# Patient Record
Sex: Male | Born: 1985 | Race: Black or African American | Hispanic: No | Marital: Single | Smoking: Current every day smoker
Health system: Southern US, Community
[De-identification: ages and names within clinical notes are randomized; demographics above are authoritative.]

## PROBLEM LIST (undated history)

## (undated) ENCOUNTER — Emergency Department (HOSPITAL_COMMUNITY): Payer: Self-pay | Source: Home / Self Care

## (undated) DIAGNOSIS — W3400XA Accidental discharge from unspecified firearms or gun, initial encounter: Secondary | ICD-10-CM

---
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
## 2011-02-07 ENCOUNTER — Emergency Department (HOSPITAL_COMMUNITY)
Admission: EM | Admit: 2011-02-07 | Discharge: 2011-02-07 | Disposition: A | Discharge status: Home / Self Care | Payer: Self-pay | Attending: Emergency Medicine | Admitting: Emergency Medicine

## 2011-02-07 ENCOUNTER — Encounter (HOSPITAL_COMMUNITY): Payer: Self-pay | Admitting: Radiology

## 2011-02-07 ENCOUNTER — Emergency Department (HOSPITAL_COMMUNITY): Payer: Self-pay

## 2011-02-07 DIAGNOSIS — M79609 Pain in unspecified limb: Secondary | ICD-10-CM | POA: Insufficient documentation

## 2011-02-07 DIAGNOSIS — M7989 Other specified soft tissue disorders: Secondary | ICD-10-CM | POA: Insufficient documentation

## 2011-02-07 DIAGNOSIS — S0990XA Unspecified injury of head, initial encounter: Secondary | ICD-10-CM | POA: Insufficient documentation

## 2011-02-07 DIAGNOSIS — M542 Cervicalgia: Secondary | ICD-10-CM | POA: Insufficient documentation

## 2011-02-07 DIAGNOSIS — J45909 Unspecified asthma, uncomplicated: Secondary | ICD-10-CM | POA: Insufficient documentation

## 2011-12-28 ENCOUNTER — Inpatient Hospital Stay (HOSPITAL_COMMUNITY)
Admission: EM | Admit: 2011-12-28 | Discharge: 2011-12-30 | DRG: 958 | Disposition: A | Discharge status: Home / Self Care | Payer: Self-pay | Attending: Surgery | Admitting: Surgery

## 2011-12-28 ENCOUNTER — Encounter (HOSPITAL_COMMUNITY): Admission: EM | Disposition: A | Discharge status: Home / Self Care | Payer: Self-pay | Source: Home / Self Care

## 2011-12-28 ENCOUNTER — Inpatient Hospital Stay (HOSPITAL_COMMUNITY): Payer: Self-pay

## 2011-12-28 ENCOUNTER — Emergency Department (HOSPITAL_COMMUNITY): Payer: Self-pay

## 2011-12-28 ENCOUNTER — Encounter (HOSPITAL_COMMUNITY): Payer: Self-pay | Admitting: Emergency Medicine

## 2011-12-28 ENCOUNTER — Inpatient Hospital Stay (HOSPITAL_COMMUNITY): Payer: Self-pay | Admitting: Anesthesiology

## 2011-12-28 ENCOUNTER — Encounter (HOSPITAL_COMMUNITY): Payer: Self-pay | Admitting: Anesthesiology

## 2011-12-28 DIAGNOSIS — S36113A Laceration of liver, unspecified degree, initial encounter: Secondary | ICD-10-CM | POA: Diagnosis present

## 2011-12-28 DIAGNOSIS — S838X9A Sprain of other specified parts of unspecified knee, initial encounter: Secondary | ICD-10-CM | POA: Diagnosis present

## 2011-12-28 DIAGNOSIS — S81809A Unspecified open wound, unspecified lower leg, initial encounter: Secondary | ICD-10-CM

## 2011-12-28 DIAGNOSIS — S91009A Unspecified open wound, unspecified ankle, initial encounter: Secondary | ICD-10-CM

## 2011-12-28 DIAGNOSIS — W3400XA Accidental discharge from unspecified firearms or gun, initial encounter: Secondary | ICD-10-CM

## 2011-12-28 DIAGNOSIS — S82009B Unspecified fracture of unspecified patella, initial encounter for open fracture type I or II: Principal | ICD-10-CM | POA: Diagnosis present

## 2011-12-28 DIAGNOSIS — S82009A Unspecified fracture of unspecified patella, initial encounter for closed fracture: Secondary | ICD-10-CM | POA: Diagnosis present

## 2011-12-28 DIAGNOSIS — I959 Hypotension, unspecified: Secondary | ICD-10-CM | POA: Diagnosis present

## 2011-12-28 DIAGNOSIS — S21109A Unspecified open wound of unspecified front wall of thorax without penetration into thoracic cavity, initial encounter: Secondary | ICD-10-CM | POA: Diagnosis present

## 2011-12-28 DIAGNOSIS — J45909 Unspecified asthma, uncomplicated: Secondary | ICD-10-CM | POA: Diagnosis present

## 2011-12-28 DIAGNOSIS — F17200 Nicotine dependence, unspecified, uncomplicated: Secondary | ICD-10-CM | POA: Diagnosis present

## 2011-12-28 DIAGNOSIS — R000 Tachycardia, unspecified: Secondary | ICD-10-CM | POA: Diagnosis present

## 2011-12-28 DIAGNOSIS — S31139A Puncture wound of abdominal wall without foreign body, unspecified quadrant without penetration into peritoneal cavity, initial encounter: Secondary | ICD-10-CM

## 2011-12-28 DIAGNOSIS — S81839A Puncture wound without foreign body, unspecified lower leg, initial encounter: Secondary | ICD-10-CM

## 2011-12-28 DIAGNOSIS — S81009A Unspecified open wound, unspecified knee, initial encounter: Secondary | ICD-10-CM

## 2011-12-28 HISTORY — PX: IRRIGATION AND DEBRIDEMENT KNEE: SHX5185

## 2011-12-28 HISTORY — PX: ORIF PATELLA: SHX5033

## 2011-12-28 LAB — CBC
HCT: 44.9 % (ref 39.0–52.0)
Platelets: 192 10*3/uL (ref 150–400)
RBC: 4.46 MIL/uL (ref 4.22–5.81)
RDW: 13 % (ref 11.5–15.5)
WBC: 10 10*3/uL (ref 4.0–10.5)

## 2011-12-28 LAB — COMPREHENSIVE METABOLIC PANEL
Alkaline Phosphatase: 71 U/L (ref 39–117)
BUN: 11 mg/dL (ref 6–23)
Calcium: 9.4 mg/dL (ref 8.4–10.5)
Creatinine, Ser: 1.18 mg/dL (ref 0.50–1.35)
GFR calc Af Amer: >90 mL/min (ref >90)
Glucose, Bld: 134 mg/dL — ABNORMAL HIGH (ref 70–99)
Potassium: 2.7 mEq/L — CL (ref 3.5–5.1)
Total Protein: 7.9 g/dL (ref 6.0–8.3)

## 2011-12-28 LAB — HEMOGLOBIN AND HEMATOCRIT, BLOOD
HCT: 44.2 % (ref 39.0–52.0)
HCT: 48 % (ref 39.0–52.0)
Hemoglobin: 15.2 g/dL (ref 13.0–17.0)
Hemoglobin: 16 g/dL (ref 13.0–17.0)

## 2011-12-28 LAB — URINALYSIS, MICROSCOPIC ONLY
Hgb urine dipstick: NEGATIVE
Nitrite: NEGATIVE
Specific Gravity, Urine: 1.005 (ref 1.005–1.030)
Urobilinogen, UA: 0.2 mg/dL (ref 0.0–1.0)

## 2011-12-28 LAB — POCT I-STAT, CHEM 8
BUN: 10 mg/dL (ref 6–23)
Calcium, Ion: 1.11 mmol/L — ABNORMAL LOW (ref 1.12–1.32)
Creatinine, Ser: 1.3 mg/dL (ref 0.50–1.35)
Glucose, Bld: 133 mg/dL — ABNORMAL HIGH (ref 70–99)
TCO2: 14 mmol/L (ref 0–100)

## 2011-12-28 LAB — MRSA PCR SCREENING: MRSA by PCR: NEGATIVE

## 2011-12-28 SURGERY — LAPAROTOMY, EXPLORATORY
Anesthesia: General

## 2011-12-28 SURGERY — OPEN REDUCTION INTERNAL FIXATION (ORIF) PATELLA
Anesthesia: General | Site: Knee | Laterality: Right | Wound class: Clean

## 2011-12-28 MED ORDER — HYDROMORPHONE HCL PF 1 MG/ML IJ SOLN
1.0000 mg | INTRAMUSCULAR | Status: DC | PRN
  Administered 2011-12-28 (×4): 1 mg via INTRAVENOUS
  Administered 2011-12-28 (×2): 2 mg via INTRAVENOUS
  Administered 2011-12-29 (×4): 1 mg via INTRAVENOUS
  Filled 2011-12-28 (×6): qty 1
  Filled 2011-12-28 (×2): qty 2
  Filled 2011-12-28 (×2): qty 1

## 2011-12-28 MED ORDER — SODIUM CHLORIDE 0.9 % IJ SOLN
INTRAMUSCULAR | Status: AC
  Administered 2011-12-28: 13:00:00
  Filled 2011-12-28: qty 10

## 2011-12-28 MED ORDER — SODIUM CHLORIDE 0.9 % IV SOLN
10.0000 mg | INTRAVENOUS | Status: DC | PRN
  Administered 2011-12-28: 50 ug/min via INTRAVENOUS

## 2011-12-28 MED ORDER — ETOMIDATE 2 MG/ML IV SOLN
INTRAVENOUS | Status: AC
  Filled 2011-12-28: qty 20

## 2011-12-28 MED ORDER — IOHEXOL 300 MG/ML  SOLN
125.0000 mL | Freq: Once | INTRAMUSCULAR | Status: AC | PRN
  Administered 2011-12-28: 125 mL via INTRAVENOUS

## 2011-12-28 MED ORDER — ONDANSETRON HCL 4 MG PO TABS: 4.0000 mg | ORAL_TABLET | Freq: Four times a day (QID) | ORAL | Status: DC | PRN

## 2011-12-28 MED ORDER — ROCURONIUM BROMIDE 50 MG/5ML IV SOLN
INTRAVENOUS | Status: AC
  Filled 2011-12-28: qty 2

## 2011-12-28 MED ORDER — PHENYLEPHRINE HCL 10 MG/ML IJ SOLN
INTRAMUSCULAR | Status: DC | PRN
  Administered 2011-12-28 (×2): 80 ug via INTRAVENOUS

## 2011-12-28 MED ORDER — TETANUS-DIPHTH-ACELL PERTUSSIS 5-2.5-18.5 LF-MCG/0.5 IM SUSP
INTRAMUSCULAR | Status: AC
  Filled 2011-12-28: qty 0.5

## 2011-12-28 MED ORDER — CEFAZOLIN SODIUM 1-5 GM-% IV SOLN
1.0000 g | Freq: Four times a day (QID) | INTRAVENOUS | Status: AC
  Administered 2011-12-28 – 2011-12-29 (×3): 1 g via INTRAVENOUS
  Filled 2011-12-28 (×4): qty 50

## 2011-12-28 MED ORDER — LACTATED RINGERS IV SOLN
INTRAVENOUS | Status: DC | PRN
  Administered 2011-12-28 (×2): via INTRAVENOUS

## 2011-12-28 MED ORDER — LIDOCAINE HCL (CARDIAC) 20 MG/ML IV SOLN
INTRAVENOUS | Status: AC
  Filled 2011-12-28: qty 5

## 2011-12-28 MED ORDER — PANTOPRAZOLE SODIUM 40 MG IV SOLR
40.0000 mg | Freq: Every day | INTRAVENOUS | Status: DC
  Administered 2011-12-28: 40 mg via INTRAVENOUS
  Filled 2011-12-28 (×2): qty 40

## 2011-12-28 MED ORDER — ACETAMINOPHEN 10 MG/ML IV SOLN
INTRAVENOUS | Status: DC | PRN
  Administered 2011-12-28: 1000 mg via INTRAVENOUS

## 2011-12-28 MED ORDER — ONDANSETRON HCL 4 MG/2ML IJ SOLN: 4.0000 mg | Freq: Four times a day (QID) | INTRAMUSCULAR | Status: DC | PRN

## 2011-12-28 MED ORDER — LIDOCAINE VISCOUS 2 % MT SOLN
OROMUCOSAL | Status: AC
  Filled 2011-12-28: qty 15

## 2011-12-28 MED ORDER — CEFAZOLIN SODIUM 1-5 GM-% IV SOLN
INTRAVENOUS | Status: AC
  Filled 2011-12-28: qty 100

## 2011-12-28 MED ORDER — NICOTINE 14 MG/24HR TD PT24
14.0000 mg | MEDICATED_PATCH | Freq: Every day | TRANSDERMAL | Status: DC
  Administered 2011-12-28 – 2011-12-29 (×2): 14 mg via TRANSDERMAL
  Filled 2011-12-28 (×3): qty 1

## 2011-12-28 MED ORDER — CEFAZOLIN SODIUM 1-5 GM-% IV SOLN
1.0000 g | Freq: Three times a day (TID) | INTRAVENOUS | Status: DC
  Filled 2011-12-28 (×2): qty 50

## 2011-12-28 MED ORDER — LIDOCAINE HCL (CARDIAC) 20 MG/ML IV SOLN
INTRAVENOUS | Status: DC | PRN
  Administered 2011-12-28: 100 mg via INTRAVENOUS

## 2011-12-28 MED ORDER — HYDROMORPHONE HCL PF 1 MG/ML IJ SOLN: 0.2500 mg | INTRAMUSCULAR | Status: DC | PRN

## 2011-12-28 MED ORDER — FENTANYL CITRATE 0.05 MG/ML IJ SOLN
INTRAMUSCULAR | Status: DC | PRN
  Administered 2011-12-28: 50 ug via INTRAVENOUS
  Administered 2011-12-28: 200 ug via INTRAVENOUS

## 2011-12-28 MED ORDER — PROPOFOL 10 MG/ML IV EMUL
INTRAVENOUS | Status: DC | PRN
  Administered 2011-12-28: 200 mg via INTRAVENOUS

## 2011-12-28 MED ORDER — ONDANSETRON HCL 4 MG/2ML IJ SOLN: 4.0000 mg | Freq: Once | INTRAMUSCULAR | Status: DC | PRN

## 2011-12-28 MED ORDER — PANTOPRAZOLE SODIUM 40 MG PO TBEC: 40.0000 mg | DELAYED_RELEASE_TABLET | Freq: Every day | ORAL | Status: DC

## 2011-12-28 MED ORDER — MORPHINE SULFATE 10 MG/ML IJ SOLN
INTRAMUSCULAR | Status: DC | PRN
  Administered 2011-12-28: 2 mg via INTRAVENOUS
  Administered 2011-12-28: 4 mg via INTRAVENOUS

## 2011-12-28 MED ORDER — SUCCINYLCHOLINE CHLORIDE 20 MG/ML IJ SOLN
INTRAMUSCULAR | Status: DC | PRN
  Administered 2011-12-28: 10 mg via INTRAVENOUS

## 2011-12-28 MED ORDER — SUCCINYLCHOLINE CHLORIDE 20 MG/ML IJ SOLN
INTRAMUSCULAR | Status: AC
  Filled 2011-12-28: qty 10

## 2011-12-28 MED ORDER — SODIUM CHLORIDE 0.9 % IR SOLN
Status: DC | PRN
  Administered 2011-12-28: 1000 mL

## 2011-12-28 SURGICAL SUPPLY — 74 items
BANDAGE ESMARK 6X9 LF (GAUZE/BANDAGES/DRESSINGS) ×1 IMPLANT
BANDAGE GAUZE ELAST BULKY 4 IN (GAUZE/BANDAGES/DRESSINGS) ×3 IMPLANT
BLADE CUDA 5.5 (BLADE) IMPLANT
BLADE GREAT WHITE 4.2 (BLADE) IMPLANT
BLADE SURG 10 STRL SS (BLADE) ×3 IMPLANT
BNDG CMPR 9X6 STRL LF SNTH (GAUZE/BANDAGES/DRESSINGS) ×2
BNDG CMPR MED 15X6 ELC VLCR LF (GAUZE/BANDAGES/DRESSINGS) ×2
BNDG COHESIVE 4X5 TAN STRL (GAUZE/BANDAGES/DRESSINGS) ×3 IMPLANT
BNDG COHESIVE 6X5 TAN STRL LF (GAUZE/BANDAGES/DRESSINGS) ×3 IMPLANT
BNDG ELASTIC 6X15 VLCR STRL LF (GAUZE/BANDAGES/DRESSINGS) ×3 IMPLANT
BNDG ESMARK 6X9 LF (GAUZE/BANDAGES/DRESSINGS) ×3
BUR OVAL 6.0 (BURR) IMPLANT
CHLORAPREP W/TINT 26ML (MISCELLANEOUS) ×3 IMPLANT
CLOTH BEACON ORANGE TIMEOUT ST (SAFETY) ×3 IMPLANT
COVER SURGICAL LIGHT HANDLE (MISCELLANEOUS) ×3 IMPLANT
CUFF TOURNIQUET SINGLE 34IN LL (TOURNIQUET CUFF) IMPLANT
CUFF TOURNIQUET SINGLE 44IN (TOURNIQUET CUFF) IMPLANT
DRAPE ARTHROSCOPY W/POUCH 114 (DRAPES) ×3 IMPLANT
DRAPE C-ARM 42X72 X-RAY (DRAPES) ×2 IMPLANT
DRAPE C-ARMOR (DRAPES) ×2 IMPLANT
DRAPE SURG 17X11 SM STRL (DRAPES) ×2 IMPLANT
DRAPE SURG 17X23 STRL (DRAPES) ×3 IMPLANT
DRAPE U-SHAPE 47X51 STRL (DRAPES) ×3 IMPLANT
DRSG ADAPTIC 3X8 NADH LF (GAUZE/BANDAGES/DRESSINGS) ×3 IMPLANT
DRSG EMULSION OIL 3X3 NADH (GAUZE/BANDAGES/DRESSINGS) ×3 IMPLANT
DRSG PAD ABDOMINAL 8X10 ST (GAUZE/BANDAGES/DRESSINGS) ×4 IMPLANT
ELECT REM PT RETURN 9FT ADLT (ELECTROSURGICAL) ×3
ELECTRODE REM PT RTRN 9FT ADLT (ELECTROSURGICAL) ×2 IMPLANT
GLOVE BIO SURGEON STRL SZ 6.5 (GLOVE) ×3 IMPLANT
GLOVE BIO SURGEON STRL SZ7 (GLOVE) ×3 IMPLANT
GLOVE BIO SURGEON STRL SZ7.5 (GLOVE) ×3 IMPLANT
GLOVE BIOGEL PI IND STRL 7.0 (GLOVE) ×2 IMPLANT
GLOVE BIOGEL PI IND STRL 7.5 (GLOVE) ×2 IMPLANT
GLOVE BIOGEL PI IND STRL 8 (GLOVE) ×2 IMPLANT
GLOVE BIOGEL PI INDICATOR 7.0 (GLOVE) ×1
GLOVE BIOGEL PI INDICATOR 7.5 (GLOVE) ×1
GLOVE BIOGEL PI INDICATOR 8 (GLOVE) ×1
GOWN PREVENTION PLUS LG XLONG (DISPOSABLE) ×3 IMPLANT
GOWN PREVENTION PLUS XLARGE (GOWN DISPOSABLE) ×3 IMPLANT
GOWN STRL NON-REIN LRG LVL3 (GOWN DISPOSABLE) ×6 IMPLANT
HANDPIECE INTERPULSE COAX TIP (DISPOSABLE) ×3
IMMOBILIZER KNEE 22 UNIV (SOFTGOODS) ×2 IMPLANT
IMMOBILIZER KNEE 24 THIGH 36 (MISCELLANEOUS) IMPLANT
IMMOBILIZER KNEE 24 UNIV (MISCELLANEOUS)
KIT BASIN OR (CUSTOM PROCEDURE TRAY) ×3 IMPLANT
KIT ROOM TURNOVER OR (KITS) ×3 IMPLANT
MANIFOLD NEPTUNE II (INSTRUMENTS) ×3 IMPLANT
NDL 18GX1X1/2 (RX/OR ONLY) (NEEDLE) ×1 IMPLANT
NEEDLE 18GX1X1/2 (RX/OR ONLY) (NEEDLE) ×3 IMPLANT
NS IRRIG 1000ML POUR BTL (IV SOLUTION) ×3 IMPLANT
PACK ARTHROSCOPY DSU (CUSTOM PROCEDURE TRAY) ×3 IMPLANT
PACK ORTHO EXTREMITY (CUSTOM PROCEDURE TRAY) ×3 IMPLANT
PAD ARMBOARD 7.5X6 YLW CONV (MISCELLANEOUS) ×6 IMPLANT
PADDING CAST COTTON 6X4 STRL (CAST SUPPLIES) ×3 IMPLANT
RETRIEVER SUT HEWSON (MISCELLANEOUS) ×2 IMPLANT
SET HNDPC FAN SPRY TIP SCT (DISPOSABLE) ×2 IMPLANT
SPONGE GAUZE 4X4 12PLY (GAUZE/BANDAGES/DRESSINGS) ×3 IMPLANT
SPONGE LAP 18X18 X RAY DECT (DISPOSABLE) ×6 IMPLANT
SPONGE LAP 4X18 X RAY DECT (DISPOSABLE) ×3 IMPLANT
STAPLER VISISTAT 35W (STAPLE) ×5 IMPLANT
STOCKINETTE IMPERVIOUS 9X36 MD (GAUZE/BANDAGES/DRESSINGS) ×3 IMPLANT
SUT ETHILON 4 0 PS 2 18 (SUTURE) ×3 IMPLANT
SUT VIC AB 0 CT1 27 (SUTURE) ×3
SUT VIC AB 0 CT1 27XBRD ANBCTR (SUTURE) ×1 IMPLANT
SUT VIC AB 2-0 CT1 27 (SUTURE) ×3
SUT VIC AB 2-0 CT1 TAPERPNT 27 (SUTURE) ×1 IMPLANT
SYR 20CC LL (SYRINGE) ×3 IMPLANT
TOWEL OR 17X24 6PK STRL BLUE (TOWEL DISPOSABLE) ×6 IMPLANT
TOWEL OR 17X26 10 PK STRL BLUE (TOWEL DISPOSABLE) ×3 IMPLANT
TUBE ANAEROBIC SPECIMEN COL (MISCELLANEOUS) IMPLANT
TUBE CONNECTING 12X1/4 (SUCTIONS) ×3 IMPLANT
WAND 90 DEG TURBOVAC W/CORD (SURGICAL WAND) IMPLANT
WATER STERILE IRR 1000ML POUR (IV SOLUTION) ×3 IMPLANT
YANKAUER SUCT BULB TIP NO VENT (SUCTIONS) ×3 IMPLANT

## 2011-12-28 SURGICAL SUPPLY — 35 items
BLADE SURG ROTATE 9660 (MISCELLANEOUS) IMPLANT
CANISTER SUCTION 2500CC (MISCELLANEOUS) ×3 IMPLANT
CHLORAPREP W/TINT 26ML (MISCELLANEOUS) ×3 IMPLANT
CLOTH BEACON ORANGE TIMEOUT ST (SAFETY) ×3 IMPLANT
DRAPE LAPAROSCOPIC ABDOMINAL (DRAPES) ×3 IMPLANT
DRAPE UTILITY 15X26 W/TAPE STR (DRAPE) ×6 IMPLANT
DRAPE WARM FLUID 44X44 (DRAPE) ×3 IMPLANT
ELECT CAUTERY BLADE 6.4 (BLADE) IMPLANT
ELECT REM PT RETURN 9FT ADLT (ELECTROSURGICAL) ×2
ELECTRODE REM PT RTRN 9FT ADLT (ELECTROSURGICAL) ×2 IMPLANT
GLOVE EUDERMIC 7 POWDERFREE (GLOVE) ×3 IMPLANT
GOWN PREVENTION PLUS XLARGE (GOWN DISPOSABLE) ×3 IMPLANT
GOWN STRL NON-REIN LRG LVL3 (GOWN DISPOSABLE) ×6 IMPLANT
KIT BASIN OR (CUSTOM PROCEDURE TRAY) ×3 IMPLANT
KIT ROOM TURNOVER OR (KITS) ×3 IMPLANT
LIGASURE IMPACT 36 18CM CVD LR (INSTRUMENTS) IMPLANT
NS IRRIG 1000ML POUR BTL (IV SOLUTION) ×3 IMPLANT
PACK GENERAL/GYN (CUSTOM PROCEDURE TRAY) ×3 IMPLANT
PAD ARMBOARD 7.5X6 YLW CONV (MISCELLANEOUS) ×6 IMPLANT
SPECIMEN JAR X LARGE (MISCELLANEOUS) IMPLANT
SPONGE GAUZE 4X4 12PLY (GAUZE/BANDAGES/DRESSINGS) ×3 IMPLANT
SPONGE LAP 18X18 X RAY DECT (DISPOSABLE) IMPLANT
STAPLER VISISTAT 35W (STAPLE) ×3 IMPLANT
SUCTION POOLE TIP (SUCTIONS) IMPLANT
SUT PDS AB 1 TP1 96 (SUTURE) ×6 IMPLANT
SUT SILK 2 0 SH CR/8 (SUTURE) ×3 IMPLANT
SUT SILK 2 0 TIES 10X30 (SUTURE) ×3 IMPLANT
SUT SILK 3 0 SH CR/8 (SUTURE) ×3 IMPLANT
SUT SILK 3 0 TIES 10X30 (SUTURE) ×3 IMPLANT
SUT VIC AB 3-0 SH 18 (SUTURE) IMPLANT
TOWEL OR 17X24 6PK STRL BLUE (TOWEL DISPOSABLE) ×3 IMPLANT
TOWEL OR 17X26 10 PK STRL BLUE (TOWEL DISPOSABLE) ×3 IMPLANT
TRAY FOLEY CATH 14FRSI W/METER (CATHETERS) IMPLANT
WATER STERILE IRR 1000ML POUR (IV SOLUTION) ×3 IMPLANT
YANKAUER SUCT BULB TIP NO VENT (SUCTIONS) IMPLANT
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
## 2011-12-28 NOTE — Anesthesia Procedure Notes (Signed)
Procedure Name: Intubation Date/Time: 12/28/2011 7:48 AM Performed by: Carmela Rima Pre-anesthesia Checklist: Patient identified, Emergency Drugs available, Suction available, Patient being monitored and Timeout performed Patient Re-evaluated:Patient Re-evaluated prior to inductionOxygen Delivery Method: Circle system utilized Preoxygenation: Pre-oxygenation with 100% oxygen Intubation Type: IV induction, Rapid sequence and Cricoid Pressure applied Laryngoscope Size: Mac and 4 Grade View: Grade II Tube type: Oral Tube size: 7.5 mm Number of attempts: 2 Placement Confirmation: ETT inserted through vocal cords under direct vision,  breath sounds checked- equal and bilateral,  positive ETCO2 and CO2 detector Secured at: 23 cm Tube secured with: Tape Dental Injury: Teeth and Oropharynx as per pre-operative assessment

## 2011-12-28 NOTE — Anesthesia Postprocedure Evaluation (Signed)
  Anesthesia Post-op Note  Patient: Calvin [name]  Procedure(s) Performed: Procedure(s) (LRB): IRRIGATION AND DEBRIDEMENT KNEE (Right) OPEN REDUCTION INTERNAL (ORIF) FIXATION PATELLA (Right)  Patient Location: PACU  Anesthesia Type: General  Level of Consciousness: awake, alert  and oriented  Airway and Oxygen Therapy: Patient Spontanous Breathing  Post-op Pain: mild  Post-op Assessment: Post-op Vital signs reviewed and Patient's Cardiovascular Status Stable  Post-op Vital Signs: stable  Complications: No apparent anesthesia complications

## 2011-12-28 NOTE — ED Notes (Signed)
Dr. Lindie Spruce notified about critical potassium.
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
## 2011-12-28 NOTE — Transfer of Care (Signed)
Immediate Anesthesia Transfer of Care Note  Patient: Calvin [name]  Procedure(s) Performed: Procedure(s) (LRB): IRRIGATION AND DEBRIDEMENT KNEE (Right) OPEN REDUCTION INTERNAL (ORIF) FIXATION PATELLA (Right)  Patient Location: PACU  Anesthesia Type: General  Level of Consciousness: awake, alert  and oriented  Airway & Oxygen Therapy: Patient Spontanous Breathing and Patient connected to face mask oxygen  Post-op Assessment: Report given to PACU RN, Post -op Vital signs reviewed and stable and Patient moving all extremities  Post vital signs: Reviewed and stable  Complications: No apparent anesthesia complications

## 2011-12-28 NOTE — ED Notes (Signed)
Emergency Release for blood product administration.  Verbal order received by Dr. Lindie Spruce to infuse 2 units  If BP drops below 90 systolic.  Pt arrived in CT - Pressure dropped 88/70 and initiated administration of first unit of PRBC - verified by second nurse: Martie Lee, RN  Unit # [id number]

## 2011-12-28 NOTE — ED Notes (Signed)
Dr. Lindie Spruce at bedside and performing FAST

## 2011-12-28 NOTE — Preoperative (Signed)
Beta Blockers   Reason not to administer Beta Blockers:Not Applicable 

## 2011-12-28 NOTE — ED Notes (Addendum)
Bag #1 One pair of sneakers Bag #2 Jeans and boxer shorts Bag #3 1 T-Shirt Bag #4 $1.61, license, 1 pair white socks
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
## 2011-12-28 NOTE — ED Notes (Signed)
See trauma narrator 
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
## 2011-12-28 NOTE — ED Notes (Signed)
Emergency Release second unit - Verified by second nurse Ian Malkin, RN and release signed by Dr. Lindie Spruce

## 2011-12-29 ENCOUNTER — Encounter (HOSPITAL_COMMUNITY): Payer: Self-pay | Admitting: Orthopedic Surgery

## 2011-12-29 ENCOUNTER — Inpatient Hospital Stay (HOSPITAL_COMMUNITY): Payer: Self-pay

## 2011-12-29 DIAGNOSIS — S36113A Laceration of liver, unspecified degree, initial encounter: Secondary | ICD-10-CM | POA: Diagnosis present

## 2011-12-29 DIAGNOSIS — S82009A Unspecified fracture of unspecified patella, initial encounter for closed fracture: Secondary | ICD-10-CM | POA: Diagnosis present

## 2011-12-29 DIAGNOSIS — S21339A Puncture wound without foreign body of unspecified front wall of thorax with penetration into thoracic cavity, initial encounter: Secondary | ICD-10-CM

## 2011-12-29 DIAGNOSIS — W3400XA Accidental discharge from unspecified firearms or gun, initial encounter: Secondary | ICD-10-CM

## 2011-12-29 DIAGNOSIS — S81039A Puncture wound without foreign body, unspecified knee, initial encounter: Secondary | ICD-10-CM

## 2011-12-29 LAB — TYPE AND SCREEN
Antibody Screen: NEGATIVE
Unit division: 0
Unit division: 0
Unit division: 0

## 2011-12-29 LAB — CBC
MCH: 33.9 pg (ref 26.0–34.0)
MCHC: 35 g/dL (ref 30.0–36.0)
Platelets: 139 10*3/uL — ABNORMAL LOW (ref 150–400)

## 2011-12-29 LAB — BASIC METABOLIC PANEL
Calcium: 8.8 mg/dL (ref 8.4–10.5)
Creatinine, Ser: 0.87 mg/dL (ref 0.50–1.35)
GFR calc non Af Amer: >90 mL/min (ref >90)
Sodium: 133 mEq/L — ABNORMAL LOW (ref 135–145)

## 2011-12-29 MED ORDER — HYDROMORPHONE HCL PF 1 MG/ML IJ SOLN
1.0000 mg | INTRAMUSCULAR | Status: DC | PRN
  Administered 2011-12-29: 1 mg via INTRAVENOUS
  Filled 2011-12-29: qty 1

## 2011-12-29 MED ORDER — TRAMADOL HCL 50 MG PO TABS
100.0000 mg | ORAL_TABLET | Freq: Four times a day (QID) | ORAL | Status: DC
  Administered 2011-12-29 – 2011-12-30 (×3): 100 mg via ORAL
  Filled 2011-12-29 (×4): qty 2

## 2011-12-29 MED ORDER — SODIUM CHLORIDE 0.9 % IJ SOLN: 3.0000 mL | INTRAMUSCULAR | Status: DC | PRN

## 2011-12-29 MED ORDER — OXYCODONE HCL 5 MG PO TABS
5.0000 mg | ORAL_TABLET | ORAL | Status: DC | PRN
  Administered 2011-12-29 (×2): 15 mg via ORAL
  Administered 2011-12-29: 10 mg via ORAL
  Administered 2011-12-30: 15 mg via ORAL
  Filled 2011-12-29: qty 3
  Filled 2011-12-29: qty 2
  Filled 2011-12-29 (×2): qty 3

## 2011-12-29 MED ORDER — HYDROCODONE-ACETAMINOPHEN 5-325 MG PO TABS
1.0000 | ORAL_TABLET | ORAL | Status: DC | PRN
  Administered 2011-12-29: 2 via ORAL
  Filled 2011-12-29: qty 2
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
## 2011-12-29 NOTE — Progress Notes (Signed)
UR complete 
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
## 2011-12-30 LAB — CBC
Platelets: 131 10*3/uL — ABNORMAL LOW (ref 150–400)
RBC: 4.54 MIL/uL (ref 4.22–5.81)
RDW: 13.9 % (ref 11.5–15.5)
WBC: 11.3 10*3/uL — ABNORMAL HIGH (ref 4.0–10.5)

## 2011-12-30 LAB — DIFFERENTIAL
Basophils Absolute: 0 10*3/uL (ref 0.0–0.1)
Lymphocytes Relative: 10 % — ABNORMAL LOW (ref 12–46)
Lymphs Abs: 1.1 10*3/uL (ref 0.7–4.0)
Neutro Abs: 8.8 10*3/uL — ABNORMAL HIGH (ref 1.7–7.7)
Neutrophils Relative %: 78 % — ABNORMAL HIGH (ref 43–77)

## 2011-12-30 MED ORDER — TRAMADOL HCL 50 MG PO TABS: 100.0000 mg | ORAL_TABLET | Freq: Four times a day (QID) | ORAL | Status: AC

## 2011-12-30 MED ORDER — OXYCODONE-ACETAMINOPHEN 7.5-325 MG PO TABS: 1.0000 | ORAL_TABLET | ORAL | Status: AC | PRN
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
## 2012-01-12 ENCOUNTER — Emergency Department (INDEPENDENT_AMBULATORY_CARE_PROVIDER_SITE_OTHER)
Admission: EM | Admit: 2012-01-12 | Discharge: 2012-01-12 | Disposition: A | Discharge status: Home / Self Care | Payer: Self-pay | Source: Home / Self Care | Attending: Emergency Medicine | Admitting: Emergency Medicine

## 2012-01-12 ENCOUNTER — Encounter (HOSPITAL_COMMUNITY): Payer: Self-pay

## 2012-01-12 DIAGNOSIS — S81009A Unspecified open wound, unspecified knee, initial encounter: Secondary | ICD-10-CM

## 2012-01-12 DIAGNOSIS — S81809A Unspecified open wound, unspecified lower leg, initial encounter: Secondary | ICD-10-CM

## 2012-01-12 DIAGNOSIS — W3400XA Accidental discharge from unspecified firearms or gun, initial encounter: Secondary | ICD-10-CM
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
## 2012-01-12 NOTE — ED Notes (Signed)
15 staples intact over rt knee.  Skin edges well approximated, no redness, swelling or drainage noted.   Asking if we will numb the area to remove them. Dr Lorenz Coaster in to evaluate
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
## 2012-01-19 ENCOUNTER — Telehealth (HOSPITAL_COMMUNITY): Payer: Self-pay | Admitting: *Deleted

## 2012-01-19 NOTE — ED Notes (Signed)
Pt. called on VM @ 1052 and said he was supposed to f/u with Dr. Ave Filter but does not know his phone number. I called back and gave him the address and phone number for Dr. Jones Broom the orthopedist.  He asked if I was sure that was who he was supposed to see. While I accessed the chart, either the call was dropped or pt. hung up. I verified he was supposed to f/u with Dr. Ave Filter today. I called back twice and left a message to call. Vassie Moselle 01/19/2012

## 2012-02-23 ENCOUNTER — Ambulatory Visit: Payer: Self-pay | Attending: Orthopedic Surgery

## 2012-02-23 DIAGNOSIS — IMO0001 Reserved for inherently not codable concepts without codable children: Secondary | ICD-10-CM | POA: Insufficient documentation

## 2012-02-23 DIAGNOSIS — R262 Difficulty in walking, not elsewhere classified: Secondary | ICD-10-CM | POA: Insufficient documentation

## 2012-02-23 DIAGNOSIS — M25569 Pain in unspecified knee: Secondary | ICD-10-CM | POA: Insufficient documentation

## 2012-02-23 DIAGNOSIS — M25669 Stiffness of unspecified knee, not elsewhere classified: Secondary | ICD-10-CM | POA: Insufficient documentation

## 2012-03-04 ENCOUNTER — Ambulatory Visit: Payer: Self-pay | Admitting: Physical Therapy

## 2012-03-05 ENCOUNTER — Ambulatory Visit: Payer: Self-pay | Admitting: Physical Therapy

## 2012-03-12 ENCOUNTER — Ambulatory Visit: Payer: Self-pay

## 2012-03-16 ENCOUNTER — Ambulatory Visit: Payer: Self-pay

## 2012-03-17 ENCOUNTER — Ambulatory Visit: Payer: Self-pay | Admitting: Physical Therapy

## 2013-10-02 ENCOUNTER — Emergency Department (HOSPITAL_COMMUNITY): Payer: Self-pay

## 2013-10-02 ENCOUNTER — Emergency Department (HOSPITAL_COMMUNITY)
Admission: EM | Admit: 2013-10-02 | Discharge: 2013-10-02 | Disposition: A | Discharge status: Home / Self Care | Payer: Self-pay | Attending: Emergency Medicine | Admitting: Emergency Medicine

## 2013-10-02 ENCOUNTER — Encounter (HOSPITAL_COMMUNITY): Payer: Self-pay | Admitting: Emergency Medicine

## 2013-10-02 DIAGNOSIS — S0180XA Unspecified open wound of other part of head, initial encounter: Secondary | ICD-10-CM | POA: Insufficient documentation

## 2013-10-02 DIAGNOSIS — IMO0002 Reserved for concepts with insufficient information to code with codable children: Secondary | ICD-10-CM | POA: Insufficient documentation

## 2013-10-02 DIAGNOSIS — J45909 Unspecified asthma, uncomplicated: Secondary | ICD-10-CM | POA: Insufficient documentation

## 2013-10-02 DIAGNOSIS — S01111A Laceration without foreign body of right eyelid and periocular area, initial encounter: Secondary | ICD-10-CM

## 2013-10-02 DIAGNOSIS — S0181XA Laceration without foreign body of other part of head, initial encounter: Secondary | ICD-10-CM

## 2013-10-02 DIAGNOSIS — S01409A Unspecified open wound of unspecified cheek and temporomandibular area, initial encounter: Secondary | ICD-10-CM | POA: Insufficient documentation

## 2013-10-02 DIAGNOSIS — S0990XA Unspecified injury of head, initial encounter: Secondary | ICD-10-CM

## 2013-10-02 DIAGNOSIS — F17200 Nicotine dependence, unspecified, uncomplicated: Secondary | ICD-10-CM | POA: Insufficient documentation

## 2013-10-02 DIAGNOSIS — S0510XA Contusion of eyeball and orbital tissues, unspecified eye, initial encounter: Secondary | ICD-10-CM | POA: Insufficient documentation

## 2013-10-02 DIAGNOSIS — F1010 Alcohol abuse, uncomplicated: Secondary | ICD-10-CM | POA: Insufficient documentation

## 2013-10-02 LAB — CBG MONITORING, ED: Glucose-Capillary: 91 mg/dL (ref 70–99)

## 2013-10-02 MED ORDER — ACETAMINOPHEN 325 MG PO TABS
650.0000 mg | ORAL_TABLET | Freq: Once | ORAL | Status: DC
  Filled 2013-10-02: qty 2

## 2013-10-02 MED ORDER — TETANUS-DIPHTHERIA TOXOIDS TD 5-2 LFU IM INJ
0.5000 mL | INJECTION | Freq: Once | INTRAMUSCULAR | Status: DC
  Filled 2013-10-02: qty 0.5

## 2013-10-02 MED ORDER — HYDROCODONE-ACETAMINOPHEN 5-325 MG PO TABS: 1.0000 | ORAL_TABLET | ORAL | Status: DC | PRN

## 2013-10-02 NOTE — ED Notes (Signed)
Dr. Norlene Campbelltter desires no piv at present.
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
## 2013-10-02 NOTE — Discharge Instructions (Signed)
Keep wounds clean and dry - wash daily with gentle soap and water and keep covered Take Vicodin as needed for severe pain - Please be careful with this medication.  It can cause drowsiness.  Use caution while driving, operating machinery, drinking alcohol, or any other activities that may impair your physical or mental abilities.   Take Ibuprofen 600-800 mg every 6-8 hours as needed for pain  Use the RICE method - see below (apply cold compresses - this will help with pain and swelling)  Return to the emergency department if you develop any changing/worsening condition, confusion, severe headache, difficulty walking/speaking, weakness, abdominal pain, blood in your urine/stool, repeated vomiting, feeling very lethargic, or any other concerns (please read additional information regarding your condition below)     Head Injury, Adult You have received a head injury. It does not appear serious at this time. Headaches and vomiting are common following head injury. It should be easy to awaken from sleeping. Sometimes it is necessary for you to stay in the emergency department for a while for observation. Sometimes admission to the hospital may be needed. After injuries such as yours, most problems occur within the first 24 hours, but side effects may occur up to 7 10 days after the injury. It is important for you to carefully monitor your condition and contact your health care provider or seek immediate medical care if there is a change in your condition. WHAT ARE THE TYPES OF HEAD INJURIES? Head injuries can be as minor as a bump. Some head injuries can be more severe. More severe head injuries include:  A jarring injury to the brain (concussion).  A bruise of the brain (contusion). This mean there is bleeding in the brain that can cause swelling.  A cracked skull (skull fracture).  Bleeding in the brain that collects, clots, and forms a bump (hematoma). WHAT CAUSES A HEAD INJURY? A serious head  injury is most likely to happen to someone who is in a car wreck and is not wearing a seat belt. Other causes of major head injuries include bicycle or motorcycle accidents, sports injuries, and falls. HOW ARE HEAD INJURIES DIAGNOSED? A complete history of the event leading to the injury and your current symptoms will be helpful in diagnosing head injuries. Many times, pictures of the brain, such as CT or MRI are needed to see the extent of the injury. Often, an overnight hospital stay is necessary for observation.  WHEN SHOULD I SEEK IMMEDIATE MEDICAL CARE?  You should get help right away if:  You have confusion or drowsiness.  You feel sick to your stomach (nauseous) or have continued, forceful vomiting.  You have dizziness or unsteadiness that is getting worse.  You have severe, continued headaches not relieved by medicine. Only take over-the-counter or prescription medicines for pain, fever, or discomfort as directed by your health care provider.  You do not have normal function of the arms or legs or are unable to walk.  You notice changes in the black spots in the center of the colored part of your eye (pupil).  You have a clear or bloody fluid coming from your nose or ears.  You have a loss of vision. During the next 24 hours after the injury, you must stay with someone who can watch you for the warning signs. This person should contact local emergency services (911 in the [place].S[place] if you have seizures, you become unconscious, or you are unable to wake up. HOW CAN I PREVENT  A HEAD INJURY IN THE FUTURE? The most important factor for preventing major head injuries is avoiding motor vehicle accidents. To minimize the potential for damage to your head, it is crucial to wear seat belts while riding in motor vehicles. Wearing helmets while bike riding and playing collision sports (like football) is also helpful. Also, avoiding dangerous activities around the house will further help reduce your  risk of head injury.  WHEN CAN I RETURN TO NORMAL ACTIVITIES AND ATHLETICS? You should be reevaluated by your health care provider before returning to these activities. If you have any of the following symptoms, you should not return to activities or contact sports until 1 week after the symptoms have stopped:  Persistent headache.  Dizziness or vertigo.  Poor attention and concentration.  Confusion.  Memory problems.  Nausea or vomiting.  Fatigue or tire easily.  Irritability.  Intolerant of bright lights or loud noises.  Anxiety or depression.  Disturbed sleep. MAKE SURE YOU:   Understand these instructions.  Will watch your condition.  Will get help right away if you are not doing well or get worse. Document Released: 07/07/2005 Document Revised: 04/27/2013 Document Reviewed: 03/14/2013 [facility] Patient Information 2014 [place], [place].   Assault, General Assault includes any behavior, whether intentional or reckless, which results in bodily injury to another person and/or damage to property. Included in this would be any behavior, intentional or reckless, that by its nature would be understood (interpreted) by a reasonable person as intent to harm another person or to damage his/her property. Threats may be oral or written. They may be communicated through regular mail, computer, fax, or phone. These threats may be direct or implied. FORMS OF ASSAULT INCLUDE:  Physically assaulting a person. This includes physical threats to inflict physical harm as well as:  Slapping.  Hitting.  Poking.  Kicking.  Punching.  Pushing.  Arson.  Sabotage.  Equipment vandalism.  Damaging or destroying property.  Throwing or hitting objects.  Displaying a weapon or an object that appears to be a weapon in a threatening manner.  Carrying a firearm of any kind.  Using a weapon to harm someone.  Using greater physical size/strength to intimidate another.  Making  intimidating or threatening gestures.  Bullying.  Hazing.  Intimidating, threatening, hostile, or abusive language directed toward another person.  It communicates the intention to engage in violence against that person. And it leads a reasonable person to expect that violent behavior may occur.  Stalking another person. IF IT HAPPENS AGAIN:  Immediately call for emergency help (911 in [place]  If someone poses clear and immediate danger to you, seek legal authorities to have a protective or restraining order put in place.  Less threatening assaults can at least be reported to authorities. STEPS TO TAKE IF A SEXUAL ASSAULT HAS HAPPENED  Go to an area of safety. This may include a shelter or staying with a friend. Stay away from the area where you have been attacked. A large percentage of sexual assaults are caused by a friend, relative or associate.  If medications were given by your caregiver, take them as directed for the full length of time prescribed.  Only take over-the-counter or prescription medicines for pain, discomfort, or fever as directed by your caregiver.  If you have come in contact with a sexual disease, find out if you are to be tested again. If your caregiver is concerned about the HIV/AIDS virus, he/she may require you to have continued testing for several months.  For the protection of your privacy, test results can not be given over the phone. Make sure you receive the results of your test. If your test results are not back during your visit, make an appointment with your caregiver to find out the results. Do not assume everything is normal if you have not heard from your caregiver or the medical facility. It is important for you to follow up on all of your test results.  File appropriate papers with authorities. This is important in all assaults, even if it has occurred in a family or by a friend. SEEK MEDICAL CARE IF:  You have new problems because of your  injuries.  You have problems that may be because of the medicine you are taking, such as:  Rash.  Itching.  Swelling.  Trouble breathing.  You develop belly (abdominal) pain, feel sick to your stomach (nausea) or are vomiting.  You begin to run a temperature.  You need supportive care or referral to a rape crisis center. These are centers with trained personnel who can help you get through this ordeal. SEEK IMMEDIATE MEDICAL CARE IF:  You are afraid of being threatened, beaten, or abused. In [place]., call 911.  You receive new injuries related to abuse.  You develop severe pain in any area injured in the assault or have any change in your condition that concerns you.  You faint or lose consciousness.  You develop chest pain or shortness of breath. Document Released: 07/07/2005 Document Revised: 09/29/2011 Document Reviewed: 02/23/2008 [facility] Patient Information 2014 [place], [place].  Laceration Care, Adult A laceration is a cut or lesion that goes through all layers of the skin and into the tissue just beneath the skin. TREATMENT  Some lacerations may not require closure. Some lacerations may not be able to be closed due to an increased risk of infection. It is important to see your caregiver as soon as possible after an injury to minimize the risk of infection and maximize the opportunity for successful closure. If closure is appropriate, pain medicines may be given, if needed. The wound will be cleaned to help prevent infection. Your caregiver will use stitches (sutures), staples, wound glue (adhesive), or skin adhesive strips to repair the laceration. These tools bring the skin edges together to allow for faster healing and a better cosmetic outcome. However, all wounds will heal with a scar. Once the wound has healed, scarring can be minimized by covering the wound with sunscreen during the day for 1 full year. HOME CARE INSTRUCTIONS  For sutures or staples:  Keep the wound  clean and dry.  If you were given a bandage (dressing), you should change it at least once a day. Also, change the dressing if it becomes wet or dirty, or as directed by your caregiver.  Wash the wound with soap and water 2 times a day. Rinse the wound off with water to remove all soap. Pat the wound dry with a clean towel.  After cleaning, apply a thin layer of the antibiotic ointment as recommended by your caregiver. This will help prevent infection and keep the dressing from sticking.  You may shower as usual after the first 24 hours. Do not soak the wound in water until the sutures are removed.  Only take over-the-counter or prescription medicines for pain, discomfort, or fever as directed by your caregiver.  Get your sutures or staples removed as directed by your caregiver. For skin adhesive strips:  Keep the wound clean and dry.  Do not get the skin adhesive strips wet. You may bathe carefully, using caution to keep the wound dry.  If the wound gets wet, pat it dry with a clean towel.  Skin adhesive strips will fall off on their own. You may trim the strips as the wound heals. Do not remove skin adhesive strips that are still stuck to the wound. They will fall off in time. For wound adhesive:  You may briefly wet your wound in the shower or bath. Do not soak or scrub the wound. Do not swim. Avoid periods of heavy perspiration until the skin adhesive has fallen off on its own. After showering or bathing, gently pat the wound dry with a clean towel.  Do not apply liquid medicine, cream medicine, or ointment medicine to your wound while the skin adhesive is in place. This may loosen the film before your wound is healed.  If a dressing is placed over the wound, be careful not to apply tape directly over the skin adhesive. This may cause the adhesive to be pulled off before the wound is healed.  Avoid prolonged exposure to sunlight or tanning lamps while the skin adhesive is in place.  Exposure to ultraviolet light in the first year will darken the scar.  The skin adhesive will usually remain in place for 5 to 10 days, then naturally fall off the skin. Do not pick at the adhesive film. You may need a tetanus shot if:  You cannot remember when you had your last tetanus shot.  You have never had a tetanus shot. If you get a tetanus shot, your arm may swell, get red, and feel warm to the touch. This is common and not a problem. If you need a tetanus shot and you choose not to have one, there is a rare chance of getting tetanus. Sickness from tetanus can be serious. SEEK MEDICAL CARE IF:   You have redness, swelling, or increasing pain in the wound.  You see a red line that goes away from the wound.  You have yellowish-white fluid (pus) coming from the wound.  You have a fever.  You notice a bad smell coming from the wound or dressing.  Your wound breaks open before or after sutures have been removed.  You notice something coming out of the wound such as wood or glass.  Your wound is on your hand or foot and you cannot move a finger or toe. SEEK IMMEDIATE MEDICAL CARE IF:   Your pain is not controlled with prescribed medicine.  You have severe swelling around the wound causing pain and numbness or a change in color in your arm, hand, leg, or foot.  Your wound splits open and starts bleeding.  You have worsening numbness, weakness, or loss of function of any joint around or beyond the wound.  You develop painful lumps near the wound or on the skin anywhere on your body. MAKE SURE YOU:   Understand these instructions.  Will watch your condition.  Will get help right away if you are not doing well or get worse. Document Released: 07/07/2005 Document Revised: 09/29/2011 Document Reviewed: 12/31/2010 [facility] Patient Information 2014 [place], [place].  RICE: Routine Care for Injuries The routine care of many injuries includes Rest, Ice, Compression, and  Elevation (RICE). HOME CARE INSTRUCTIONS  Rest is needed to allow your body to heal. Routine activities can usually be resumed when comfortable. Injured tendons and bones can take up to 6 weeks to heal. Tendons are the  cord-like structures that attach muscle to bone.  Ice following an injury helps keep the swelling down and reduces pain.  Put ice in a plastic bag.  Place a towel between your skin and the bag.  Leave the ice on for 15-20 minutes, 03-04 times a day. Do this while awake, for the first 24 to 48 hours. After that, continue as directed by your caregiver.  Compression helps keep swelling down. It also gives support and helps with discomfort. If an elastic bandage has been applied, it should be removed and reapplied every 3 to 4 hours. It should not be applied tightly, but firmly enough to keep swelling down. Watch fingers or toes for swelling, bluish discoloration, coldness, numbness, or excessive pain. If any of these problems occur, remove the bandage and reapply loosely. Contact your caregiver if these problems continue.  Elevation helps reduce swelling and decreases pain. With extremities, such as the arms, hands, legs, and feet, the injured area should be placed near or above the level of the heart, if possible. SEEK IMMEDIATE MEDICAL CARE IF:  You have persistent pain and swelling.  You develop redness, numbness, or unexpected weakness.  Your symptoms are getting worse rather than improving after several days. These symptoms may indicate that further evaluation or further X-rays are needed. Sometimes, X-rays may not show a small broken bone (fracture) until 1 week or 10 days later. Make a follow-up appointment with your caregiver. Ask when your X-ray results will be ready. Make sure you get your X-ray results. Document Released: 10/19/2000 Document Revised: 09/29/2011 Document Reviewed: 12/06/2010 [facility] Patient Information 2014 [place], [place].   Emergency Department  Resource Guide 1) Find a Doctor and Pay Out of Pocket Although you won't have to find out who is covered by your insurance plan, it is a good idea to ask around and get recommendations. You will then need to call the office and see if the doctor you have chosen will accept you as a new patient and what types of options they offer for patients who are self-pay. Some doctors offer discounts or will set up payment plans for their patients who do not have insurance, but you will need to ask so you aren't surprised when you get to your appointment.  2) Contact Your Local Health Department Not all health departments have doctors that can see patients for sick visits, but many do, so it is worth a call to see if yours does. If you don't know where your local health department is, you can check in your phone book. The CDC also has a tool to help you locate your state's health department, and many state websites also have listings of all of their local health departments.  3) Find a Walk-in Clinic If your illness is not likely to be very severe or complicated, you may want to try a walk in clinic. These are popping up all over the country in pharmacies, drugstores, and shopping centers. They're usually staffed by nurse practitioners or physician assistants that have been trained to treat common illnesses and complaints. They're usually fairly quick and inexpensive. However, if you have serious medical issues or chronic medical problems, these are probably not your best option.  No Primary Care Doctor: - Call Health Connect at  [phone] - they can help you locate a primary care doctor that  accepts your insurance, provides certain services, etc. - Physician Referral Service- [phone]  Chronic Pain Problems: Organization         Address  Phone   Notes  [facility]  [phone] Patients need to be referred by their primary care doctor.   Medication Assistance: Organization          Address  Phone   Notes  [facility] [address] [place] [place], [place] [address] [phone] --Must be a resident of [facility] -- Must have NO insurance coverage whatsoever (no Medicaid/ Medicare, etc.) -- The pt. MUST have a primary care doctor that directs their care regularly and follows them in the community   [facility]  [phone]   [employer]  [phone]    Agencies that provide inexpensive medical care: Organization         Address  Phone   Notes  [facility]  [phone]   [facility]    [phone]   [facility] [address] [place], [place] [address] [phone]   [facility] [address] [place] [phone]   Planned Parenthood    [phone]   [facility]    [phone]   [facility]  [address], [place] Phone:  [phone], Fax:  [phone] Hours of Operation:  9 am - 6 pm, M-F.  Also accepts Medicaid/Medicare and self-pay.  [facility]  [address], [place] Phone: [phone], Fax: [phone]. Hours of Operation:  8:30 am - 5:30 pm, M-F.  Also accepts Medicaid and self-pay.  [facility] [place] [address] [place] Phone: [phone]   [facility] [address] Natasha Bence [place], [place] [phone], Ext. [phone] Mondays & Thursdays: 7-9 AM.  First 15 patients are seen on a first come, first serve basis.    Medicaid-accepting [facility] Providers:  Organization         Address  Phone   Notes  [facility] [address], [place] [phone] Also accepts self-pay patients.  [facility] [address] Laurell Josephs [place], [place]  [phone]   [facility] [address], [place] [phone]   [facility] [address] [place] [phone]   Renaye Rakers [address], [place]   [phone] Only accepts [place] patients after they have their name applied to their card.   Self-Pay (no insurance) in [facility]:  Organization         Address  Phone   Notes  Sickle Cell Patients, [facility] [address] [place], [place] [phone]   [facility] [address] [place], [place] [phone]   [facility] [place]  [address], [place] [phone]   [facility]/Dr. Osei-Bonsu  [address] [place] [address], [place] or [address], [place] [phone] Phone number for both [place] and [place] locations is the same.  [facility] [address] [place] [phone]   [facility] [address] [place] or [address] [phone]   [facility] [address], [place] [phone], phone; [phone], fax Sees patients 1st and 3rd Saturday of every month.  Must not qualify for public  or private insurance (i.e. Medicaid, Medicare, [place] Health Choice, Veterans' Benefits)  Household income should be no more than 200% of the poverty level The clinic cannot treat you if you are pregnant or think you are pregnant  Sexually transmitted diseases are not treated at the clinic.    Dental Care: Organization         Address  Phone  Notes  [facility] Department of [facility] [address] [place], [place] [phone] Accepts children up to age 56 who are enrolled in [place] or [place] Health Choice; pregnant women with a Medicaid card; and children who have applied for Medicaid or [place] Health Choice, but were declined, whose parents can pay a reduced fee at time of service.  [facility] Department of [facility]  [address], [place] [phone] Accepts children up to age 87 who are enrolled in [place] or [place] Health Choice; pregnant women with a Medicaid card; and children who have applied for Medicaid or [place] Health Choice, but were declined, whose parents can pay a reduced fee at time of service.  [facility]  [address] [place], [place] [phone] Patients are seen by appointment only. Walk-ins are not accepted. [facility] will see patients 38 years of age and older. Monday - Tuesday (8am-5pm) Most Wednesdays (8:30-5pm) $30 per visit, cash only  [facility]  [address], [facility] [phone] Patients are seen by appointment only. Walk-ins are not accepted. [facility] will see patients 6 years of age and older. One Wednesday Evening (Monthly: Volunteer Based).  $30 per visit, cash only  [employer]  [phone] for adults; Children under age 59, call Graduate Pediatric Dentistry at [phone]. Children aged 38-14, please call [phone] to request a pediatric application.  Dental services are provided in all areas of dental care including fillings, crowns and bridges, complete and partial dentures, implants, gum treatment, root canals, and extractions. Preventive care is also provided. Treatment is provided to both adults and children. Patients are selected via a lottery and there is often a waiting list.   [facility] [address], [place]  [phone] [email or web address]   [facility] [address] [place], [place] [phone], Ext. [phone] Second and Fourth Thursday of each month, opens at 6:30 AM; Clinic ends at 9 AM.  Patients are seen on a first-come first-served basis, and a limited number are seen during each clinic.   [facility]  [address] Ether Griffins [place], [place] [phone]   Eligibility Requirements You must  have lived in [place], [place], or [place] counties for at least the last three months.   You cannot be eligible for state or federal sponsored [place], including [employer], [place], or [employer].   You generally cannot be eligible for healthcare insurance through your employer.    How to apply: Eligibility screenings are held every Tuesday and Wednesday afternoon from 1:00 pm until 4:00 pm. You do not need an appointment for the interview!  [facility] [address], [place], [place] [phone]   [facility] Health Department  [phone]   [facility] Health Department  [phone]   [facility] Health Department  [phone]    Behavioral Health Resources in the Community: Intensive Outpatient Programs Organization         Address  Phone  Notes  [facility] [address] [place], [place] [phone]   [facility] [address], [place], [place] [phone]   [facility] [address], [place], [place]  [phone]   [facility] Mental [facility] [address]  [place], [place] [phone] or [phone]   Substance Abuse Resources Organization         Address  Phone  Notes  [facility]  [phone]   [facility]  [phone]   [facility] [place]  [phone]   Floydene Flock  [phone]   Residential & Outpatient Substance Abuse Program  [phone]   Psychological Services Organization         Address  Phone  Notes  [facility] Behavioral Health  [phone]   [facility]  [phone]   [facility] Mental Health [address], [place] [phone] or [phone]    Mobile Crisis Teams Organization         Address  Phone  Notes  [facility], Mobile Crisis Care Unit  [phone]   [facility]  [address] [place], [place] [phone]   Doristine Locks [address] [place] [phone]    Self-Help/Support Groups Organization         Address  Phone             Notes  [facility] - variety of support groups  [phone]- I7437963 Call for more information  Narcotics Anonymous (NA), [facility] [address], [employer] [place]  2 meetings at this location   Statistician         Address  Phone  Notes  [facility] [address],    [place]  [phone]   [facility]  [address] [place] [address], [place], [place] [phone]   [facility] [address] [place], [place] [phone] Admissions: 8am-3pm M-F  [facility] [address]    [place], [place] [phone]   [facility] [address] [place], [place], [place] [phone]   [facility] [address],  [place], [place] [phone]   [facility] - Intensive Outpatient [address], [place], [place] [phone]   [facility] ([facility] [address]  [place], [place] [phone] or [phone]   [facility] [address], [place], [place] [phone] Accepts Medicaid  [facility] [address]  [place] [phone] Substance Abuse/Addiction Treatment   [facility] Organization         Address  Phone  Notes  [facility]  [phone]   Angie Fava, PhD [address] Knack [place], [place]   [phone] or [phone]   [facility] Behavioral   [address] [place], [place] [phone]   [facility] [address] [place], [place] [phone] Insurance/Medicaid/sponsorship through [facility] [address]                                    [place], [place] [phone] Therapy/tele-psych/case  [facility] [address], [place] [phone]    Dr. Lolly Mustache  [phone]   [facility]  [facility]  Health Dept. 1) [address] [place] 2) [place] 3)  [place] [address], [place] [phone]  [phone]   [place] [phone] or [phone] (After Hours)

## 2013-10-02 NOTE — ED Provider Notes (Signed)
Medical screening examination/treatment/procedure(s) were performed by non-physician practitioner and as supervising physician I was immediately available for consultation/collaboration.   EKG Interpretation None       Olivia Mackielga M Kaulder Zahner, MD 10/02/13 1755

## 2013-10-02 NOTE — ED Notes (Signed)
Pt at CT

## 2013-10-09 ENCOUNTER — Emergency Department (HOSPITAL_COMMUNITY)
Admission: EM | Admit: 2013-10-09 | Discharge: 2013-10-09 | Disposition: A | Discharge status: Home / Self Care | Payer: Self-pay | Attending: Emergency Medicine | Admitting: Emergency Medicine

## 2013-10-09 ENCOUNTER — Encounter (HOSPITAL_COMMUNITY): Payer: Self-pay | Admitting: Emergency Medicine

## 2013-10-09 DIAGNOSIS — J45909 Unspecified asthma, uncomplicated: Secondary | ICD-10-CM | POA: Insufficient documentation

## 2013-10-09 DIAGNOSIS — Z4802 Encounter for removal of sutures: Secondary | ICD-10-CM | POA: Insufficient documentation

## 2013-10-09 DIAGNOSIS — Z87891 Personal history of nicotine dependence: Secondary | ICD-10-CM | POA: Insufficient documentation
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
## 2013-10-09 IMAGING — CR DG KNEE 1-2V PORT*R*
2 series · 2 of 2 positions shown · non-contrast
Comparison: None.

CLINICAL DATA: Postop

PORTABLE RIGHT KNEE - 1-2 VIEW

[AP]
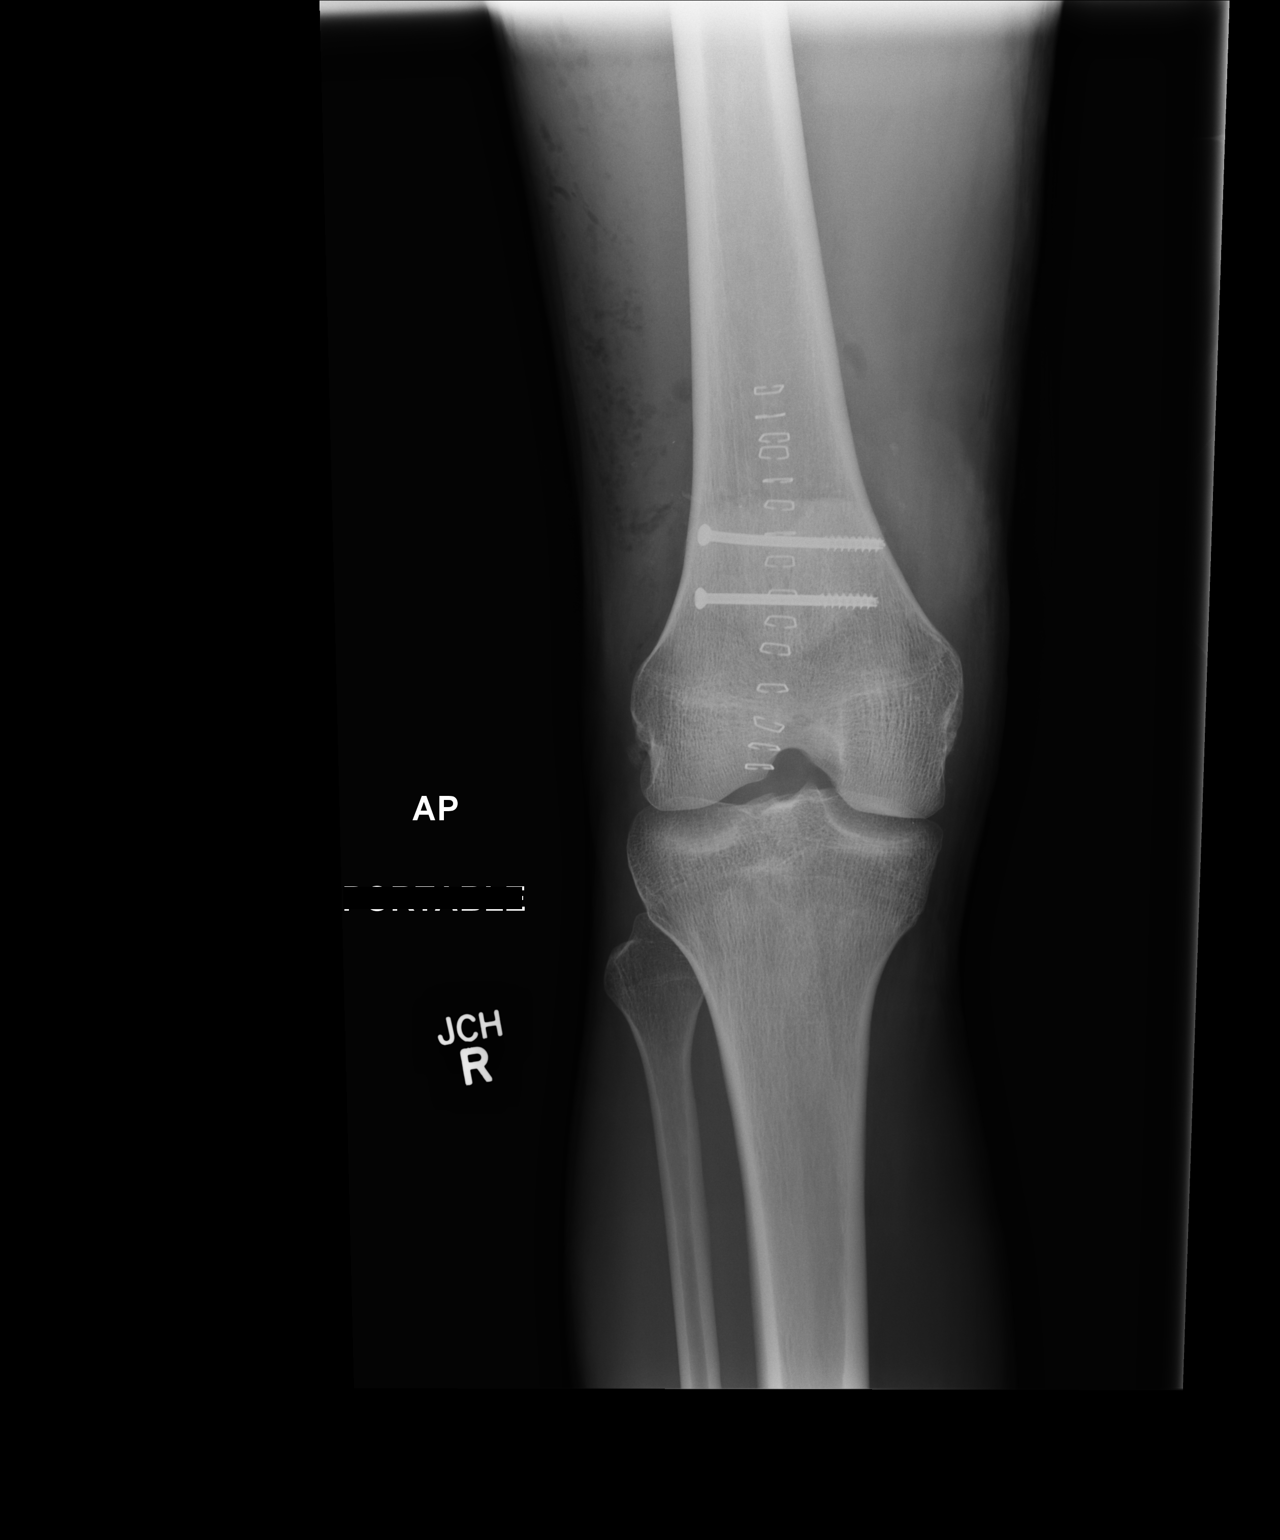

[xtable lateral]
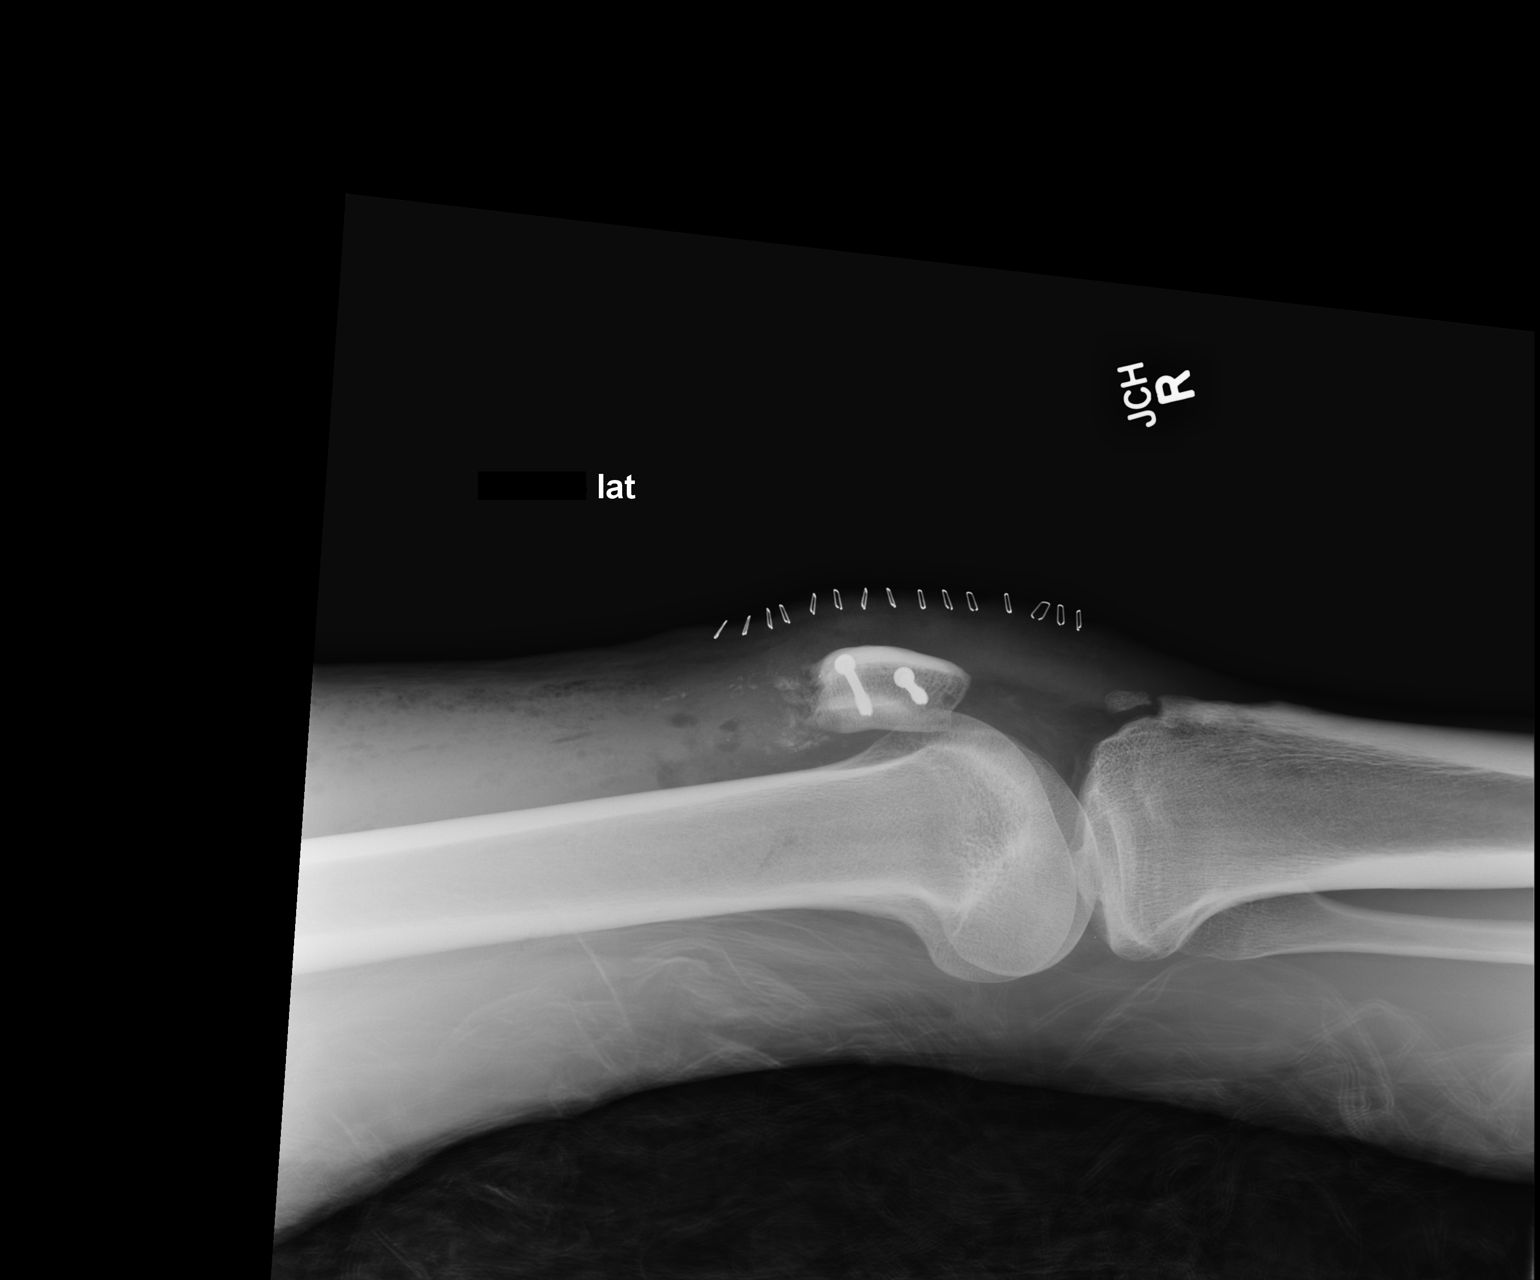

[2 of 2 positions shown; findings below may reference images not displayed]

FINDINGS: The patient is status post patellar fracture repair with
two metallic fixation screw across patella.  Midline skin staples
are noted.  Postsurgical changes with periarticular soft tissue
air.  There is a anatomic alignment.
IMPRESSION: Metallic fixation screws are noted in the right patella.  There is
anatomic alignment.  Midline skin staples are noted.  Postsurgical
changes are noted.
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
## 2013-10-09 NOTE — ED Provider Notes (Signed)
Medical screening examination/treatment/procedure(s) were performed by non-physician practitioner and as supervising physician I was immediately available for consultation/collaboration.   EKG Interpretation None        Junius ArgyleForrest S Odis Wickey, MD 10/09/13 1540

## 2013-10-09 NOTE — Discharge Instructions (Signed)
Suture Removal, Care After Refer to this sheet in the next few weeks. These instructions provide you with information on caring for yourself after your procedure. Your health care provider may also give you more specific instructions. Your treatment has been planned according to current medical practices, but problems sometimes occur. Call your health care provider if you have any problems or questions after your procedure. WHAT TO EXPECT AFTER THE PROCEDURE After your stitches (sutures) are removed, it is typical to have the following:  Some discomfort and swelling in the wound area.  Slight redness in the area. HOME CARE INSTRUCTIONS   If you have skin adhesive strips over the wound area, do not take the strips off. They will fall off on their own in a few days. If the strips remain in place after 14 days, you may remove them.  Change any bandages (dressings) at least once a day or as directed by your health care provider. If the bandage sticks, soak it off with warm, soapy water.  Apply cream or ointment only as directed by your health care provider. If using cream or ointment, wash the area with soap and water 2 times a day to remove all the cream or ointment. Rinse off the soap and pat the area dry with a clean towel.  Keep the wound area dry and clean. If the bandage becomes wet or dirty, or if it develops a bad smell, change it as soon as possible.  Continue to protect the wound from injury.  Use sunscreen when out in the sun. New scars become sunburned easily. SEEK MEDICAL CARE IF:  You have increasing redness, swelling, or pain in the wound.  You see pus coming from the wound.  You have a fever.  You notice a bad smell coming from the wound or dressing.  Your wound breaks open (edges not staying together). Document Released: 04/01/2001 Document Revised: 04/27/2013 Document Reviewed: 02/16/2013 ExitCare Patient Information 2014 [employer].  

## 2013-10-09 NOTE — ED Notes (Signed)
Pt reports here for suture removal. Pt has 4 sutures above right eye and 2 sutures below right eye placed 10/02/2013. Pt reports continues to have pain on right side of face.
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
## 2014-08-03 ENCOUNTER — Encounter (HOSPITAL_COMMUNITY): Payer: Self-pay | Admitting: Surgery

## 2016-07-21 ENCOUNTER — Ambulatory Visit (HOSPITAL_COMMUNITY)
Admission: EM | Admit: 2016-07-21 | Discharge: 2016-07-21 | Disposition: A | Discharge status: Home / Self Care | Payer: Self-pay | Attending: Family Medicine | Admitting: Family Medicine

## 2016-07-21 ENCOUNTER — Encounter (HOSPITAL_COMMUNITY): Payer: Self-pay | Admitting: Emergency Medicine

## 2016-07-21 DIAGNOSIS — K529 Noninfective gastroenteritis and colitis, unspecified: Secondary | ICD-10-CM

## 2016-07-21 DIAGNOSIS — R110 Nausea: Secondary | ICD-10-CM

## 2016-07-21 DIAGNOSIS — R197 Diarrhea, unspecified: Secondary | ICD-10-CM

## 2016-07-21 MED ORDER — ONDANSETRON 8 MG PO TBDP: 8.0000 mg | ORAL_TABLET | Freq: Three times a day (TID) | ORAL | 0 refills | Status: DC | PRN

## 2016-07-21 NOTE — ED Notes (Signed)
PT talking with visitor during triage.

## 2016-07-21 NOTE — ED Triage Notes (Signed)
PT reports vomiting for 2 days which resolved yesterday. PT still has diarrhea. PT had 4 episodes of diarrhea today. PT reports sore throat that started today.

## 2016-07-21 NOTE — ED Provider Notes (Signed)
CSN: [id number]     Arrival date & time 07/21/16  1636 History   First MD Initiated Contact with Patient 07/21/16 1805     Chief Complaint  Patient presents with  . Diarrhea   (Consider location/radiation/quality/duration/timing/severity/associated sxs/prior Treatment) Patient is here for c/o nausea and diarrhea for 2 days   The history is provided by the patient.  Diarrhea  Quality:  Watery Severity:  Moderate Onset quality:  Sudden Number of episodes:  4 Duration:  2 days Timing:  Constant Progression:  Worsening Relieved by:  Nothing Worsened by:  Nothing Ineffective treatments:  None tried Associated symptoms: fever     Past Medical History:  Diagnosis Date  . Asthma    Past Surgical History:  Procedure Laterality Date  . IRRIGATION AND DEBRIDEMENT KNEE  12/28/2011   Procedure: IRRIGATION AND DEBRIDEMENT KNEE;  Surgeon: Mable ParisJustin Olden Chandler, MD;  [facility];  Service: Orthopedics;  Laterality: Right;  . ORIF PATELLA  12/28/2011   Procedure: OPEN REDUCTION INTERNAL (ORIF) FIXATION PATELLA;  Surgeon: Mable ParisJustin Eian Chandler, MD;  [facility];  Service: Orthopedics;  Laterality: Right;   No family history on file. Social History  Substance Use Topics  . Smoking status: Former Smoker    Packs/day: 2.00    Quit date: 10/02/2013  . Smokeless tobacco: Not on file  . Alcohol use Yes     Comment: Stopped 10/02/2013    Review of Systems  Constitutional: Positive for fatigue and fever.  HENT: Negative.   Eyes: Negative.   Respiratory: Negative.   Cardiovascular: Negative.   Gastrointestinal: Positive for diarrhea.  Endocrine: Negative.   Genitourinary: Negative.   Musculoskeletal: Negative.   Skin: Negative.   Allergic/Immunologic: Negative.   Neurological: Negative.   Hematological: Negative.   Psychiatric/Behavioral: Negative.     Allergies  Tylenol [acetaminophen]  Home Medications   Prior to Admission medications   Medication Sig Start Date End  Date Taking? Authorizing Provider  ondansetron (ZOFRAN ODT) 8 MG disintegrating tablet Take 1 tablet (8 mg total) by mouth every 8 (eight) hours as needed for nausea or vomiting. 07/21/16   Deatra CanterWilliam J Darlin Stenseth, FNP   Meds Ordered and Administered this Visit  Medications - No data to display  BP 131/78   Pulse 66   Temp 97.7 F (36.5 C) (Oral)   Resp 16   Ht 6' (1.829 m)   SpO2 100%  No data found.   Physical Exam  Constitutional: He appears well-developed and well-nourished.  HENT:  Head: Normocephalic and atraumatic.  Right Ear: External ear normal.  Left Ear: External ear normal.  Mouth/Throat: Oropharynx is clear and moist.  Eyes: Conjunctivae and EOM are normal. Pupils are equal, round, and reactive to light.  Neck: Normal range of motion. Neck supple.  Cardiovascular: Normal rate, regular rhythm and normal heart sounds.   Pulmonary/Chest: Effort normal and breath sounds normal.  Abdominal: Soft. Bowel sounds are normal.  Nursing note and vitals reviewed.   Urgent Care Course   Clinical Course     Procedures (including critical care time)  Labs Review Labs Reviewed - No data to display  Imaging Review No results found.   Visual Acuity Review  Right Eye Distance:   Left Eye Distance:   Bilateral Distance:    Right Eye Near:   Left Eye Near:    Bilateral Near:         MDM   1. Gastroenteritis   2. Diarrhea, unspecified type   3. Nausea  Zofran 8mg  one po tid prn #20 Push po fluids, rest, tylenol and motrin otc prn as directed for fever, arthralgias, and myalgias.  Follow up prn if sx's continue or persist.    Deatra Canter, FNP 07/21/16 1816

## 2016-07-30 ENCOUNTER — Encounter (HOSPITAL_COMMUNITY): Payer: Self-pay | Admitting: Emergency Medicine

## 2016-07-30 ENCOUNTER — Emergency Department (HOSPITAL_COMMUNITY)
Admission: EM | Admit: 2016-07-30 | Discharge: 2016-07-30 | Disposition: A | Discharge status: Home / Self Care | Payer: Self-pay | Attending: Physician Assistant | Admitting: Physician Assistant

## 2016-07-30 DIAGNOSIS — Z87891 Personal history of nicotine dependence: Secondary | ICD-10-CM | POA: Insufficient documentation

## 2016-07-30 DIAGNOSIS — M546 Pain in thoracic spine: Secondary | ICD-10-CM | POA: Insufficient documentation

## 2016-07-30 DIAGNOSIS — J069 Acute upper respiratory infection, unspecified: Secondary | ICD-10-CM | POA: Insufficient documentation

## 2016-07-30 DIAGNOSIS — M549 Dorsalgia, unspecified: Secondary | ICD-10-CM

## 2016-07-30 DIAGNOSIS — J45909 Unspecified asthma, uncomplicated: Secondary | ICD-10-CM | POA: Insufficient documentation

## 2016-07-30 DIAGNOSIS — M545 Low back pain: Secondary | ICD-10-CM | POA: Insufficient documentation

## 2016-07-30 MED ORDER — CYCLOBENZAPRINE HCL 10 MG PO TABS: 10.0000 mg | ORAL_TABLET | Freq: Two times a day (BID) | ORAL | 0 refills | Status: DC | PRN

## 2016-07-30 NOTE — ED Triage Notes (Addendum)
Patient reports nasal congestion, productive cough, sore throat, and body aches x3 days. Patient also reports N/V/D x2 days ago that has resolved. Denies chest pain, shortness of breath, and abdominal pain. Ambulatory to triage.
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
## 2016-07-30 NOTE — Discharge Instructions (Signed)
Please read attached information. If you experience any new or worsening signs or symptoms please return to the emergency room for evaluation. Please follow-up with your primary care provider or specialist as discussed. Please use medication prescribed only as directed and discontinue taking if you have any concerning signs or symptoms.   °

## 2016-07-31 ENCOUNTER — Emergency Department (HOSPITAL_COMMUNITY)
Admission: EM | Admit: 2016-07-31 | Discharge: 2016-07-31 | Disposition: A | Discharge status: Home / Self Care | Payer: Self-pay | Attending: Emergency Medicine | Admitting: Emergency Medicine

## 2016-07-31 ENCOUNTER — Encounter (HOSPITAL_COMMUNITY): Payer: Self-pay | Admitting: Emergency Medicine

## 2016-07-31 DIAGNOSIS — M545 Low back pain, unspecified: Secondary | ICD-10-CM

## 2016-07-31 DIAGNOSIS — Z87891 Personal history of nicotine dependence: Secondary | ICD-10-CM | POA: Insufficient documentation

## 2016-07-31 DIAGNOSIS — J45909 Unspecified asthma, uncomplicated: Secondary | ICD-10-CM | POA: Insufficient documentation

## 2016-07-31 DIAGNOSIS — G8929 Other chronic pain: Secondary | ICD-10-CM | POA: Insufficient documentation

## 2016-07-31 DIAGNOSIS — Z79899 Other long term (current) drug therapy: Secondary | ICD-10-CM | POA: Insufficient documentation

## 2016-07-31 HISTORY — DX: Accidental discharge from unspecified firearms or gun, initial encounter: W34.00XA

## 2016-07-31 MED ORDER — METHOCARBAMOL 500 MG PO TABS: 500.0000 mg | ORAL_TABLET | Freq: Two times a day (BID) | ORAL | 0 refills | Status: DC

## 2016-07-31 MED ORDER — LIDOCAINE 5 % EX PTCH: 1.0000 | MEDICATED_PATCH | CUTANEOUS | 0 refills | Status: DC

## 2016-07-31 MED ORDER — NAPROXEN 500 MG PO TABS
500.0000 mg | ORAL_TABLET | Freq: Two times a day (BID) | ORAL | 0 refills | Status: DC
Start: 2016-07-31 — End: 2016-10-09
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
## 2016-07-31 NOTE — Discharge Instructions (Signed)
Take it easy, but do not lay around too much as this may make any stiffness worse. Take 500 mg of naproxen every 12 hours or 800 mg of ibuprofen every 8 hours for the next 3 days. Take these medications with food to avoid upset stomach. Robaxin is a muscle relaxer and may help loosen stiff muscles. Do not take the Robaxin while driving or performing other dangerous activities. Be sure to perform the attached exercises starting with three times a week and working up to performing them daily. This is an essential part of preventing long term problems. Follow up with a primary care provider for any future management of these complaints.  No medication will take your pain away. There is no short term or short cut solution to this issue. The place to start for chronic management of this issue to secure a primary care provider.
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
## 2016-10-09 ENCOUNTER — Emergency Department (HOSPITAL_COMMUNITY)
Admission: EM | Admit: 2016-10-09 | Discharge: 2016-10-09 | Disposition: A | Discharge status: Home / Self Care | Payer: Self-pay | Attending: Emergency Medicine | Admitting: Emergency Medicine

## 2016-10-09 ENCOUNTER — Encounter (HOSPITAL_COMMUNITY): Payer: Self-pay | Admitting: Emergency Medicine

## 2016-10-09 DIAGNOSIS — G8929 Other chronic pain: Secondary | ICD-10-CM | POA: Insufficient documentation

## 2016-10-09 DIAGNOSIS — M25561 Pain in right knee: Secondary | ICD-10-CM | POA: Insufficient documentation

## 2016-10-09 DIAGNOSIS — J029 Acute pharyngitis, unspecified: Secondary | ICD-10-CM | POA: Insufficient documentation

## 2016-10-09 DIAGNOSIS — J028 Acute pharyngitis due to other specified organisms: Secondary | ICD-10-CM

## 2016-10-09 DIAGNOSIS — Z87891 Personal history of nicotine dependence: Secondary | ICD-10-CM | POA: Insufficient documentation

## 2016-10-09 DIAGNOSIS — J45909 Unspecified asthma, uncomplicated: Secondary | ICD-10-CM | POA: Insufficient documentation

## 2016-10-09 MED ORDER — METHOCARBAMOL 500 MG PO TABS
500.0000 mg | ORAL_TABLET | Freq: Once | ORAL | Status: AC
  Administered 2016-10-09: 500 mg via ORAL
  Filled 2016-10-09: qty 1

## 2016-10-09 MED ORDER — METHOCARBAMOL 500 MG PO TABS: 500.0000 mg | ORAL_TABLET | Freq: Three times a day (TID) | ORAL | 0 refills | Status: DC | PRN

## 2016-10-09 MED ORDER — IBUPROFEN 600 MG PO TABS: 600.0000 mg | ORAL_TABLET | Freq: Three times a day (TID) | ORAL | 0 refills | Status: DC | PRN

## 2016-10-09 MED ORDER — IBUPROFEN 400 MG PO TABS
600.0000 mg | ORAL_TABLET | Freq: Once | ORAL | Status: AC
  Administered 2016-10-09: 600 mg via ORAL
  Filled 2016-10-09: qty 1
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
## 2016-10-09 NOTE — ED Triage Notes (Signed)
Pt reports chronic right knee pain that has been worse over the past few months, also c/o sore throat and cold symptoms x4 days. resp e/u, pt ambulatory to room.

## 2016-10-09 NOTE — ED Notes (Signed)
ED Provider at bedside. 

## 2016-10-26 ENCOUNTER — Emergency Department (HOSPITAL_COMMUNITY): Payer: Self-pay

## 2016-10-26 ENCOUNTER — Emergency Department (HOSPITAL_COMMUNITY)
Admission: EM | Admit: 2016-10-26 | Discharge: 2016-10-26 | Disposition: A | Discharge status: Home / Self Care | Payer: Self-pay | Attending: Emergency Medicine | Admitting: Emergency Medicine

## 2016-10-26 ENCOUNTER — Encounter (HOSPITAL_COMMUNITY): Payer: Self-pay

## 2016-10-26 DIAGNOSIS — Z87891 Personal history of nicotine dependence: Secondary | ICD-10-CM | POA: Insufficient documentation

## 2016-10-26 DIAGNOSIS — J45909 Unspecified asthma, uncomplicated: Secondary | ICD-10-CM | POA: Insufficient documentation

## 2016-10-26 DIAGNOSIS — M25562 Pain in left knee: Secondary | ICD-10-CM | POA: Insufficient documentation
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
## 2016-10-26 NOTE — ED Notes (Signed)
Pt returned from X-ray.  

## 2016-10-26 NOTE — ED Notes (Signed)
Pt in hurry to leave did not want discharge vitals done.

## 2016-10-26 NOTE — ED Triage Notes (Signed)
Pt complaining of soreness and swelling to L knee. Pt denies any new injury/trauma to L knee. Pt with no obvious swelling or redness to knee. Pt ambulatory at triage.

## 2016-10-26 NOTE — ED Notes (Signed)
Called for triage x 2, no response. 

## 2017-02-11 ENCOUNTER — Emergency Department (HOSPITAL_COMMUNITY): Admission: EM | Admit: 2017-02-11 | Discharge: 2017-02-11 | Discharge status: Against Medical Advice | Payer: Self-pay

## 2017-02-11 ENCOUNTER — Emergency Department (HOSPITAL_COMMUNITY)
Admission: EM | Admit: 2017-02-11 | Discharge: 2017-02-11 | Disposition: A | Discharge status: Home / Self Care | Payer: Self-pay

## 2017-02-11 NOTE — ED Notes (Signed)
Called for triage X 3, no answer 

## 2017-03-09 ENCOUNTER — Encounter (HOSPITAL_COMMUNITY): Payer: Self-pay | Admitting: Emergency Medicine

## 2017-03-09 DIAGNOSIS — M545 Low back pain: Secondary | ICD-10-CM | POA: Insufficient documentation

## 2017-03-09 DIAGNOSIS — Z5321 Procedure and treatment not carried out due to patient leaving prior to being seen by health care provider: Secondary | ICD-10-CM | POA: Insufficient documentation
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
## 2017-03-10 ENCOUNTER — Emergency Department (HOSPITAL_COMMUNITY)
Admission: EM | Admit: 2017-03-10 | Discharge: 2017-03-10 | Discharge status: Against Medical Advice | Payer: Self-pay | Attending: Emergency Medicine | Admitting: Emergency Medicine

## 2017-03-10 NOTE — ED Notes (Signed)
Pt called for room x3 with no answer. 

## 2017-03-10 NOTE — ED Notes (Signed)
Pt has been called in lobby without response

## 2017-03-23 DIAGNOSIS — M545 Low back pain: Secondary | ICD-10-CM | POA: Insufficient documentation

## 2017-03-23 DIAGNOSIS — Z5321 Procedure and treatment not carried out due to patient leaving prior to being seen by health care provider: Secondary | ICD-10-CM | POA: Insufficient documentation

## 2017-03-23 DIAGNOSIS — M25561 Pain in right knee: Secondary | ICD-10-CM | POA: Insufficient documentation

## 2017-03-24 ENCOUNTER — Emergency Department (HOSPITAL_COMMUNITY)
Admission: EM | Admit: 2017-03-24 | Discharge: 2017-03-24 | Disposition: A | Discharge status: Home / Self Care | Payer: Self-pay | Attending: Emergency Medicine | Admitting: Emergency Medicine

## 2017-03-24 ENCOUNTER — Encounter (HOSPITAL_COMMUNITY): Payer: Self-pay | Admitting: Emergency Medicine

## 2017-03-24 NOTE — ED Notes (Signed)
Called to take to room  No response from lobby  

## 2017-03-24 NOTE — ED Triage Notes (Signed)
Pt states he has chronic pain in his lower back and is his right knee  Pt states the pain in his back is sharp and his leg is stiff

## 2017-03-24 NOTE — ED Notes (Signed)
Called pt   No response from lobby  

## 2018-04-08 ENCOUNTER — Other Ambulatory Visit: Payer: Self-pay

## 2018-04-08 ENCOUNTER — Emergency Department (HOSPITAL_COMMUNITY): Payer: Self-pay

## 2018-04-08 ENCOUNTER — Encounter (HOSPITAL_COMMUNITY): Payer: Self-pay | Admitting: Emergency Medicine

## 2018-04-08 ENCOUNTER — Emergency Department (HOSPITAL_COMMUNITY)
Admission: EM | Admit: 2018-04-08 | Discharge: 2018-04-09 | Disposition: A | Discharge status: Home / Self Care | Payer: Self-pay | Attending: Emergency Medicine | Admitting: Emergency Medicine

## 2018-04-08 DIAGNOSIS — M545 Low back pain, unspecified: Secondary | ICD-10-CM

## 2018-04-08 DIAGNOSIS — F1290 Cannabis use, unspecified, uncomplicated: Secondary | ICD-10-CM | POA: Insufficient documentation

## 2018-04-08 DIAGNOSIS — F17210 Nicotine dependence, cigarettes, uncomplicated: Secondary | ICD-10-CM | POA: Insufficient documentation

## 2018-04-08 MED ORDER — KETOROLAC TROMETHAMINE 30 MG/ML IJ SOLN
30.0000 mg | Freq: Once | INTRAMUSCULAR | Status: AC
Start: 2018-04-08 — End: 2018-04-09
  Administered 2018-04-09: 30 mg via INTRAMUSCULAR
  Filled 2018-04-08: qty 1

## 2018-04-08 MED ORDER — METHOCARBAMOL 500 MG PO TABS
1000.0000 mg | ORAL_TABLET | Freq: Once | ORAL | Status: AC
Start: 2018-04-08 — End: 2018-04-09
  Administered 2018-04-09: 1000 mg via ORAL
  Filled 2018-04-08: qty 2

## 2018-04-08 MED ORDER — LIDOCAINE 5 % EX PTCH
1.0000 | MEDICATED_PATCH | CUTANEOUS | Status: DC
  Administered 2018-04-09: 1 via TRANSDERMAL
  Filled 2018-04-08: qty 1
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
## 2018-04-09 MED ORDER — METHOCARBAMOL 500 MG PO TABS: 500.0000 mg | ORAL_TABLET | Freq: Two times a day (BID) | ORAL | 0 refills | Status: DC

## 2018-04-09 MED ORDER — NAPROXEN 500 MG PO TABS: 500.0000 mg | ORAL_TABLET | Freq: Two times a day (BID) | ORAL | 0 refills | Status: DC
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
## 2018-04-09 NOTE — Discharge Instructions (Addendum)
X-ray looks good.  Please use Naprosyn twice daily and Robaxin as needed for back pain, do not take Robaxin before driving or operating machinery as this can cause drowsiness.  I would also like free to use over-the-counter salon pas cane patches, ice and heat.  Do not combine with any other over-the-counter pain relievers aside from Tylenol.   Looks like Dr. Jones BroomJustin Chandler did your knee surgery several years ago if you continue to have pain in your knee please follow-up with him.  Return to the emergency department if you have significantly worsening back pain, loss of bowel or bladder control, fevers, numbness or weakness in both legs or any other new or concerning symptoms.

## 2019-06-29 ENCOUNTER — Other Ambulatory Visit: Payer: Self-pay

## 2019-06-29 ENCOUNTER — Encounter (HOSPITAL_COMMUNITY): Payer: Self-pay

## 2019-06-29 ENCOUNTER — Ambulatory Visit (HOSPITAL_COMMUNITY)
Admission: EM | Admit: 2019-06-29 | Discharge: 2019-06-29 | Disposition: A | Discharge status: Home / Self Care | Payer: Self-pay | Attending: Emergency Medicine | Admitting: Emergency Medicine

## 2019-06-29 DIAGNOSIS — Z113 Encounter for screening for infections with a predominantly sexual mode of transmission: Secondary | ICD-10-CM | POA: Insufficient documentation

## 2019-06-29 DIAGNOSIS — R109 Unspecified abdominal pain: Secondary | ICD-10-CM

## 2019-06-29 DIAGNOSIS — R3915 Urgency of urination: Secondary | ICD-10-CM

## 2019-06-29 DIAGNOSIS — Z202 Contact with and (suspected) exposure to infections with a predominantly sexual mode of transmission: Secondary | ICD-10-CM | POA: Insufficient documentation

## 2019-06-29 LAB — HIV ANTIBODY (ROUTINE TESTING W REFLEX): HIV Screen 4th Generation wRfx: NONREACTIVE

## 2019-06-29 MED ORDER — CEFTRIAXONE SODIUM 250 MG IJ SOLR
250.0000 mg | Freq: Once | INTRAMUSCULAR | Status: AC
  Administered 2019-06-29: 15:00:00 250 mg via INTRAMUSCULAR

## 2019-06-29 MED ORDER — AZITHROMYCIN 250 MG PO TABS
ORAL_TABLET | ORAL | Status: AC
  Filled 2019-06-29: qty 4

## 2019-06-29 MED ORDER — AZITHROMYCIN 250 MG PO TABS
1000.0000 mg | ORAL_TABLET | Freq: Once | ORAL | Status: AC
  Administered 2019-06-29: 15:00:00 1000 mg via ORAL

## 2019-06-29 MED ORDER — CEFTRIAXONE SODIUM 250 MG IJ SOLR
INTRAMUSCULAR | Status: AC
  Filled 2019-06-29: qty 250
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
## 2019-06-29 NOTE — Discharge Instructions (Addendum)
Advised patient to practice safe sex Advised patient to return to urgent care or to follow-up with primary care if symptoms get worse Advised patient to review safe sex instruction that is attached
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
## 2019-06-30 LAB — SYPHILIS: RPR W/REFLEX TO RPR TITER AND TREPONEMAL ANTIBODIES, TRADITIONAL SCREENING AND DIAGNOSIS ALGORITHM: RPR Ser Ql: NONREACTIVE

## 2020-04-09 ENCOUNTER — Encounter (HOSPITAL_COMMUNITY): Payer: Self-pay | Admitting: Emergency Medicine

## 2020-04-09 ENCOUNTER — Other Ambulatory Visit: Payer: Self-pay

## 2020-04-09 ENCOUNTER — Ambulatory Visit (HOSPITAL_COMMUNITY)
Admission: EM | Admit: 2020-04-09 | Discharge: 2020-04-09 | Disposition: A | Discharge status: Home / Self Care | Payer: HRSA Program | Attending: Internal Medicine | Admitting: Internal Medicine

## 2020-04-09 DIAGNOSIS — Z791 Long term (current) use of non-steroidal anti-inflammatories (NSAID): Secondary | ICD-10-CM | POA: Insufficient documentation

## 2020-04-09 DIAGNOSIS — J208 Acute bronchitis due to other specified organisms: Secondary | ICD-10-CM

## 2020-04-09 DIAGNOSIS — J45909 Unspecified asthma, uncomplicated: Secondary | ICD-10-CM | POA: Diagnosis not present

## 2020-04-09 DIAGNOSIS — F17210 Nicotine dependence, cigarettes, uncomplicated: Secondary | ICD-10-CM | POA: Insufficient documentation

## 2020-04-09 DIAGNOSIS — U071 COVID-19: Secondary | ICD-10-CM | POA: Diagnosis not present

## 2020-04-09 DIAGNOSIS — R05 Cough: Secondary | ICD-10-CM | POA: Diagnosis present

## 2020-04-09 DIAGNOSIS — Z886 Allergy status to analgesic agent status: Secondary | ICD-10-CM | POA: Insufficient documentation

## 2020-04-09 MED ORDER — ALBUTEROL SULFATE HFA 108 (90 BASE) MCG/ACT IN AERS: 1.0000 | INHALATION_SPRAY | Freq: Four times a day (QID) | RESPIRATORY_TRACT | 0 refills | Status: DC | PRN

## 2020-04-09 MED ORDER — BENZONATATE 100 MG PO CAPS: 100.0000 mg | ORAL_CAPSULE | Freq: Three times a day (TID) | ORAL | 0 refills | Status: DC
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
## 2020-04-09 NOTE — ED Triage Notes (Signed)
Cough and shortness of breath for 1 week. Pt's girlfriend is covid positive.

## 2020-04-09 NOTE — Discharge Instructions (Signed)
Please quarantine with your family. If your symptoms worsen, please return to urgent care to be evaluated.

## 2020-04-10 ENCOUNTER — Telehealth (HOSPITAL_COMMUNITY): Payer: Self-pay

## 2020-04-10 LAB — SARS CORONAVIRUS 2 (TAT 6-24 HRS): SARS Coronavirus 2: POSITIVE — AB

## 2021-11-05 ENCOUNTER — Other Ambulatory Visit (HOSPITAL_COMMUNITY)
Admission: EM | Admit: 2021-11-05 | Discharge: 2021-11-07 | Disposition: A | Discharge status: Home / Self Care | Payer: No Payment, Other | Attending: Psychiatry | Admitting: Psychiatry

## 2021-11-05 DIAGNOSIS — F4310 Post-traumatic stress disorder, unspecified: Secondary | ICD-10-CM | POA: Insufficient documentation

## 2021-11-05 DIAGNOSIS — F1910 Other psychoactive substance abuse, uncomplicated: Secondary | ICD-10-CM | POA: Insufficient documentation

## 2021-11-05 DIAGNOSIS — F1010 Alcohol abuse, uncomplicated: Secondary | ICD-10-CM | POA: Insufficient documentation

## 2021-11-05 DIAGNOSIS — F329 Major depressive disorder, single episode, unspecified: Secondary | ICD-10-CM | POA: Insufficient documentation

## 2021-11-05 DIAGNOSIS — Z20822 Contact with and (suspected) exposure to covid-19: Secondary | ICD-10-CM | POA: Insufficient documentation

## 2021-11-05 DIAGNOSIS — F411 Generalized anxiety disorder: Secondary | ICD-10-CM | POA: Insufficient documentation

## 2021-11-05 DIAGNOSIS — F321 Major depressive disorder, single episode, moderate: Secondary | ICD-10-CM

## 2021-11-05 LAB — CBC WITH DIFFERENTIAL/PLATELET
Abs Immature Granulocytes: 0.02 10*3/uL (ref 0.00–0.07)
Basophils Absolute: 0 10*3/uL (ref 0.0–0.1)
Basophils Relative: 0 %
Eosinophils Absolute: 0.2 10*3/uL (ref 0.0–0.5)
Eosinophils Relative: 2 %
HCT: 47.7 % (ref 39.0–52.0)
Hemoglobin: 16.2 g/dL (ref 13.0–17.0)
Immature Granulocytes: 0 %
Lymphocytes Relative: 26 %
Lymphs Abs: 2.4 10*3/uL (ref 0.7–4.0)
MCH: 35 pg — ABNORMAL HIGH (ref 26.0–34.0)
MCHC: 34 g/dL (ref 30.0–36.0)
MCV: 103 fL — ABNORMAL HIGH (ref 80.0–100.0)
Monocytes Absolute: 0.9 10*3/uL (ref 0.1–1.0)
Monocytes Relative: 10 %
Neutro Abs: 5.6 10*3/uL (ref 1.7–7.7)
Neutrophils Relative %: 62 %
Platelets: 250 10*3/uL (ref 150–400)
RBC: 4.63 MIL/uL (ref 4.22–5.81)
RDW: 12.5 % (ref 11.5–15.5)
WBC: 9.2 10*3/uL (ref 4.0–10.5)
nRBC: 0 % (ref 0.0–0.2)

## 2021-11-05 LAB — POCT URINE DRUG SCREEN - MANUAL ENTRY (I-SCREEN)
POC Amphetamine UR: NOT DETECTED
POC Buprenorphine (BUP): NOT DETECTED
POC Cocaine UR: POSITIVE — AB
POC Marijuana UR: POSITIVE — AB
POC Methadone UR: NOT DETECTED
POC Methamphetamine UR: NOT DETECTED
POC Morphine: NOT DETECTED
POC Oxazepam (BZO): NOT DETECTED
POC Oxycodone UR: NOT DETECTED
POC Secobarbital (BAR): NOT DETECTED

## 2021-11-05 LAB — COMPREHENSIVE METABOLIC PANEL
ALT: 21 U/L (ref 0–44)
AST: 28 U/L (ref 15–41)
Albumin: 4.9 g/dL (ref 3.5–5.0)
Alkaline Phosphatase: 84 U/L (ref 38–126)
Anion gap: 14 (ref 5–15)
BUN: 8 mg/dL (ref 6–20)
CO2: 29 mmol/L (ref 22–32)
Calcium: 10.4 mg/dL — ABNORMAL HIGH (ref 8.9–10.3)
Chloride: 99 mmol/L (ref 98–111)
Creatinine, Ser: 1.04 mg/dL (ref 0.61–1.24)
GFR, Estimated: >60 mL/min (ref >60)
Glucose, Bld: 72 mg/dL (ref 70–99)
Potassium: 4.2 mmol/L (ref 3.5–5.1)
Sodium: 142 mmol/L (ref 135–145)
Total Bilirubin: 1 mg/dL (ref 0.3–1.2)
Total Protein: 8.8 g/dL — ABNORMAL HIGH (ref 6.5–8.1)

## 2021-11-05 LAB — POC SARS CORONAVIRUS 2 AG: SARSCOV2ONAVIRUS 2 AG: NEGATIVE

## 2021-11-05 LAB — HEMOGLOBIN A1C
Hgb A1c MFr Bld: 4.7 % — ABNORMAL LOW (ref 4.8–5.6)
Mean Plasma Glucose: 88.19 mg/dL

## 2021-11-05 LAB — TSH: TSH: 2.399 u[IU]/mL (ref 0.350–4.500)

## 2021-11-05 LAB — RESP PANEL BY RT-PCR (FLU A&B, COVID) ARPGX2
Influenza A by PCR: NEGATIVE
Influenza B by PCR: NEGATIVE
SARS Coronavirus 2 by RT PCR: NEGATIVE

## 2021-11-05 LAB — MAGNESIUM: Magnesium: 2.3 mg/dL (ref 1.7–2.4)

## 2021-11-05 LAB — ETHANOL: Alcohol, Ethyl (B): <10 mg/dL (ref <10)

## 2021-11-05 MED ORDER — LORAZEPAM 1 MG PO TABS: 1.0000 mg | ORAL_TABLET | Freq: Every day | ORAL | Status: DC

## 2021-11-05 MED ORDER — THIAMINE HCL 100 MG/ML IJ SOLN
100.0000 mg | Freq: Once | INTRAMUSCULAR | Status: AC
  Administered 2021-11-05: 100 mg via INTRAMUSCULAR
  Filled 2021-11-05: qty 2

## 2021-11-05 MED ORDER — ADULT MULTIVITAMIN W/MINERALS CH
1.0000 | ORAL_TABLET | Freq: Every day | ORAL | Status: DC
  Administered 2021-11-05 – 2021-11-07 (×3): 1 via ORAL
  Filled 2021-11-05 (×3): qty 1

## 2021-11-05 MED ORDER — THIAMINE HCL 100 MG PO TABS
100.0000 mg | ORAL_TABLET | Freq: Every day | ORAL | Status: DC
  Administered 2021-11-06 – 2021-11-07 (×2): 100 mg via ORAL
  Filled 2021-11-05 (×2): qty 1

## 2021-11-05 MED ORDER — MAGNESIUM HYDROXIDE 400 MG/5ML PO SUSP: 30.0000 mL | Freq: Every day | ORAL | Status: DC | PRN

## 2021-11-05 MED ORDER — ACETAMINOPHEN 325 MG PO TABS: 650.0000 mg | ORAL_TABLET | Freq: Four times a day (QID) | ORAL | Status: DC | PRN

## 2021-11-05 MED ORDER — LOPERAMIDE HCL 2 MG PO CAPS: 2.0000 mg | ORAL_CAPSULE | ORAL | Status: DC | PRN

## 2021-11-05 MED ORDER — ONDANSETRON 4 MG PO TBDP: 4.0000 mg | ORAL_TABLET | Freq: Four times a day (QID) | ORAL | Status: DC | PRN

## 2021-11-05 MED ORDER — LORAZEPAM 1 MG PO TABS
1.0000 mg | ORAL_TABLET | Freq: Three times a day (TID) | ORAL | Status: DC
  Administered 2021-11-07: 1 mg via ORAL
  Filled 2021-11-05 (×3): qty 1

## 2021-11-05 MED ORDER — ALUM & MAG HYDROXIDE-SIMETH 200-200-20 MG/5ML PO SUSP: 30.0000 mL | ORAL | Status: DC | PRN

## 2021-11-05 MED ORDER — LORAZEPAM 1 MG PO TABS
1.0000 mg | ORAL_TABLET | Freq: Four times a day (QID) | ORAL | Status: AC
  Administered 2021-11-05 – 2021-11-06 (×4): 1 mg via ORAL
  Filled 2021-11-05 (×4): qty 1

## 2021-11-05 MED ORDER — HYDROXYZINE HCL 25 MG PO TABS: 25.0000 mg | ORAL_TABLET | Freq: Four times a day (QID) | ORAL | Status: DC | PRN

## 2021-11-05 MED ORDER — LORAZEPAM 1 MG PO TABS: 1.0000 mg | ORAL_TABLET | Freq: Two times a day (BID) | ORAL | Status: DC

## 2021-11-05 MED ORDER — LORAZEPAM 1 MG PO TABS
1.0000 mg | ORAL_TABLET | Freq: Four times a day (QID) | ORAL | Status: DC | PRN
  Administered 2021-11-06: 1 mg via ORAL

## 2021-11-05 NOTE — Progress Notes (Signed)
?   11/05/21 1913  ?[place] Triage Screening (Walk-ins at [facility] only)  ?How Did You Hear About [place]? Self  ?What Is the Reason for Your Visit/Call Today? Mr. Tavis is a 36 yo male reporting as a walk in to [facility] for evaluation and treatment of addiction issues. Pt denies any current SI, HI, and only has AVH when he is under the influence of drugs. 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  Pt is wanting to come clean.  ?How Long Has This Been Causing You Problems? 1 wk - 1 month  ?Have You Recently Had Any Thoughts About Hurting Yourself? No  ?Are You Planning to Commit Suicide/Harm Yourself At This time? No  ?Have you Recently Had Thoughts About Anon Raices? No  ?Are You Planning To Harm Someone At This Time? No  ?Are you currently experiencing any auditory, visual or other hallucinations? No  ?Have You Used Any Alcohol or Drugs in the Past 24 Hours? Yes  ?How long ago did you use Drugs or Alcohol? few hours  ?What Did You Use and How Much? cocaine, mushrooms, MDMA, THC, etoh  ?Do you have any current medical co-morbidities that require immediate attention? No  ?Clinician description of patient physical appearance/behavior: pt seems to have very sad, flat affect  ?What Do You Feel Would Help You the Most Today? Alcohol or Drug Use Treatment  ?If access to [facility] was not available, would you have sought care in the Emergency Department? Yes  ?Determination of Need Urgent (48 hours)  ?Options For Referral Medication Management;Facility-Based Crisis;BH Urgent Care  ? ? ?
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
## 2021-11-05 NOTE — ED Notes (Signed)
Pt in room 136 until PCR results  ? ?

## 2021-11-05 NOTE — ED Notes (Signed)
Stat lab called for pickup 

## 2021-11-05 NOTE — ED Notes (Signed)
Pt sleeping@this time. Breathing even and unlabored. Will continue to monitor for safety 
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
## 2021-11-05 NOTE — ED Notes (Signed)
Pt admitted to [facility] due to substance use. A&O x4, calm and cooperative. Pt denies SI/HI/AVH. Pt tolerated lab work, skin assessment, and admission process well. Pt ambulated independently to unit. Oriented to unit/staff. Sandwich, chips, and juice given per pt request. No signs of acute distress noted. Will continue to monitor for safety.  ?

## 2021-11-06 ENCOUNTER — Encounter (HOSPITAL_COMMUNITY): Payer: Self-pay

## 2021-11-06 ENCOUNTER — Encounter (HOSPITAL_COMMUNITY): Payer: Self-pay | Admitting: Psychiatry

## 2021-11-06 DIAGNOSIS — F1910 Other psychoactive substance abuse, uncomplicated: Secondary | ICD-10-CM | POA: Diagnosis present

## 2021-11-06 DIAGNOSIS — F1010 Alcohol abuse, uncomplicated: Secondary | ICD-10-CM | POA: Diagnosis not present

## 2021-11-06 DIAGNOSIS — F411 Generalized anxiety disorder: Secondary | ICD-10-CM | POA: Diagnosis not present

## 2021-11-06 DIAGNOSIS — F329 Major depressive disorder, single episode, unspecified: Secondary | ICD-10-CM | POA: Diagnosis not present

## 2021-11-06 MED ORDER — PRAZOSIN HCL 1 MG PO CAPS
1.0000 mg | ORAL_CAPSULE | Freq: Every day | ORAL | Status: DC
  Administered 2021-11-06: 1 mg via ORAL
  Filled 2021-11-06: qty 1

## 2021-11-06 MED ORDER — ALBUTEROL SULFATE HFA 108 (90 BASE) MCG/ACT IN AERS
1.0000 | INHALATION_SPRAY | Freq: Four times a day (QID) | RESPIRATORY_TRACT | Status: DC | PRN
  Administered 2021-11-07: 2 via RESPIRATORY_TRACT
  Filled 2021-11-06: qty 6.7

## 2021-11-06 MED ORDER — FLUTICASONE PROPIONATE HFA 44 MCG/ACT IN AERO: 2.0000 | INHALATION_SPRAY | Freq: Two times a day (BID) | RESPIRATORY_TRACT | Status: DC | PRN

## 2021-11-06 MED ORDER — SERTRALINE HCL 25 MG PO TABS
25.0000 mg | ORAL_TABLET | Freq: Every day | ORAL | Status: DC
  Administered 2021-11-06 – 2021-11-07 (×2): 25 mg via ORAL
  Filled 2021-11-06 (×2): qty 1

## 2021-11-06 NOTE — ED Notes (Signed)
Pt is currently sleeping, no distress noted, environmental check complete, will continue to monitor patient for safety. ? ?

## 2021-11-06 NOTE — ED Notes (Signed)
Pt sleeping@this time. Breathing even and unlabored will continue to monitor for safety 

## 2021-11-06 NOTE — ED Notes (Signed)
Pt provided breakfast this morning.  Denies pain, SI, HI, and AVH.  Reports he slept well overnight.  Took all medications without issue.  Breathing is even and unlabored.  Will continue to monitor for safety. ?

## 2021-11-06 NOTE — BH IP Treatment Plan (Signed)
Interdisciplinary Treatment and Diagnostic Plan Update ? ?11/06/2021 ?Time of Session: 11:00AM ? ?Hubert AzureWilliam A Machorro ?MRN: [id number] ? ?Diagnosis:  ?Final diagnoses:  ?Alcohol abuse  ?Polysubstance abuse (HCC)  ?Anxiety state  ?Current moderate episode of major depressive disorder, unspecified whether recurrent (HCC)  ?PTSD (post-traumatic stress disorder)  ? ? ? ?Current Medications:  ?Current Facility-Administered Medications  ?Medication Dose Route Frequency Provider Last Rate Last Admin  ? acetaminophen (TYLENOL) tablet 650 mg  650 mg Oral Q6H PRN Sindy GuadeloupeWilliams, Roy, NP      ? albuterol (VENTOLIN HFA) 108 (90 Base) MCG/ACT inhaler 1-2 puff  1-2 puff Inhalation Q6H PRN Estella HuskLaubach, Katherine S, MD      ? alum & mag hydroxide-simeth (MAALOX/MYLANTA) 200-200-20 MG/5ML suspension 30 mL  30 mL Oral Q4H PRN Sindy GuadeloupeWilliams, Roy, NP      ? fluticasone (FLOVENT HFA) 44 MCG/ACT inhaler 2 puff  2 puff Inhalation BID PRN Estella HuskLaubach, Katherine S, MD      ? hydrOXYzine (ATARAX) tablet 25 mg  25 mg Oral Q6H PRN Sindy GuadeloupeWilliams, Roy, NP      ? loperamide (IMODIUM) capsule 2-4 mg  2-4 mg Oral PRN Sindy GuadeloupeWilliams, Roy, NP      ? LORazepam (ATIVAN) tablet 1 mg  1 mg Oral Q6H PRN Sindy GuadeloupeWilliams, Roy, NP      ? LORazepam (ATIVAN) tablet 1 mg  1 mg Oral QID Sindy GuadeloupeWilliams, Roy, NP   1 mg at 11/06/21 1313  ? Followed by  ? LORazepam (ATIVAN) tablet 1 mg  1 mg Oral TID Sindy GuadeloupeWilliams, Roy, NP      ? Followed by  ? Melene Muller[START ON 11/07/2021] LORazepam (ATIVAN) tablet 1 mg  1 mg Oral BID Sindy GuadeloupeWilliams, Roy, NP      ? Followed by  ? Melene Muller[START ON 11/09/2021] LORazepam (ATIVAN) tablet 1 mg  1 mg Oral Daily Sindy GuadeloupeWilliams, Roy, NP      ? magnesium hydroxide (MILK OF MAGNESIA) suspension 30 mL  30 mL Oral Daily PRN Sindy GuadeloupeWilliams, Roy, NP      ? multivitamin with minerals tablet 1 tablet  1 tablet Oral Daily Sindy GuadeloupeWilliams, Roy, NP   1 tablet at 11/06/21 0910  ? ondansetron (ZOFRAN-ODT) disintegrating tablet 4 mg  4 mg Oral Q6H PRN Sindy GuadeloupeWilliams, Roy, NP      ? prazosin (MINIPRESS) capsule 1 mg  1 mg Oral QHS Estella HuskLaubach, Katherine S,  MD      ? sertraline (ZOLOFT) tablet 25 mg  25 mg Oral Daily Estella HuskLaubach, Katherine S, MD   25 mg at 11/06/21 1143  ? thiamine tablet 100 mg  100 mg Oral Daily Sindy GuadeloupeWilliams, Roy, NP   100 mg at 11/06/21 0910  ? ?Current Outpatient Medications  ?Medication Sig Dispense Refill  ? Fluticasone Propionate HFA (FLOVENT HFA IN) Inhale 2 puffs into the lungs 2 (two) times daily as needed (For shortness of breath.).    ? albuterol (VENTOLIN HFA) 108 (90 Base) MCG/ACT inhaler Inhale 1-2 puffs into the lungs every 6 (six) hours as needed for wheezing or shortness of breath. (Patient taking differently: Inhale 2 puffs into the lungs every 6 (six) hours as needed for wheezing or shortness of breath.) 1 each 0  ? ?PTA Medications: ?Prior to Admission medications   ?Medication Sig Start Date End Date Taking? Authorizing Provider  ?Fluticasone Propionate HFA (FLOVENT HFA IN) Inhale 2 puffs into the lungs 2 (two) times daily as needed (For shortness of breath.).   Yes [provider]  ?albuterol (VENTOLIN HFA) 108 (90 Base) MCG/ACT inhaler Inhale 1-2  puffs into the lungs every 6 (six) hours as needed for wheezing or shortness of breath. ?Patient taking differently: Inhale 2 puffs into the lungs every 6 (six) hours as needed for wheezing or shortness of breath. 04/09/20   Lamptey, Britta Mccreedy, MD  ? ? ?Patient Stressors: Financial difficulties   ?Substance abuse   ? ?Patient Strengths: Ability for insight  ?Average or above average intelligence  ?Communication skills  ?General fund of knowledge  ?Motivation for treatment/growth  ? ?Treatment Modalities: Medication Management, Group therapy, Case management,  ?1 to 1 session with clinician, Psychoeducation, Recreational therapy. ? ? ?Physician Treatment Plan for Primary and Secondary Diagnosis:  ?Final diagnoses:  ?Alcohol abuse  ?Polysubstance abuse (HCC)  ?Anxiety state  ?Current moderate episode of major depressive disorder, unspecified whether recurrent (HCC)  ?PTSD (post-traumatic  stress disorder)  ? ?Long Term Goal(s): Improvement in symptoms so as ready for discharge ? ?Short Term Goals: Ability to identify changes in lifestyle to reduce recurrence of condition will improve ?Ability to verbalize feelings will improve ?Ability to disclose and discuss suicidal ideas ?Ability to demonstrate self-control will improve ?Ability to identify and develop effective coping behaviors will improve ?Ability to maintain clinical measurements within normal limits will improve ?Ability to identify triggers associated with substance abuse/mental health issues will improve ? ?Medication Management: Evaluate patient's response, side effects, and tolerance of medication regimen. ? ?Therapeutic Interventions: 1 to 1 sessions, Unit Group sessions and Medication administration. ? ?Evaluation of Outcomes: Progressing ? ?RN Treatment Plan for Primary Diagnosis:  ?Final diagnoses:  ?Alcohol abuse  ?Polysubstance abuse (HCC)  ?Anxiety state  ?Current moderate episode of major depressive disorder, unspecified whether recurrent (HCC)  ?PTSD (post-traumatic stress disorder)  ? ? ?Long Term Goal(s): Knowledge of disease and therapeutic regimen to maintain health will improve ? ?Short Term Goals: Ability to participate in decision making will improve, Ability to identify and develop effective coping behaviors will improve, and Compliance with prescribed medications will improve ? ?Medication Management: RN will administer medications as ordered by provider, will assess and evaluate patient's response and provide education to patient for prescribed medication. RN will report any adverse and/or side effects to prescribing provider. ? ?Therapeutic Interventions: 1 on 1 counseling sessions, Psychoeducation, Medication administration, Evaluate responses to treatment, Monitor vital signs and CBGs as ordered, Perform/monitor CIWA, COWS, AIMS and Fall Risk screenings as ordered, Perform wound care treatments as  ordered. ? ?Evaluation of Outcomes: Progressing ? ? ?LCSW Treatment Plan for Primary Diagnosis:  ?Final diagnoses:  ?Alcohol abuse  ?Polysubstance abuse (HCC)  ?Anxiety state  ?Current moderate episode of major depressive disorder, unspecified whether recurrent (HCC)  ?PTSD (post-traumatic stress disorder)  ? ? ?Long Term Goal(s): Safe transition to appropriate next level of care at discharge, Engage patient in therapeutic group addressing interpersonal concerns. ? ?Short Term Goals: Engage patient in aftercare planning with referrals and resources, Identify triggers associated with mental health/substance abuse issues, and Increase skills for wellness and recovery ? ?Therapeutic Interventions: Assess for all discharge needs, 1 to 1 time with Child psychotherapist, Explore available resources and support systems, Assess for adequacy in community support network, Educate family and significant other(s) on suicide prevention, Complete Psychosocial Assessment, Interpersonal group therapy. ? ?Evaluation of Outcomes: Progressing ? ? ?Progress in Treatment: ?Attending groups: Yes. ?Participating in groups: Yes. ?Taking medication as prescribed: Yes. ?Toleration medication: Yes. ?Family/Significant other contact made: No, will contact:  No one ?Patient understands diagnosis: No. ?Discussing patient identified problems/goals with staff: Yes. ?Medical problems stabilized or resolved:  Yes. ?Denies suicidal/homicidal ideation: Yes. ?Issues/concerns per patient self-inventory: No. ?Other: None  ? ?New problem(s) identified: None  ? ?New Short Term/Long Term Goal(s): Patient is motivated to remain sober following his discharge from the [facility] by participating in residential substance abuse services.  ? ?Patient Goals:  "I just need to get clean and go to treatment" ? ?Discharge Plan or Barriers: Johngabriel plans to discahrge to a residential treatment program to address his ongoing substance abuse issues. Referral has been made to [facility].  ? ?Reason for Continuation of Hospitalization: Anxiety ?Depression ?Medication stabilization ?Withdrawal symptoms ? ?Estimated Length of Stay: 3-5 days ? ?Last 3 [place] Suicide Severity Risk
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
## 2021-11-06 NOTE — ED Notes (Signed)
Pt in room asleep.  Breathing is unlabored and even. Will continue to monitor for safety.  ?
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
## 2021-11-06 NOTE — ED Notes (Signed)
Pt in his room laying in bed sleeping. Calm and cooperative. No c/o pain or distress. Will continue to monitor for safety ?
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
## 2021-11-07 DIAGNOSIS — F411 Generalized anxiety disorder: Secondary | ICD-10-CM | POA: Diagnosis not present

## 2021-11-07 DIAGNOSIS — F1010 Alcohol abuse, uncomplicated: Secondary | ICD-10-CM | POA: Diagnosis not present

## 2021-11-07 DIAGNOSIS — F329 Major depressive disorder, single episode, unspecified: Secondary | ICD-10-CM | POA: Diagnosis not present

## 2021-11-07 DIAGNOSIS — F1910 Other psychoactive substance abuse, uncomplicated: Secondary | ICD-10-CM | POA: Diagnosis not present

## 2021-11-07 MED ORDER — SERTRALINE HCL 50 MG PO TABS: 50.0000 mg | ORAL_TABLET | Freq: Every day | ORAL | Status: DC
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
## 2021-11-07 NOTE — Progress Notes (Signed)
Received Calvin [name] this AM asleep in his bed, he did not get up for breakfast, but  later got up for medications and lunch. He remained OOB in the milieu at intervals, social with his peers and talking on the phone. Some of the conversations were heated with hanging up without say goodbye. Later he requested to leave and was redirected to stay until the AM. He declined the advice and decided to sign out AMA. Dr. Leone Haven was made aware of the situaton. ?

## 2021-11-07 NOTE — Progress Notes (Signed)
Chrissie Noa signed the AMA form, this Clinical research associate witness his signature. He retrieved his personal belonging and was escorted to the lobby.   ?

## 2021-11-07 NOTE — ED Notes (Signed)
Pt sleeping@this time. Breathing even and unlabored will continue to monitor for safety 

## 2022-05-16 ENCOUNTER — Encounter (HOSPITAL_COMMUNITY): Payer: Self-pay

## 2022-05-16 ENCOUNTER — Emergency Department (HOSPITAL_COMMUNITY)
Admission: EM | Admit: 2022-05-16 | Discharge: 2022-05-17 | Discharge status: Against Medical Advice | Payer: Commercial Managed Care - HMO | Attending: Emergency Medicine | Admitting: Emergency Medicine

## 2022-05-16 DIAGNOSIS — Z5321 Procedure and treatment not carried out due to patient leaving prior to being seen by health care provider: Secondary | ICD-10-CM | POA: Insufficient documentation

## 2022-05-16 DIAGNOSIS — R0602 Shortness of breath: Secondary | ICD-10-CM | POA: Diagnosis present

## 2022-05-16 NOTE — ED Triage Notes (Signed)
Pt comes via [facility] for SOB and feeling like he is going to pass out,  from the store, possible drug use, but pt denies

## 2022-05-17 ENCOUNTER — Emergency Department (HOSPITAL_COMMUNITY): Payer: Commercial Managed Care - HMO

## 2022-05-17 ENCOUNTER — Other Ambulatory Visit (HOSPITAL_COMMUNITY): Payer: Self-pay

## 2022-05-17 LAB — COMPREHENSIVE METABOLIC PANEL
ALT: 30 U/L (ref 0–44)
AST: 47 U/L — ABNORMAL HIGH (ref 15–41)
Albumin: 4.8 g/dL (ref 3.5–5.0)
Alkaline Phosphatase: 65 U/L (ref 38–126)
Anion gap: 8 (ref 5–15)
BUN: 22 mg/dL — ABNORMAL HIGH (ref 6–20)
CO2: 25 mmol/L (ref 22–32)
Calcium: 9.2 mg/dL (ref 8.9–10.3)
Chloride: 102 mmol/L (ref 98–111)
Creatinine, Ser: 1.41 mg/dL — ABNORMAL HIGH (ref 0.61–1.24)
GFR, Estimated: >60 mL/min (ref >60)
Glucose, Bld: 152 mg/dL — ABNORMAL HIGH (ref 70–99)
Potassium: 3.7 mmol/L (ref 3.5–5.1)
Sodium: 135 mmol/L (ref 135–145)
Total Bilirubin: 0.7 mg/dL (ref 0.3–1.2)
Total Protein: 8.5 g/dL — ABNORMAL HIGH (ref 6.5–8.1)

## 2022-05-17 LAB — CBC
HCT: 44.2 % (ref 39.0–52.0)
Hemoglobin: 14.9 g/dL (ref 13.0–17.0)
MCH: 34.4 pg — ABNORMAL HIGH (ref 26.0–34.0)
MCHC: 33.7 g/dL (ref 30.0–36.0)
MCV: 102.1 fL — ABNORMAL HIGH (ref 80.0–100.0)
Platelets: 191 10*3/uL (ref 150–400)
RBC: 4.33 MIL/uL (ref 4.22–5.81)
RDW: 12.4 % (ref 11.5–15.5)
WBC: 7.2 10*3/uL (ref 4.0–10.5)
nRBC: 0 % (ref 0.0–0.2)

## 2022-05-17 LAB — RAPID URINE DRUG SCREEN, HOSP PERFORMED
Amphetamines: NOT DETECTED
Barbiturates: NOT DETECTED
Benzodiazepines: NOT DETECTED
Cocaine: POSITIVE — AB
Opiates: NOT DETECTED
Tetrahydrocannabinol: POSITIVE — AB

## 2022-05-17 LAB — ACETAMINOPHEN LEVEL: Acetaminophen (Tylenol), Serum: <10 ug/mL — ABNORMAL LOW (ref 10–30)

## 2022-05-17 LAB — ETHANOL: Alcohol, Ethyl (B): <10 mg/dL (ref <10)

## 2022-05-17 LAB — SALICYLATE LEVEL: Salicylate Lvl: <7 mg/dL — ABNORMAL LOW (ref 7.0–30.0)

## 2022-05-17 NOTE — ED Notes (Signed)
Pt stated that his lyft Theressa Millard is here and that he will be leaving

## 2022-07-05 ENCOUNTER — Ambulatory Visit (HOSPITAL_COMMUNITY)
Admission: EM | Admit: 2022-07-05 | Discharge: 2022-07-06 | Disposition: A | Discharge status: Home / Self Care | Payer: Commercial Managed Care - HMO | Attending: Urology | Admitting: Urology

## 2022-07-05 ENCOUNTER — Other Ambulatory Visit: Payer: Self-pay

## 2022-07-05 DIAGNOSIS — F17210 Nicotine dependence, cigarettes, uncomplicated: Secondary | ICD-10-CM | POA: Insufficient documentation

## 2022-07-05 DIAGNOSIS — F1910 Other psychoactive substance abuse, uncomplicated: Secondary | ICD-10-CM | POA: Diagnosis not present

## 2022-07-05 DIAGNOSIS — F1990 Other psychoactive substance use, unspecified, uncomplicated: Secondary | ICD-10-CM

## 2022-07-05 DIAGNOSIS — F419 Anxiety disorder, unspecified: Secondary | ICD-10-CM | POA: Diagnosis not present

## 2022-07-05 DIAGNOSIS — F4310 Post-traumatic stress disorder, unspecified: Secondary | ICD-10-CM | POA: Diagnosis not present

## 2022-07-05 DIAGNOSIS — Z1152 Encounter for screening for COVID-19: Secondary | ICD-10-CM | POA: Insufficient documentation

## 2022-07-05 DIAGNOSIS — F1010 Alcohol abuse, uncomplicated: Secondary | ICD-10-CM | POA: Diagnosis not present

## 2022-07-05 DIAGNOSIS — R440 Auditory hallucinations: Secondary | ICD-10-CM | POA: Insufficient documentation

## 2022-07-05 LAB — COMPREHENSIVE METABOLIC PANEL
ALT: 22 U/L (ref 0–44)
AST: 36 U/L (ref 15–41)
Albumin: 4.1 g/dL (ref 3.5–5.0)
Alkaline Phosphatase: 57 U/L (ref 38–126)
Anion gap: 11 (ref 5–15)
BUN: 9 mg/dL (ref 6–20)
CO2: 27 mmol/L (ref 22–32)
Calcium: 9.3 mg/dL (ref 8.9–10.3)
Chloride: 102 mmol/L (ref 98–111)
Creatinine, Ser: 1.13 mg/dL (ref 0.61–1.24)
GFR, Estimated: >60 mL/min (ref >60)
Glucose, Bld: 97 mg/dL (ref 70–99)
Potassium: 3.7 mmol/L (ref 3.5–5.1)
Sodium: 140 mmol/L (ref 135–145)
Total Bilirubin: 0.4 mg/dL (ref 0.3–1.2)
Total Protein: 7 g/dL (ref 6.5–8.1)

## 2022-07-05 LAB — POC SARS CORONAVIRUS 2 AG: SARSCOV2ONAVIRUS 2 AG: NEGATIVE

## 2022-07-05 LAB — CBC WITH DIFFERENTIAL/PLATELET
Abs Immature Granulocytes: 0.01 10*3/uL (ref 0.00–0.07)
Basophils Absolute: 0.1 10*3/uL (ref 0.0–0.1)
Basophils Relative: 1 %
Eosinophils Absolute: 0.1 10*3/uL (ref 0.0–0.5)
Eosinophils Relative: 1 %
HCT: 37.9 % — ABNORMAL LOW (ref 39.0–52.0)
Hemoglobin: 13.3 g/dL (ref 13.0–17.0)
Immature Granulocytes: 0 %
Lymphocytes Relative: 42 %
Lymphs Abs: 2.9 10*3/uL (ref 0.7–4.0)
MCH: 35.5 pg — ABNORMAL HIGH (ref 26.0–34.0)
MCHC: 35.1 g/dL (ref 30.0–36.0)
MCV: 101.1 fL — ABNORMAL HIGH (ref 80.0–100.0)
Monocytes Absolute: 0.7 10*3/uL (ref 0.1–1.0)
Monocytes Relative: 9 %
Neutro Abs: 3.2 10*3/uL (ref 1.7–7.7)
Neutrophils Relative %: 47 %
Platelets: 199 10*3/uL (ref 150–400)
RBC: 3.75 MIL/uL — ABNORMAL LOW (ref 4.22–5.81)
RDW: 12.4 % (ref 11.5–15.5)
WBC: 6.9 10*3/uL (ref 4.0–10.5)
nRBC: 0 % (ref 0.0–0.2)

## 2022-07-05 LAB — LIPID PANEL
Cholesterol: 111 mg/dL (ref 0–200)
HDL: 71 mg/dL (ref >40)
LDL Cholesterol: 32 mg/dL (ref 0–99)
Total CHOL/HDL Ratio: 1.6 RATIO
Triglycerides: 38 mg/dL (ref <150)
VLDL: 8 mg/dL (ref 0–40)

## 2022-07-05 LAB — TSH: TSH: 0.914 u[IU]/mL (ref 0.350–4.500)

## 2022-07-05 LAB — RESP PANEL BY RT-PCR (RSV, FLU A&B, COVID)  RVPGX2
Influenza A by PCR: NEGATIVE
Influenza B by PCR: NEGATIVE
Resp Syncytial Virus by PCR: NEGATIVE
SARS Coronavirus 2 by RT PCR: NEGATIVE

## 2022-07-05 LAB — ETHANOL: Alcohol, Ethyl (B): <10 mg/dL (ref <10)

## 2022-07-05 MED ORDER — TRAZODONE HCL 50 MG PO TABS: 50.0000 mg | ORAL_TABLET | Freq: Every evening | ORAL | Status: DC | PRN

## 2022-07-05 MED ORDER — ADULT MULTIVITAMIN W/MINERALS CH
1.0000 | ORAL_TABLET | Freq: Every day | ORAL | Status: DC
  Administered 2022-07-05 – 2022-07-06 (×2): 1 via ORAL
  Filled 2022-07-05 (×2): qty 1

## 2022-07-05 MED ORDER — LORAZEPAM 1 MG PO TABS: 1.0000 mg | ORAL_TABLET | Freq: Four times a day (QID) | ORAL | Status: DC | PRN

## 2022-07-05 MED ORDER — NAPROXEN 500 MG PO TABS
500.0000 mg | ORAL_TABLET | Freq: Two times a day (BID) | ORAL | Status: DC | PRN
  Administered 2022-07-06: 500 mg via ORAL
  Filled 2022-07-05: qty 1

## 2022-07-05 MED ORDER — LOPERAMIDE HCL 2 MG PO CAPS: 2.0000 mg | ORAL_CAPSULE | ORAL | Status: DC | PRN

## 2022-07-05 MED ORDER — DICYCLOMINE HCL 20 MG PO TABS: 20.0000 mg | ORAL_TABLET | Freq: Four times a day (QID) | ORAL | Status: DC | PRN

## 2022-07-05 MED ORDER — ALUM & MAG HYDROXIDE-SIMETH 200-200-20 MG/5ML PO SUSP: 30.0000 mL | ORAL | Status: DC | PRN

## 2022-07-05 MED ORDER — MAGNESIUM HYDROXIDE 400 MG/5ML PO SUSP: 30.0000 mL | Freq: Every day | ORAL | Status: DC | PRN

## 2022-07-05 MED ORDER — HYDROXYZINE HCL 25 MG PO TABS: 25.0000 mg | ORAL_TABLET | Freq: Three times a day (TID) | ORAL | Status: DC | PRN

## 2022-07-05 MED ORDER — THIAMINE HCL 100 MG/ML IJ SOLN
100.0000 mg | Freq: Once | INTRAMUSCULAR | Status: AC
  Administered 2022-07-05: 100 mg via INTRAMUSCULAR
  Filled 2022-07-05: qty 2

## 2022-07-05 MED ORDER — METHOCARBAMOL 500 MG PO TABS
500.0000 mg | ORAL_TABLET | Freq: Three times a day (TID) | ORAL | Status: DC | PRN
  Administered 2022-07-06: 500 mg via ORAL
  Filled 2022-07-05: qty 1

## 2022-07-05 MED ORDER — ONDANSETRON 4 MG PO TBDP: 4.0000 mg | ORAL_TABLET | Freq: Four times a day (QID) | ORAL | Status: DC | PRN

## 2022-07-05 MED ORDER — HYDROXYZINE HCL 25 MG PO TABS
25.0000 mg | ORAL_TABLET | Freq: Four times a day (QID) | ORAL | Status: DC | PRN
  Administered 2022-07-05 – 2022-07-06 (×2): 25 mg via ORAL
  Filled 2022-07-05 (×2): qty 1

## 2022-07-05 MED ORDER — THIAMINE MONONITRATE 100 MG PO TABS
100.0000 mg | ORAL_TABLET | Freq: Every day | ORAL | Status: DC
  Administered 2022-07-06: 100 mg via ORAL
  Filled 2022-07-05: qty 1

## 2022-07-05 NOTE — ED Notes (Signed)
Pt sleeping at present, no distress noted.  Monitoring for safety. 

## 2022-07-05 NOTE — ED Notes (Signed)
Pt denies any SI , HI or AV Hallucinations. States he is here for detox and would like to '' go to the other side, I've been there once before. ''  Pt reports drinking daily, more than fifth of liquor. He does not appear to be in any acute withdrawal and CIWA completed. Pt did report some anxiety, prn vistaril given.  Pt accepted am medications (given late as pt sleeping and did not rouse earlier.) he was also given meal and juice. Has been very polite, cooperative with staff and appears in no acute distress.  Pt is safe, will con't to monitor.
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
## 2022-07-05 NOTE — ED Notes (Signed)
Assumed care of patient at 0730 . Pt observed resting quietly, in no acute distress at this time. Respirations even and unlabored. Pt aware urine sample still needed per prior shift. Will con't to monitor.

## 2022-07-05 NOTE — Progress Notes (Signed)
   07/05/22 0457  Patient Reported Information  How Did You Hear About [place]? Self  What Is the Reason for Your Visit/Call Today? Pt reports, his addictions and PTSD. Pt  reports, using Cocaine, Oxycodone, Alcohol and Marijuana daily or every other day. Pt reports, some paranoia. Pt denies, SI, HI, self-injurious behaviors and access to weapons.  How Long Has This Been Causing You Problems? > than 6 months  What Do You Feel Would Help You the Most Today? Alcohol or Drug Use Treatment;Stress Management  Have You Recently Had Any Thoughts About Hurting Yourself? No  Are You Planning to Commit Suicide/Harm Yourself At This time? No  Have you Recently Had Thoughts About Hurting Someone Calvin [name]? No  Are You Planning To Harm Someone At This Time? No  Explanation: NA  Have You Used Any Alcohol or Drugs in the Past 24 Hours? Yes  What Did You Use and How Much? Pt reports, snorting Cocaine (various amounts) a couple of hours ago. Pt reports, taking 10 mg Oxycodone earlier today. Per pt, he's been on a drinking (liquor) binge since Thursday. Pt reports, smoking 8 grams of Marijuana daily.  Do You Currently Have a Therapist/Psychiatrist? No  Name of Therapist/Psychiatrist NA  Have You Been Recently Discharged From Any Office Practice or Programs? No  Explanation of Discharge From Practice/Program NA  CCA Screening Triage Referral Assessment  Type of Contact Face-to-Face  Location of Assessment [facility] Assessment Services  Provider location [facility] Assessment Services  Collateral Involvement Pt declined for clincian to contact anyone to obtain additional information.  Does Patient Have a Automotive engineer Guardian? No  Legal Guardian Contact Information NA  Copy of Legal Guardianship Form in Chart  (NA)  Legal Guardian Notified of Arrival   (NA)  Legal Guardian Notified of Pending Discharge   (NA)  If Minor and Not Living with Parent(s), Who has Custody? NA  Is CPS involved or ever been involved?  Never  Is APS involved or ever been involved? Never  Patient Determined To Be At Risk for Harm To Self or Others Based on Review of Patient Reported Information or Presenting Complaint? No  Method No Plan  Availability of Means No access or NA  Intent Vague intent or NA  Notification Required No need or identified person  Additional Information for Danger to Others Potential  (NA)  Additional Comments for Danger to Others Potential NA  Are There Guns or Other Weapons in Your Home? No  Types of Guns/Weapons NA  Are These Weapons Safely Secured?  (NA)  Who Could Verify You Are Able To Have These Secured: NA  Do You Have any Outstanding Charges, Pending Court Dates, Parole/Probation? Pt denies, current legal involvement. Pt reports in 2012 he was released from prison after seven years for Robbery with a Immunologist, Assault with a Systems developer.  Contacted To Inform of Risk of Harm To Self or Others:  (NA)  Does Patient Present under Involuntary Commitment? No  [place] of Residence [place]  Patient Currently Receiving the Following Services: Not Receiving Services  Determination of Need Urgent (48 hours)  Options For Referral Facility-Based Crisis;Medication Management;Inpatient Hospitalization;Outpatient Therapy;Partial Hospitalization    Determination of need: Urgent.    Redmond Pulling, MS, [facility], [facility] Triage Specialist [phone]

## 2022-07-05 NOTE — ED Notes (Signed)
Pt has been resting quietly , did not rouse when writer approached to take am meds. Respirations even and unlabored and pt appears in no acute distress. Pt able to make needs known, will con't ot monitor.

## 2022-07-05 NOTE — ED Notes (Signed)
Pt A&O x 4, no distress noted, calm & cooperative. Resting at present.  Denies SI, contracts for safety.  Monitoring for safety.

## 2022-07-06 NOTE — Progress Notes (Signed)
CSW provided resources listed below:   Substance Abuse Resources   [facility] - Admissions are currently completed Monday through Friday at 8am; both appointments and walk-ins are accepted.  Any individual that is a [facility] resident may present for a substance abuse screening and assessment for admission.  A person may be referred by numerous sources or self-refer.   Potential clients will be screened for medical necessity and appropriateness for the program.  Clients must meet criteria for high-intensity residential treatment services.  If clinically appropriate, a client will continue with the comprehensive clinical assessment and intake process, as well as enrollment in the [facility] Network.  Address: [address] [place], [place] [address] Admin Hours: Mon-Fri 8AM to [facility] Center Hours: 24/7 Phone: [phone] Fax: [phone]  [facility] (Det[facility]) Facility Based Crisis:  These are 3 locations for services for week and weekend: Please call before arrival:    [facility] Recovery Facility Based Crisis [facility])  Address: [address] [place], [place] [address] Phone: [phone]  [facility] Recovery Facility Based Crisis [facility]) Address: [address] [place], [place] [address] Phone#: [phone]  [facility] Recovery Facility Based Crisis [facility]) Address: [address] [place], [place] [address] Phone#: [phone]   [facility]): (offers outpatient therapy and intensive outpatient substance abuse therapy).  [address] [place], [place] [address] Phone: [phone]   Phone: [phone]  [facility] Intensive Outpatient Program [facility]) means structured individual and group addiction activities and services that are provided at an outpatient program designed to assist adult and adolescent consumers to begin recovery and learn skills for recovery maintenance. The [facility] SAIOP  program is offered at least 3 hours a day, 3 days a week. SAIOP services shall include a structured program consisting of, but not limited to, the following services: Individual counseling and support; Group counseling and support; Family counseling, training or support; Biochemical assays to identify recent drug use (e.g., urine drug screens); Strategies for relapse prevention to include community and social support systems in treatment; Life skills; Crisis contingency planning; Disease Management; and Treatment support activities that have been adapted or specifically designed for persons with physical disabilities, or persons with co-occurring disorders of mental illness and substance abuse/dependence or mental retardation/developmental disability and substance abuse/dependence.  Phone: [phone]   [facility])  Address: [address] [place], [place], [place] [address] Phone: [phone]   [facility] Address: [address] [place], [place] [address] Phone: [phone]  - a combination of group and individual sessions to meet the participants needs. This allows participants to engage in treatment and remain involved in their home and work life. - Transitional housing places program participants in a supportive living environment while they complete a treatment program and work to secure independent housing. - The Substance Abuse Intensive Outpatient Treatment Program at [employer] consists of structured group sessions and individual sessions that are designed to teach participants early recovery and relapse prevention skills. -[facility] works with the [employer] to provide a housing and treatment program for homeless veterans.   [facility]idential [facility], [employer].   Address: [address] [place], [place] [address] Phone#: [phone]   : Referrals to RTSA facilities can be made by [facility] and  [facility].  Referrals are also accepted from physicians, private providers, hospital emergency rooms, family members, or any person who has knowledge of someone  in the need of our services.  The [facility] will also offer the following outpatient services: (Monday through Friday 8am-5pm)   Partial Hospitalization Program (PHP) Substance Abuse Intensive Outpatient Program (SA-IOP) Group Therapy Medication Management Peer Living Room We also provide (24/7):  Assessments: Our mental health clinician and providers will conduct a focused mental health evaluation, assessing for immediate safety concerns and further mental health needs. Referral: Our team will provide resources and help connect to community based mental health treatment, when indicated, including psychotherapy, psychiatry, and other specialized behavioral health or substance use disorder services (for those not already in treatment). Transitional Care: Our team providers in person bridging and/or telephonic follow-up during the patient's transition to outpatient services.   [facility] 24-Hour Call Center: [phone] Behavioral Health Crisis Line: [phone]   Crissie Reese, MSW, Lenice Pressman Phone: [phone] Disposition/TOC

## 2022-07-06 NOTE — ED Notes (Signed)
Pt has been resting quietly, in no acute distress. Pt is safe. Respirations even and unlabored.

## 2022-07-06 NOTE — Progress Notes (Signed)
Order received for discharge. AVS printed, crisis services reviewed. Pt denies any SI, HI AVH.  Pt belongings returned, escorted to lobby to await ride.

## 2022-07-06 NOTE — Discharge Instructions (Addendum)
Substance Abuse Resources   [facility] - Admissions are currently completed Monday through Friday at 8am; both appointments and walk-ins are accepted.  Any individual that is a [place] resident may present for a substance abuse screening and assessment for admission.  A person may be referred by numerous sources or self-refer.   Potential clients will be screened for medical necessity and appropriateness for the program.  Clients must meet criteria for high-intensity residential treatment services.  If clinically appropriate, a client will continue with the comprehensive clinical assessment and intake process, as well as enrollment in the [facility].  Address: [address] [place], [place] [address] Admin Hours: Mon-Fri 8AM to 5PM Center Hours: 24/7 Phone: [phone] Fax: [phone]  [facility] Facility Based Crisis:  These are 3 locations for services for week and weekend: Please call before arrival:    [facility] ([facility])  Address: [address] [place], [place] [address] Phone: [phone]  [facility] ([facility]) Address: [address], [place], [place] [address] Phone#: [phone]  [facility] ([facility]) Address: [address] [place], [place], [place] [address] Phone#: [phone]   [facility] ([facility]): (offers outpatient therapy and intensive outpatient substance abuse therapy).  [address], [place], [place] [address] Phone: [phone]   Phone: [phone]  [facility] Intensive Outpatient Program (SAIOP) means structured individual and group addiction activities and services that are provided at an outpatient program designed to assist adult and adolescent consumers to begin recovery and learn skills for recovery maintenance. The [facility] SAIOP program is offered at least 3 hours a  day, 3 days a week. SAIOP services shall include a structured program consisting of, but not limited to, the following services: Individual counseling and support; Group counseling and support; Family counseling, training or support; Biochemical assays to identify recent drug use (e.g., urine drug screens); Strategies for relapse prevention to include community and social support systems in treatment; Life skills; Crisis contingency planning; Disease Management; and Treatment support activities that have been adapted or specifically designed for persons with physical disabilities, or persons with co-occurring disorders of mental illness and substance abuse/dependence or mental retardation/developmental disability and substance abuse/dependence.  Phone: [phone]   [facility])  Address: [address], [place], [place] [address] Phone: [phone]   [facility] Address: [address], [place], [place] [address] Phone: [phone]  - a combination of group and individual sessions to meet the participants needs. This allows participants to engage in treatment and remain involved in their home and work life. - Transitional housing places program participants in a supportive living environment while they complete a treatment program and work to secure independent housing. - The Substance Abuse Intensive Outpatient Treatment Program at [facility] consists of structured group sessions and individual sessions that are designed to teach participants early recovery and relapse prevention skills. -[facility] works with the [employer] to provide a housing and treatment program for homeless veterans.   [facility]   Address: [address] [place], [place] [address] Phone#: [phone]   : Referrals to RTSA facilities can be made by [facility] and [facility].  Referrals are also  accepted from physicians, private providers, hospital emergency rooms, family members, or any person who has knowledge of someone in the need of our services.    The [place] [facility] will also offer the following outpatient services: (Monday through Friday 8am-5pm)   Partial Hospitalization Program (PHP) Substance Abuse Intensive Outpatient Program (SA-IOP) Group Therapy Medication Management Peer Living Room We also provide (24/7):  Assessments: Our mental health clinician and providers will conduct a focused mental health evaluation, assessing for immediate safety concerns and further mental health needs. Referral: Our team will provide resources and help connect to community based mental health treatment, when indicated, including psychotherapy, psychiatry, and other specialized behavioral health or substance use disorder services (for those not already in treatment). Transitional Care: Our team providers in person bridging and/or telephonic follow-up during the patient's transition to outpatient services.     [facility] 24-Hour Call Center: [phone] Behavioral Health Crisis Line: [phone]  
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
## 2022-07-06 NOTE — ED Notes (Signed)
Pt sleeping at present, no distress noted.  Monitoring for safety. 

## 2022-07-07 LAB — HEMOGLOBIN A1C
Hgb A1c MFr Bld: 4.9 % (ref 4.8–5.6)
Mean Plasma Glucose: 94 mg/dL

## 2022-08-05 ENCOUNTER — Emergency Department (HOSPITAL_COMMUNITY): Payer: Commercial Managed Care - HMO

## 2022-08-05 ENCOUNTER — Encounter (HOSPITAL_COMMUNITY): Payer: Self-pay | Admitting: Emergency Medicine

## 2022-08-05 ENCOUNTER — Emergency Department (HOSPITAL_COMMUNITY)
Admission: EM | Admit: 2022-08-05 | Discharge: 2022-08-06 | Disposition: A | Discharge status: Home / Self Care | Payer: Commercial Managed Care - HMO | Attending: Emergency Medicine | Admitting: Emergency Medicine

## 2022-08-05 DIAGNOSIS — R1084 Generalized abdominal pain: Secondary | ICD-10-CM | POA: Insufficient documentation

## 2022-08-05 DIAGNOSIS — R1031 Right lower quadrant pain: Secondary | ICD-10-CM | POA: Diagnosis present

## 2022-08-05 DIAGNOSIS — G8929 Other chronic pain: Secondary | ICD-10-CM | POA: Diagnosis not present

## 2022-08-05 DIAGNOSIS — M25561 Pain in right knee: Secondary | ICD-10-CM | POA: Diagnosis not present

## 2022-08-05 LAB — CBC
HCT: 44.2 % (ref 39.0–52.0)
Hemoglobin: 14.7 g/dL (ref 13.0–17.0)
MCH: 34.6 pg — ABNORMAL HIGH (ref 26.0–34.0)
MCHC: 33.3 g/dL (ref 30.0–36.0)
MCV: 104 fL — ABNORMAL HIGH (ref 80.0–100.0)
Platelets: 209 10*3/uL (ref 150–400)
RBC: 4.25 MIL/uL (ref 4.22–5.81)
RDW: 11.9 % (ref 11.5–15.5)
WBC: 6.7 10*3/uL (ref 4.0–10.5)
nRBC: 0 % (ref 0.0–0.2)

## 2022-08-05 LAB — COMPREHENSIVE METABOLIC PANEL
ALT: 15 U/L (ref 0–44)
AST: 27 U/L (ref 15–41)
Albumin: 3.9 g/dL (ref 3.5–5.0)
Alkaline Phosphatase: 64 U/L (ref 38–126)
Anion gap: 8 (ref 5–15)
BUN: 8 mg/dL (ref 6–20)
CO2: 26 mmol/L (ref 22–32)
Calcium: 9 mg/dL (ref 8.9–10.3)
Chloride: 102 mmol/L (ref 98–111)
Creatinine, Ser: 1.01 mg/dL (ref 0.61–1.24)
GFR, Estimated: >60 mL/min (ref >60)
Glucose, Bld: 145 mg/dL — ABNORMAL HIGH (ref 70–99)
Potassium: 3.9 mmol/L (ref 3.5–5.1)
Sodium: 136 mmol/L (ref 135–145)
Total Bilirubin: 0.5 mg/dL (ref 0.3–1.2)
Total Protein: 7.1 g/dL (ref 6.5–8.1)

## 2022-08-05 LAB — URINALYSIS, ROUTINE W REFLEX MICROSCOPIC
Bilirubin Urine: NEGATIVE
Glucose, UA: 50 mg/dL — AB
Ketones, ur: NEGATIVE mg/dL
Nitrite: NEGATIVE
Protein, ur: 30 mg/dL — AB
Specific Gravity, Urine: 1.025 (ref 1.005–1.030)
pH: 6 (ref 5.0–8.0)

## 2022-08-05 LAB — LIPASE, BLOOD: Lipase: 49 U/L (ref 11–51)
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
## 2022-08-06 ENCOUNTER — Emergency Department (HOSPITAL_COMMUNITY): Payer: Commercial Managed Care - HMO

## 2022-08-06 MED ORDER — KETOROLAC TROMETHAMINE 15 MG/ML IJ SOLN
15.0000 mg | Freq: Once | INTRAMUSCULAR | Status: AC
  Administered 2022-08-06: 15 mg via INTRAVENOUS
  Filled 2022-08-06: qty 1

## 2022-08-06 MED ORDER — IOHEXOL 350 MG/ML SOLN
75.0000 mL | Freq: Once | INTRAVENOUS | Status: AC | PRN
  Administered 2022-08-06: 75 mL via INTRAVENOUS
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
## 2022-09-18 ENCOUNTER — Other Ambulatory Visit (HOSPITAL_COMMUNITY)
Admission: EM | Admit: 2022-09-18 | Discharge: 2022-09-20 | Disposition: A | Discharge status: Home / Self Care | Payer: Commercial Managed Care - HMO | Attending: Psychiatry | Admitting: Psychiatry

## 2022-09-18 DIAGNOSIS — R4589 Other symptoms and signs involving emotional state: Secondary | ICD-10-CM | POA: Diagnosis not present

## 2022-09-18 DIAGNOSIS — F419 Anxiety disorder, unspecified: Secondary | ICD-10-CM | POA: Diagnosis not present

## 2022-09-18 DIAGNOSIS — F1410 Cocaine abuse, uncomplicated: Secondary | ICD-10-CM | POA: Insufficient documentation

## 2022-09-18 DIAGNOSIS — F1010 Alcohol abuse, uncomplicated: Secondary | ICD-10-CM | POA: Insufficient documentation

## 2022-09-18 DIAGNOSIS — Z1152 Encounter for screening for COVID-19: Secondary | ICD-10-CM | POA: Insufficient documentation

## 2022-09-18 DIAGNOSIS — F1910 Other psychoactive substance abuse, uncomplicated: Secondary | ICD-10-CM | POA: Diagnosis present

## 2022-09-18 LAB — POCT URINE DRUG SCREEN - MANUAL ENTRY (I-SCREEN)
POC Amphetamine UR: NOT DETECTED
POC Buprenorphine (BUP): NOT DETECTED
POC Cocaine UR: NOT DETECTED
POC Marijuana UR: POSITIVE — AB
POC Methadone UR: NOT DETECTED
POC Methamphetamine UR: POSITIVE — AB
POC Morphine: NOT DETECTED
POC Oxazepam (BZO): NOT DETECTED
POC Oxycodone UR: POSITIVE — AB
POC Secobarbital (BAR): NOT DETECTED

## 2022-09-18 LAB — POC SARS CORONAVIRUS 2 AG: SARSCOV2ONAVIRUS 2 AG: NEGATIVE

## 2022-09-18 MED ORDER — ONDANSETRON 4 MG PO TBDP: 4.0000 mg | ORAL_TABLET | Freq: Four times a day (QID) | ORAL | Status: DC | PRN

## 2022-09-18 MED ORDER — CLONIDINE HCL 0.1 MG PO TABS
0.1000 mg | ORAL_TABLET | Freq: Four times a day (QID) | ORAL | Status: DC
  Administered 2022-09-18 – 2022-09-19 (×3): 0.1 mg via ORAL
  Filled 2022-09-18 (×3): qty 1

## 2022-09-18 MED ORDER — HYDROXYZINE HCL 25 MG PO TABS: 25.0000 mg | ORAL_TABLET | Freq: Four times a day (QID) | ORAL | Status: DC | PRN

## 2022-09-18 MED ORDER — CLONIDINE HCL 0.1 MG PO TABS: 0.1000 mg | ORAL_TABLET | ORAL | Status: DC

## 2022-09-18 MED ORDER — CLONIDINE HCL 0.1 MG PO TABS: 0.1000 mg | ORAL_TABLET | Freq: Every day | ORAL | Status: DC

## 2022-09-18 MED ORDER — MAGNESIUM HYDROXIDE 400 MG/5ML PO SUSP: 30.0000 mL | Freq: Every day | ORAL | Status: DC | PRN

## 2022-09-18 MED ORDER — LOPERAMIDE HCL 2 MG PO CAPS: 2.0000 mg | ORAL_CAPSULE | ORAL | Status: DC | PRN

## 2022-09-18 MED ORDER — ZIPRASIDONE MESYLATE 20 MG IM SOLR: 20.0000 mg | INTRAMUSCULAR | Status: DC | PRN

## 2022-09-18 MED ORDER — LORAZEPAM 1 MG PO TABS: 1.0000 mg | ORAL_TABLET | ORAL | Status: DC | PRN

## 2022-09-18 MED ORDER — OLANZAPINE 10 MG PO TBDP: 10.0000 mg | ORAL_TABLET | Freq: Three times a day (TID) | ORAL | Status: DC | PRN

## 2022-09-18 MED ORDER — NAPROXEN 500 MG PO TABS: 500.0000 mg | ORAL_TABLET | Freq: Two times a day (BID) | ORAL | Status: DC | PRN

## 2022-09-18 MED ORDER — ALUM & MAG HYDROXIDE-SIMETH 200-200-20 MG/5ML PO SUSP: 30.0000 mL | ORAL | Status: DC | PRN

## 2022-09-18 MED ORDER — DICYCLOMINE HCL 20 MG PO TABS: 20.0000 mg | ORAL_TABLET | Freq: Four times a day (QID) | ORAL | Status: DC | PRN

## 2022-09-18 MED ORDER — METHOCARBAMOL 500 MG PO TABS: 500.0000 mg | ORAL_TABLET | Freq: Three times a day (TID) | ORAL | Status: DC | PRN
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
## 2022-09-18 NOTE — Discharge Instructions (Addendum)
Patient requested discharge. Will follow up with substance abuse rehab by himself. Resources provided.  agreeable to plan.  Given opportunity to ask questions.  Appears to feel comfortable with discharge denies any current suicidal or homicidal thought. Patient is also instructed prior to discharge to: Take all medications as prescribed by his mental healthcare provider. Report any adverse effects and or reactions from the medicines to his outpatient provider promptly. Patient has been instructed & cautioned: To not engage in alcohol and or illegal drug use while on prescription medicines. In the event of worsening symptoms, patient is instructed to call the crisis hotline, 911 and or go to the nearest ED for appropriate evaluation and treatment of symptoms. To follow-up with his primary care provider for your other medical issues, concerns and or health care needs.

## 2022-09-18 NOTE — ED Notes (Signed)
Pt  admitted to [facility]  requesting substance abuse treatment.  Pt denies SI, HI, AVH, at present. Patient was cooperative during the admission assessment. Skin assessment complete. Belongings in the [place]. Patient oriented to unit and unit rules. Meal and drinks offered to patient.  Patient verbalized agreement to treatment plans. Patient verbally contracts for safety while hospitalized. Will monitor for safety.

## 2022-09-18 NOTE — ED Provider Notes (Signed)
Facility Based Crisis Admission H&P  Date: 09/19/22 Patient Name: Calvin [name] MRN: [id number] Chief Complaint: been doing drug since a teenager   Diagnoses:  Final diagnoses:  Alcohol abuse  Cocaine abuse ([place])  Polysubstance abuse ([place])  Anxious appearance    HPI: Calvin [name],  37 y/o male with a history of alcohol and polysubstance abuse,  presented to [facility] voluntarily.  Per the patient he wants to detox from drugs and alcohol.  According to the patient he has been doing drugs and alcohol every day sometimes every other day since he was a teenager.  Per the patient I want to get my life together and 37 years old.  Per the patient he is unemployed, lives alone.  Patient was last admitted for rehab in December of last year.  Patient is very guarded, refused to answer questions at times get very agitated when asked specific questions.  According to patient he wants to get a room and he wants to be here for a long time not just resources or outpatient.  Patient does appear to be homeless however he did not state that.  Face-to-face observation of patient, patient is alert and oriented x 4, speech is clear, maintaining minimal eye contact.  Patient can be very agitated when asked a question very guarded at times.  Per the patient he last used cocaine and mushroom today and last used alcohol twice within the last 24 hours.  Patient denies SI, HI, AVH or paranoia at this time.  Recommend [place] admission.  PHQ 2-9:  [place] ED from 11/05/2021 in [facility]  Thoughts that you would be better off dead, or of hurting yourself in some way Not at all  PHQ-9 Total Score 13       [place] ED from 09/18/2022 in [facility] ED from 08/05/2022 in [facility] Emergency Department at [facility] ED from 07/05/2022 in [place] No Risk No Risk No Risk        Total Time  spent with patient: 20 minutes  Musculoskeletal  Strength & Muscle Tone: within normal limits Gait & Station: normal Patient leans: N/A  Psychiatric Specialty Exam  Presentation General Appearance:  Casual  Eye Contact: Fair  Speech: Clear and Coherent  Speech Volume: Normal  Handedness: Right   Mood and Affect  Mood: Anxious; Angry  Affect: Blunt; Constricted   Thought Process  Thought Processes: Linear  Descriptions of Associations:Intact  Orientation:Full (Time, Place and Person)  Thought Content:WDL  Diagnosis of Schizophrenia or Schizoaffective disorder in past: No   Hallucinations:Hallucinations: None  Ideas of Reference:None  Suicidal Thoughts:Suicidal Thoughts: No  Homicidal Thoughts:Homicidal Thoughts: No   Sensorium  Memory: Immediate Good  Judgment: Poor  Insight: Poor   Executive Functions  Concentration: Good  Attention Span: Good  Recall: Good  Fund of Knowledge: Good  Language: Good   Psychomotor Activity  Psychomotor Activity: Psychomotor Activity: Normal   Assets  Assets: Desire for Improvement; Social Support   Sleep  Sleep: Sleep: Fair Number of Hours of Sleep: 6   Nutritional Assessment (For OBS and FBC admissions only) Has the patient had a weight loss or gain of 10 pounds or more in the last 3 months?: No Has the patient had a decrease in food intake/or appetite?: No Does the patient have dental problems?: No Does the patient have eating habits or behaviors that may be indicators of an eating  disorder including binging or inducing vomiting?: No Has the patient recently lost weight without trying?: 0 Has the patient been eating poorly because of a decreased appetite?: 0 Malnutrition Screening Tool Score: 0    Physical Exam HENT:     Head: Normocephalic.     Nose: Nose normal.  Cardiovascular:     Rate and Rhythm: Normal rate.  Pulmonary:     Effort: Pulmonary effort is normal.   Musculoskeletal:        General: Normal range of motion.     Cervical back: Normal range of motion.  Neurological:     General: No focal deficit present.     Mental Status: He is alert.  Psychiatric:        Mood and Affect: Mood normal.        Behavior: Behavior normal.        Thought Content: Thought content normal.        Judgment: Judgment normal.    Review of Systems  Constitutional: Negative.   HENT: Negative.    Eyes: Negative.   Respiratory: Negative.    Cardiovascular: Negative.   Gastrointestinal: Negative.   Genitourinary: Negative.   Musculoskeletal: Negative.   Skin: Negative.   Neurological: Negative.   Psychiatric/Behavioral:  Positive for substance abuse. The patient is nervous/anxious.     Blood pressure (!) 134/90, pulse 63, temperature 98.3 F (36.8 C), temperature source Oral, resp. rate 20, SpO2 99 %. There is no height or weight on file to calculate BMI.  Past Psychiatric History: Alcohol, polysubstance abuse.  Is the patient at risk to self? No  Has the patient been a risk to self in the past 6 months? No .    Has the patient been a risk to self within the distant past? No   Is the patient a risk to others? No   Has the patient been a risk to others in the past 6 months? No   Has the patient been a risk to others within the distant past? No   Past Medical History: See chart Family History: Unknown Social History: Cocaine/mushroom/alcohol abuse  Last Labs:  Admission on 09/18/2022  Component Date Value Ref Range Status   SARS Coronavirus 2 by RT PCR 09/18/2022 NEGATIVE  NEGATIVE Final   Influenza A by PCR 09/18/2022 NEGATIVE  NEGATIVE Final   Influenza B by PCR 09/18/2022 NEGATIVE  NEGATIVE Final   Comment: (NOTE) The Xpert Xpress SARS-CoV-2/FLU/RSV plus assay is intended as an aid in the diagnosis of influenza from Nasopharyngeal swab specimens and should not be used as a sole basis for treatment. Nasal washings and aspirates are  unacceptable for Xpert Xpress SARS-CoV-2/FLU/RSV testing.  Fact Sheet for Patients: EntrepreneurPulse.com.au  Fact Sheet for Healthcare Providers: IncredibleEmployment.be  This test is not yet approved or cleared by the [place] FDA and has been authorized for detection and/or diagnosis of SARS-CoV-2 by FDA under an Emergency Use Authorization (EUA). This EUA will remain in effect (meaning this test can be used) for the duration of the COVID-19 declaration under Section 564(b)(1) of the Act, 21 U.S.C. section 360bbb-3(b)(1), unless the authorization is terminated or revoked.     Resp Syncytial Virus by PCR 09/18/2022 NEGATIVE  NEGATIVE Final   Comment: (NOTE) Fact Sheet for Patients: EntrepreneurPulse.com.au  Fact Sheet for Healthcare Providers: IncredibleEmployment.be  This test is not yet approved or cleared by the [place] FDA and has been authorized for detection and/or diagnosis of SARS-CoV-2 by FDA under an Emergency Use Authorization (  EUA). This EUA will remain in effect (meaning this test can be used) for the duration of the COVID-19 declaration under Section 564(b)(1) of the Act, 21 U.S.C. section 360bbb-3(b)(1), unless the authorization is terminated or revoked.  Performed at [facility], [place] [address], [place], [place] [address]    WBC 09/18/2022 6.7  4.0 - 10.5 K/uL Final   RBC 09/18/2022 4.42  4.22 - 5.81 MIL/uL Final   Hemoglobin 09/18/2022 15.4  13.0 - 17.0 g/dL Final   HCT 09/18/2022 44.6  39.0 - 52.0 % Final   MCV 09/18/2022 100.9 (H)  80.0 - 100.0 fL Final   MCH 09/18/2022 34.8 (H)  26.0 - 34.0 pg Final   MCHC 09/18/2022 34.5  30.0 - 36.0 g/dL Final   RDW 09/18/2022 11.9  11.5 - 15.5 % Final   Platelets 09/18/2022 219  150 - 400 K/uL Final   nRBC 09/18/2022 0.0  0.0 - 0.2 % Final   Neutrophils Relative % 09/18/2022 53  % Final   Neutro Abs 09/18/2022 3.5  1.7 - 7.7  K/uL Final   Lymphocytes Relative 09/18/2022 39  % Final   Lymphs Abs 09/18/2022 2.6  0.7 - 4.0 K/uL Final   Monocytes Relative 09/18/2022 7  % Final   Monocytes Absolute 09/18/2022 0.5  0.1 - 1.0 K/uL Final   Eosinophils Relative 09/18/2022 1  % Final   Eosinophils Absolute 09/18/2022 0.1  0.0 - 0.5 K/uL Final   Basophils Relative 09/18/2022 0  % Final   Basophils Absolute 09/18/2022 0.0  0.0 - 0.1 K/uL Final   Immature Granulocytes 09/18/2022 0  % Final   Abs Immature Granulocytes 09/18/2022 0.01  0.00 - 0.07 K/uL Final   Performed at [facility], [place] [address] [place], [place] [address]   Sodium 09/18/2022 139  135 - 145 mmol/L Final   Potassium 09/18/2022 4.0  3.5 - 5.1 mmol/L Final   Chloride 09/18/2022 99  98 - 111 mmol/L Final   CO2 09/18/2022 27  22 - 32 mmol/L Final   Glucose, Bld 09/18/2022 62 (L)  70 - 99 mg/dL Final   Glucose reference range applies only to samples taken after fasting for at least 8 hours.   BUN 09/18/2022 9  6 - 20 mg/dL Final   Creatinine, Ser 09/18/2022 0.97  0.61 - 1.24 mg/dL Final   Calcium 09/18/2022 9.4  8.9 - 10.3 mg/dL Final   Total Protein 09/18/2022 8.2 (H)  6.5 - 8.1 g/dL Final   Albumin 09/18/2022 4.4  3.5 - 5.0 g/dL Final   AST 09/18/2022 34  15 - 41 U/L Final   ALT 09/18/2022 23  0 - 44 U/L Final   Alkaline Phosphatase 09/18/2022 67  38 - 126 U/L Final   Total Bilirubin 09/18/2022 0.6  0.3 - 1.2 mg/dL Final   GFR, Estimated 09/18/2022 >60  >60 mL/min Final   Comment: (NOTE) Calculated using the CKD-EPI Creatinine Equation (2021)    Anion gap 09/18/2022 13  5 - 15 Final   Performed at [facility], [place] [address], [place], [place] [address]   Cholesterol 09/18/2022 135  0 - 200 mg/dL Final   Triglycerides 09/18/2022 72  <150 mg/dL Final   HDL 09/18/2022 72  >40 mg/dL Final   Total CHOL/HDL Ratio 09/18/2022 1.9  RATIO Final   VLDL 09/18/2022 14  0 - 40 mg/dL Final   LDL Cholesterol 09/18/2022 49  0 - 99 mg/dL Final    Comment:  Total Cholesterol/HDL:CHD Risk Coronary Heart Disease Risk Table                     Men   Women  1/2 Average Risk   3.4   3.3  Average Risk       5.0   4.4  2 X Average Risk   9.6   7.1  3 X Average Risk  23.4   11.0        Use the calculated Patient Ratio above and the CHD Risk Table to determine the patient's CHD Risk.        ATP III CLASSIFICATION (LDL):  <100     mg/dL   Optimal  100-129  mg/dL   Near or Above                    Optimal  130-159  mg/dL   Borderline  160-189  mg/dL   High  >190     mg/dL   Very High Performed at [facility] [address] [place], [place] [address]    TSH 09/18/2022 1.267  0.350 - 4.500 uIU/mL Final   Comment: Performed by a 3rd Generation assay with a functional sensitivity of <=0.01 uIU/mL. Performed at [facility], [place] [address], [place], [place] [address]    POC Amphetamine UR 09/18/2022 None Detected  NONE DETECTED (Cut Off Level 1000 ng/mL) Preliminary   POC Secobarbital (BAR) 09/18/2022 None Detected  NONE DETECTED (Cut Off Level 300 ng/mL) Preliminary   POC Buprenorphine (BUP) 09/18/2022 None Detected  NONE DETECTED (Cut Off Level 10 ng/mL) Preliminary   POC Oxazepam (BZO) 09/18/2022 None Detected  NONE DETECTED (Cut Off Level 300 ng/mL) Preliminary   POC Cocaine UR 09/18/2022 None Detected  NONE DETECTED (Cut Off Level 300 ng/mL) Preliminary   POC Methamphetamine UR 09/18/2022 Positive (A)  NONE DETECTED (Cut Off Level 1000 ng/mL) Preliminary   POC Morphine 09/18/2022 None Detected  NONE DETECTED (Cut Off Level 300 ng/mL) Preliminary   POC Methadone UR 09/18/2022 None Detected  NONE DETECTED (Cut Off Level 300 ng/mL) Preliminary   POC Oxycodone UR 09/18/2022 Positive (A)  NONE DETECTED (Cut Off Level 100 ng/mL) Preliminary   POC Marijuana UR 09/18/2022 Positive (A)  NONE DETECTED (Cut Off Level 50 ng/mL) Preliminary   SARSCOV2ONAVIRUS 2 AG 09/18/2022 NEGATIVE  NEGATIVE Final   Comment:  (NOTE) SARS-CoV-2 antigen NOT DETECTED.   Negative results are presumptive.  Negative results do not preclude SARS-CoV-2 infection and should not be used as the sole basis for treatment or other patient management decisions, including infection  control decisions, particularly in the presence of clinical signs and  symptoms consistent with COVID-19, or in those who have been in contact with the virus.  Negative results must be combined with clinical observations, patient history, and epidemiological information. The expected result is Negative.  Fact Sheet for Patients: HandmadeRecipes.com.cy  Fact Sheet for Healthcare Providers: FuneralLife.at  This test is not yet approved or cleared by the [place] FDA and  has been authorized for detection and/or diagnosis of SARS-CoV-2 by FDA under an Emergency Use Authorization (EUA).  This EUA will remain in effect (meaning this test can be used) for the duration of  the COV                          ID-19 declaration under Section 564(b)(1) of the Act, 21 U.S.C. section 360bbb-3(b)(1), unless  the authorization is terminated or revoked sooner.    Admission on 08/05/2022, Discharged on 08/06/2022  Component Date Value Ref Range Status   Lipase 08/05/2022 49  11 - 51 U/L Final   Performed at [facility] [address], [place], [place] [address]   Sodium 08/05/2022 136  135 - 145 mmol/L Final   Potassium 08/05/2022 3.9  3.5 - 5.1 mmol/L Final   Chloride 08/05/2022 102  98 - 111 mmol/L Final   CO2 08/05/2022 26  22 - 32 mmol/L Final   Glucose, Bld 08/05/2022 145 (H)  70 - 99 mg/dL Final   Glucose reference range applies only to samples taken after fasting for at least 8 hours.   BUN 08/05/2022 8  6 - 20 mg/dL Final   Creatinine, Ser 08/05/2022 1.01  0.61 - 1.24 mg/dL Final   Calcium 08/05/2022 9.0  8.9 - 10.3 mg/dL Final   Total Protein 08/05/2022 7.1  6.5 - 8.1 g/dL Final    Albumin 08/05/2022 3.9  3.5 - 5.0 g/dL Final   AST 08/05/2022 27  15 - 41 U/L Final   ALT 08/05/2022 15  0 - 44 U/L Final   Alkaline Phosphatase 08/05/2022 64  38 - 126 U/L Final   Total Bilirubin 08/05/2022 0.5  0.3 - 1.2 mg/dL Final   GFR, Estimated 08/05/2022 >60  >60 mL/min Final   Comment: (NOTE) Calculated using the CKD-EPI Creatinine Equation (2021)    Anion gap 08/05/2022 8  5 - 15 Final   Performed at [facility], [place] [address] [place], [place] [address]   WBC 08/05/2022 6.7  4.0 - 10.5 K/uL Final   RBC 08/05/2022 4.25  4.22 - 5.81 MIL/uL Final   Hemoglobin 08/05/2022 14.7  13.0 - 17.0 g/dL Final   HCT 08/05/2022 44.2  39.0 - 52.0 % Final   MCV 08/05/2022 104.0 (H)  80.0 - 100.0 fL Final   MCH 08/05/2022 34.6 (H)  26.0 - 34.0 pg Final   MCHC 08/05/2022 33.3  30.0 - 36.0 g/dL Final   RDW 08/05/2022 11.9  11.5 - 15.5 % Final   Platelets 08/05/2022 209  150 - 400 K/uL Final   nRBC 08/05/2022 0.0  0.0 - 0.2 % Final   Performed at [facility] [address], [place], [place] [address]   Color, Urine 08/05/2022 YELLOW  YELLOW Final   APPearance 08/05/2022 CLEAR  CLEAR Final   Specific Gravity, Urine 08/05/2022 1.025  1.005 - 1.030 Final   pH 08/05/2022 6.0  5.0 - 8.0 Final   Glucose, UA 08/05/2022 50 (A)  NEGATIVE mg/dL Final   Hgb urine dipstick 08/05/2022 SMALL (A)  NEGATIVE Final   Bilirubin Urine 08/05/2022 NEGATIVE  NEGATIVE Final   Ketones, ur 08/05/2022 NEGATIVE  NEGATIVE mg/dL Final   Protein, ur 08/05/2022 30 (A)  NEGATIVE mg/dL Final   Nitrite 08/05/2022 NEGATIVE  NEGATIVE Final   Leukocytes,Ua 08/05/2022 SMALL (A)  NEGATIVE Final   RBC / HPF 08/05/2022 6-10  0 - 5 RBC/hpf Final   WBC, UA 08/05/2022 11-20  0 - 5 WBC/hpf Final   Bacteria, UA 08/05/2022 RARE (A)  NONE SEEN Final   Squamous Epithelial / HPF 08/05/2022 0-5  0 - 5 /HPF Final   Mucus 08/05/2022 PRESENT   Final   Hyaline Casts, UA 08/05/2022 PRESENT   Final   Performed at [facility], [place] [address], [place], [place] [address]  Admission on 07/05/2022, Discharged on  07/06/2022  Component Date Value Ref Range Status   SARS Coronavirus 2 by RT PCR 07/05/2022 NEGATIVE  NEGATIVE Final   Comment: (NOTE) SARS-CoV-2 target nucleic acids are NOT DETECTED.  The SARS-CoV-2 RNA is generally detectable in upper respiratory specimens during the acute phase of infection. The lowest concentration of SARS-CoV-2 viral copies this assay can detect is 138 copies/mL. A negative result does not preclude SARS-Cov-2 infection and should not be used as the sole basis for treatment or other patient management decisions. A negative result may occur with  improper specimen collection/handling, submission of specimen other than nasopharyngeal swab, presence of viral mutation(s) within the areas targeted by this assay, and inadequate number of viral copies(<138 copies/mL). A negative result must be combined with clinical observations, patient history, and epidemiological information. The expected result is Negative.  Fact Sheet for Patients:  EntrepreneurPulse.com.au  Fact Sheet for Healthcare Providers:  IncredibleEmployment.be  This test is no                          t yet approved or cleared by the [place] FDA and  has been authorized for detection and/or diagnosis of SARS-CoV-2 by FDA under an Emergency Use Authorization (EUA). This EUA will remain  in effect (meaning this test can be used) for the duration of the COVID-19 declaration under Section 564(b)(1) of the Act, 21 U.S.C.section 360bbb-3(b)(1), unless the authorization is terminated  or revoked sooner.       Influenza A by PCR 07/05/2022 NEGATIVE  NEGATIVE Final   Influenza B by PCR 07/05/2022 NEGATIVE  NEGATIVE Final   Comment: (NOTE) The Xpert Xpress SARS-CoV-2/FLU/RSV plus assay is intended as an aid in the diagnosis of influenza from Nasopharyngeal swab specimens  and should not be used as a sole basis for treatment. Nasal washings and aspirates are unacceptable for Xpert Xpress SARS-CoV-2/FLU/RSV testing.  Fact Sheet for Patients: EntrepreneurPulse.com.au  Fact Sheet for Healthcare Providers: IncredibleEmployment.be  This test is not yet approved or cleared by the [place] FDA and has been authorized for detection and/or diagnosis of SARS-CoV-2 by FDA under an Emergency Use Authorization (EUA). This EUA will remain in effect (meaning this test can be used) for the duration of the COVID-19 declaration under Section 564(b)(1) of the Act, 21 U.S.C. section 360bbb-3(b)(1), unless the authorization is terminated or revoked.     Resp Syncytial Virus by PCR 07/05/2022 NEGATIVE  NEGATIVE Final   Comment: (NOTE) Fact Sheet for Patients: EntrepreneurPulse.com.au  Fact Sheet for Healthcare Providers: IncredibleEmployment.be  This test is not yet approved or cleared by the [place] FDA and has been authorized for detection and/or diagnosis of SARS-CoV-2 by FDA under an Emergency Use Authorization (EUA). This EUA will remain in effect (meaning this test can be used) for the duration of the COVID-19 declaration under Section 564(b)(1) of the Act, 21 U.S.C. section 360bbb-3(b)(1), unless the authorization is terminated or revoked.  Performed at [facility], [place] [address], [place], [place] [address]    WBC 07/05/2022 6.9  4.0 - 10.5 K/uL Final   RBC 07/05/2022 3.75 (L)  4.22 - 5.81 MIL/uL Final   Hemoglobin 07/05/2022 13.3  13.0 - 17.0 g/dL Final   HCT 07/05/2022 37.9 (L)  39.0 - 52.0 % Final   MCV 07/05/2022 101.1 (H)  80.0 - 100.0 fL Final   MCH 07/05/2022 35.5 (H)  26.0 - 34.0 pg Final   MCHC 07/05/2022 35.1  30.0 - 36.0 g/dL  Final   RDW 07/05/2022 12.4  11.5 - 15.5 % Final   Platelets 07/05/2022 199  150 - 400 K/uL Final   nRBC 07/05/2022 0.0  0.0 - 0.2  % Final   Neutrophils Relative % 07/05/2022 47  % Final   Neutro Abs 07/05/2022 3.2  1.7 - 7.7 K/uL Final   Lymphocytes Relative 07/05/2022 42  % Final   Lymphs Abs 07/05/2022 2.9  0.7 - 4.0 K/uL Final   Monocytes Relative 07/05/2022 9  % Final   Monocytes Absolute 07/05/2022 0.7  0.1 - 1.0 K/uL Final   Eosinophils Relative 07/05/2022 1  % Final   Eosinophils Absolute 07/05/2022 0.1  0.0 - 0.5 K/uL Final   Basophils Relative 07/05/2022 1  % Final   Basophils Absolute 07/05/2022 0.1  0.0 - 0.1 K/uL Final   Immature Granulocytes 07/05/2022 0  % Final   Abs Immature Granulocytes 07/05/2022 0.01  0.00 - 0.07 K/uL Final   Performed at [facility], [place] [address], [place], [place] [address]   Sodium 07/05/2022 140  135 - 145 mmol/L Final   Potassium 07/05/2022 3.7  3.5 - 5.1 mmol/L Final   Chloride 07/05/2022 102  98 - 111 mmol/L Final   CO2 07/05/2022 27  22 - 32 mmol/L Final   Glucose, Bld 07/05/2022 97  70 - 99 mg/dL Final   Glucose reference range applies only to samples taken after fasting for at least 8 hours.   BUN 07/05/2022 9  6 - 20 mg/dL Final   Creatinine, Ser 07/05/2022 1.13  0.61 - 1.24 mg/dL Final   Calcium 07/05/2022 9.3  8.9 - 10.3 mg/dL Final   Total Protein 07/05/2022 7.0  6.5 - 8.1 g/dL Final   Albumin 07/05/2022 4.1  3.5 - 5.0 g/dL Final   AST 07/05/2022 36  15 - 41 U/L Final   ALT 07/05/2022 22  0 - 44 U/L Final   Alkaline Phosphatase 07/05/2022 57  38 - 126 U/L Final   Total Bilirubin 07/05/2022 0.4  0.3 - 1.2 mg/dL Final   GFR, Estimated 07/05/2022 >60  >60 mL/min Final   Comment: (NOTE) Calculated using the CKD-EPI Creatinine Equation (2021)    Anion gap 07/05/2022 11  5 - 15 Final   Performed at [facility] [address] [place], [place] [address]   Hgb A1c MFr Bld 07/05/2022 4.9  4.8 - 5.6 % Final   Comment: (NOTE)         Prediabetes: 5.7 - 6.4         Diabetes: >6.4         Glycemic control for adults with diabetes: <7.0    Mean Plasma  Glucose 07/05/2022 94  mg/dL Final   Comment: (NOTE) Performed At: [facility] [place], [place] [id number] Rush Farmer MD [id number]    Alcohol, Ethyl (B) 07/05/2022 <10  <10 mg/dL Final   Comment: (NOTE) Lowest detectable limit for serum alcohol is 10 mg/dL.  For medical purposes only. Performed at [facility], [place] [address] [place], [place] [address]    Cholesterol 07/05/2022 111  0 - 200 mg/dL Final   Triglycerides 07/05/2022 38  <150 mg/dL Final   HDL 07/05/2022 71  >40 mg/dL Final   Total CHOL/HDL Ratio 07/05/2022 1.6  RATIO Final   VLDL 07/05/2022 8  0 - 40 mg/dL Final   LDL Cholesterol 07/05/2022 32  0 - 99 mg/dL Final   Comment:  Total Cholesterol/HDL:CHD Risk Coronary Heart Disease Risk Table                     Men   Women  1/2 Average Risk   3.4   3.3  Average Risk       5.0   4.4  2 X Average Risk   9.6   7.1  3 X Average Risk  23.4   11.0        Use the calculated Patient Ratio above and the CHD Risk Table to determine the patient's CHD Risk.        ATP III CLASSIFICATION (LDL):  <100     mg/dL   Optimal  100-129  mg/dL   Near or Above                    Optimal  130-159  mg/dL   Borderline  160-189  mg/dL   High  >190     mg/dL   Very High Performed at [facility] [address] [place], [place] [address]    TSH 07/05/2022 0.914  0.350 - 4.500 uIU/mL Final   Comment: Performed by a 3rd Generation assay with a functional sensitivity of <=0.01 uIU/mL. Performed at [facility], [place] [address] [place], [place] [address]    SARSCOV2ONAVIRUS 2 AG 07/05/2022 NEGATIVE  NEGATIVE Final   Comment: (NOTE) SARS-CoV-2 antigen NOT DETECTED.   Negative results are presumptive.  Negative results do not preclude SARS-CoV-2 infection and should not be used as the sole basis for treatment or other patient management decisions, including infection  control decisions, particularly in the presence of clinical  signs and  symptoms consistent with COVID-19, or in those who have been in contact with the virus.  Negative results must be combined with clinical observations, patient history, and epidemiological information. The expected result is Negative.  Fact Sheet for Patients: HandmadeRecipes.com.cy  Fact Sheet for Healthcare Providers: FuneralLife.at  This test is not yet approved or cleared by the [place] FDA and  has been authorized for detection and/or diagnosis of SARS-CoV-2 by FDA under an Emergency Use Authorization (EUA).  This EUA will remain in effect (meaning this test can be used) for the duration of  the COV                          ID-19 declaration under Section 564(b)(1) of the Act, 21 U.S.C. section 360bbb-3(b)(1), unless the authorization is terminated or revoked sooner.    Admission on 05/16/2022, Discharged on 05/17/2022  Component Date Value Ref Range Status   Sodium 05/17/2022 135  135 - 145 mmol/L Final   Potassium 05/17/2022 3.7  3.5 - 5.1 mmol/L Final   Chloride 05/17/2022 102  98 - 111 mmol/L Final   CO2 05/17/2022 25  22 - 32 mmol/L Final   Glucose, Bld 05/17/2022 152 (H)  70 - 99 mg/dL Final   Glucose reference range applies only to samples taken after fasting for at least 8 hours.   BUN 05/17/2022 22 (H)  6 - 20 mg/dL Final   Creatinine, Ser 05/17/2022 1.41 (H)  0.61 - 1.24 mg/dL Final   Calcium 05/17/2022 9.2  8.9 - 10.3 mg/dL Final   Total Protein 05/17/2022 8.5 (H)  6.5 - 8.1 g/dL Final   Albumin 05/17/2022 4.8  3.5 - 5.0 g/dL Final   AST 05/17/2022 47 (H)  15 - 41 U/L Final  ALT 05/17/2022 30  0 - 44 U/L Final   Alkaline Phosphatase 05/17/2022 65  38 - 126 U/L Final   Total Bilirubin 05/17/2022 0.7  0.3 - 1.2 mg/dL Final   GFR, Estimated 05/17/2022 >60  >60 mL/min Final   Comment: (NOTE) Calculated using the CKD-EPI Creatinine Equation (2021)    Anion gap 05/17/2022 8  5 - 15 Final   Performed at  [facility] [place] [address] [place], [place] [address]   Alcohol, Ethyl (B) 05/17/2022 <10  <10 mg/dL Final   Comment: (NOTE) Lowest detectable limit for serum alcohol is 10 mg/dL.  For medical purposes only. Performed at [facility], [place] [address] [place], [place] [id number]    Salicylate Lvl 99991111 <7.0 (L)  7.0 - 30.0 mg/dL Final   Performed at [place] [address], [place], [place] [address]   Acetaminophen (Tylenol), Serum 05/17/2022 <10 (L)  10 - 30 ug/mL Final   Comment: (NOTE) Therapeutic concentrations vary significantly. A range of 10-30 ug/mL  may be an effective concentration for many patients. However, some  are best treated at concentrations outside of this range. Acetaminophen concentrations >150 ug/mL at 4 hours after ingestion  and >50 ug/mL at 12 hours after ingestion are often associated with  toxic reactions.  Performed at [facility], [place] [address] [place], [place] [address]    WBC 05/17/2022 7.2  4.0 - 10.5 K/uL Final   RBC 05/17/2022 4.33  4.22 - 5.81 MIL/uL Final   Hemoglobin 05/17/2022 14.9  13.0 - 17.0 g/dL Final   HCT 05/17/2022 44.2  39.0 - 52.0 % Final   MCV 05/17/2022 102.1 (H)  80.0 - 100.0 fL Final   MCH 05/17/2022 34.4 (H)  26.0 - 34.0 pg Final   MCHC 05/17/2022 33.7  30.0 - 36.0 g/dL Final   RDW 05/17/2022 12.4  11.5 - 15.5 % Final   Platelets 05/17/2022 191  150 - 400 K/uL Final   nRBC 05/17/2022 0.0  0.0 - 0.2 % Final   Performed at [facility], [place] [address], [place], [place] [address]   Opiates 05/17/2022 NONE DETECTED  NONE DETECTED Final   Cocaine 05/17/2022 POSITIVE (A)  NONE DETECTED Final   Benzodiazepines 05/17/2022 NONE DETECTED  NONE DETECTED Final   Amphetamines 05/17/2022 NONE DETECTED  NONE DETECTED Final   Tetrahydrocannabinol 05/17/2022 POSITIVE (A)  NONE DETECTED Final   Barbiturates 05/17/2022 NONE DETECTED   NONE DETECTED Final   Comment: (NOTE) DRUG SCREEN FOR MEDICAL PURPOSES ONLY.  IF CONFIRMATION IS NEEDED FOR ANY PURPOSE, NOTIFY LAB WITHIN 5 DAYS.  LOWEST DETECTABLE LIMITS FOR URINE DRUG SCREEN Drug Class                     Cutoff (ng/mL) Amphetamine and metabolites    1000 Barbiturate and metabolites    200 Benzodiazepine                 200 Opiates and metabolites        300 Cocaine and metabolites        300 THC                            50 Performed at [facility], [place] [address] [place], [place] [address]     Allergies: Tylenol [acetaminophen]  Medications:  Facility Ordered Medications  Medication   alum & mag  hydroxide-simeth (MAALOX/MYLANTA) 200-200-20 MG/5ML suspension 30 mL   magnesium hydroxide (MILK OF MAGNESIA) suspension 30 mL   OLANZapine zydis (ZYPREXA) disintegrating tablet 10 mg   And   LORazepam (ATIVAN) tablet 1 mg   And   ziprasidone (GEODON) injection 20 mg   dicyclomine (BENTYL) tablet 20 mg   hydrOXYzine (ATARAX) tablet 25 mg   loperamide (IMODIUM) capsule 2-4 mg   methocarbamol (ROBAXIN) tablet 500 mg   naproxen (NAPROSYN) tablet 500 mg   ondansetron (ZOFRAN-ODT) disintegrating tablet 4 mg   cloNIDine (CATAPRES) tablet 0.1 mg   Followed by   Derrill Memo ON 09/21/2022] cloNIDine (CATAPRES) tablet 0.1 mg   Followed by   Derrill Memo ON 09/24/2022] cloNIDine (CATAPRES) tablet 0.1 mg   PTA Medications  Medication Sig   albuterol (VENTOLIN HFA) 108 (90 Base) MCG/ACT inhaler Inhale 1-2 puffs into the lungs every 6 (six) hours as needed for wheezing or shortness of breath. (Patient taking differently: Inhale 2 puffs into the lungs every 6 (six) hours as needed for wheezing or shortness of breath.)   Fluticasone Propionate HFA (FLOVENT HFA IN) Inhale 2 puffs into the lungs 2 (two) times daily as needed (For shortness of breath.).    Long Term Goals: Improvement in symptoms so as ready for discharge  Short Term Goals: Patient will  verbalize feelings in meetings with treatment team members., Patient will attend at least of 50% of the groups daily., Pt will complete the PHQ9 on admission, day 3 and discharge., Patient will participate in completing the [place], Patient will score a low risk of violence for 24 hours prior to discharge, and Patient will take medications as prescribed daily.  Medical Decision Making  Inpatient FBC   Lab Orders         Resp panel by RT-PCR (RSV, Flu A&B, Covid) Anterior Nasal Swab         CBC with Differential/Platelet         Comprehensive metabolic panel         Hemoglobin A1c         Lipid panel         TSH         POCT Urine Drug Screen - (I-Screen)         POC SARS Coronavirus 2 Ag      Meds ordered this encounter  Medications   alum & mag hydroxide-simeth (MAALOX/MYLANTA) 200-200-20 MG/5ML suspension 30 mL   magnesium hydroxide (MILK OF MAGNESIA) suspension 30 mL   AND Linked Order Group    OLANZapine zydis (ZYPREXA) disintegrating tablet 10 mg    LORazepam (ATIVAN) tablet 1 mg    ziprasidone (GEODON) injection 20 mg   dicyclomine (BENTYL) tablet 20 mg   hydrOXYzine (ATARAX) tablet 25 mg   loperamide (IMODIUM) capsule 2-4 mg   methocarbamol (ROBAXIN) tablet 500 mg   naproxen (NAPROSYN) tablet 500 mg   ondansetron (ZOFRAN-ODT) disintegrating tablet 4 mg   FOLLOWED BY Linked Order Group    cloNIDine (CATAPRES) tablet 0.1 mg    cloNIDine (CATAPRES) tablet 0.1 mg    cloNIDine (CATAPRES) tablet 0.1 mg     Recommendations  Based on my evaluation the patient appears to have an emergency medical condition for which I recommend the patient be transferred to the emergency department for further evaluation.  Evette Georges, NP 09/19/22  5:44 AM

## 2022-09-18 NOTE — ED Triage Notes (Signed)
Pt presents to [facility] voluntarily, unaccompanied at this time requesting substance abuse treatment. Pt reports using cocaine, mushrooms and alcohol daily and has used both within the last 24 hours. Pt denies SI, HI, AVH.

## 2022-09-18 NOTE — ED Notes (Signed)
Pt is sleeping. No distress noted. Will continue to monitor for  safety.

## 2022-09-19 DIAGNOSIS — F1410 Cocaine abuse, uncomplicated: Secondary | ICD-10-CM | POA: Diagnosis not present

## 2022-09-19 LAB — RESP PANEL BY RT-PCR (RSV, FLU A&B, COVID)  RVPGX2
Influenza A by PCR: NEGATIVE
Influenza B by PCR: NEGATIVE
Resp Syncytial Virus by PCR: NEGATIVE
SARS Coronavirus 2 by RT PCR: NEGATIVE

## 2022-09-19 LAB — CBC WITH DIFFERENTIAL/PLATELET
Abs Immature Granulocytes: 0.01 10*3/uL (ref 0.00–0.07)
Basophils Absolute: 0 10*3/uL (ref 0.0–0.1)
Basophils Relative: 0 %
Eosinophils Absolute: 0.1 10*3/uL (ref 0.0–0.5)
Eosinophils Relative: 1 %
HCT: 44.6 % (ref 39.0–52.0)
Hemoglobin: 15.4 g/dL (ref 13.0–17.0)
Immature Granulocytes: 0 %
Lymphocytes Relative: 39 %
Lymphs Abs: 2.6 10*3/uL (ref 0.7–4.0)
MCH: 34.8 pg — ABNORMAL HIGH (ref 26.0–34.0)
MCHC: 34.5 g/dL (ref 30.0–36.0)
MCV: 100.9 fL — ABNORMAL HIGH (ref 80.0–100.0)
Monocytes Absolute: 0.5 10*3/uL (ref 0.1–1.0)
Monocytes Relative: 7 %
Neutro Abs: 3.5 10*3/uL (ref 1.7–7.7)
Neutrophils Relative %: 53 %
Platelets: 219 10*3/uL (ref 150–400)
RBC: 4.42 MIL/uL (ref 4.22–5.81)
RDW: 11.9 % (ref 11.5–15.5)
WBC: 6.7 10*3/uL (ref 4.0–10.5)
nRBC: 0 % (ref 0.0–0.2)

## 2022-09-19 LAB — LIPID PANEL
Cholesterol: 135 mg/dL (ref 0–200)
HDL: 72 mg/dL (ref >40)
LDL Cholesterol: 49 mg/dL (ref 0–99)
Total CHOL/HDL Ratio: 1.9 RATIO
Triglycerides: 72 mg/dL (ref <150)
VLDL: 14 mg/dL (ref 0–40)

## 2022-09-19 LAB — COMPREHENSIVE METABOLIC PANEL
ALT: 23 U/L (ref 0–44)
AST: 34 U/L (ref 15–41)
Albumin: 4.4 g/dL (ref 3.5–5.0)
Alkaline Phosphatase: 67 U/L (ref 38–126)
Anion gap: 13 (ref 5–15)
BUN: 9 mg/dL (ref 6–20)
CO2: 27 mmol/L (ref 22–32)
Calcium: 9.4 mg/dL (ref 8.9–10.3)
Chloride: 99 mmol/L (ref 98–111)
Creatinine, Ser: 0.97 mg/dL (ref 0.61–1.24)
GFR, Estimated: >60 mL/min (ref >60)
Glucose, Bld: 62 mg/dL — ABNORMAL LOW (ref 70–99)
Potassium: 4 mmol/L (ref 3.5–5.1)
Sodium: 139 mmol/L (ref 135–145)
Total Bilirubin: 0.6 mg/dL (ref 0.3–1.2)
Total Protein: 8.2 g/dL — ABNORMAL HIGH (ref 6.5–8.1)

## 2022-09-19 LAB — TSH: TSH: 1.267 u[IU]/mL (ref 0.350–4.500)

## 2022-09-19 MED ORDER — LORAZEPAM 1 MG PO TABS: 1.0000 mg | ORAL_TABLET | Freq: Four times a day (QID) | ORAL | Status: DC | PRN

## 2022-09-19 NOTE — ED Notes (Signed)
Pt is sleeping. No distress noted. Will continue to monitor for safety.

## 2022-09-19 NOTE — ED Notes (Signed)
Patient is asleep at this time.  No somatic complaint or distress.  Denies withdrawal symptoms.  Encouraged to seek staff to have needs met.  Will monitor.
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
## 2022-09-19 NOTE — ED Notes (Signed)
Notified pt that it's time for breakfast x2
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
## 2022-09-19 NOTE — ED Notes (Signed)
Notified pt that breakfast is served

## 2022-09-19 NOTE — ED Provider Notes (Signed)
Behavioral Health Progress Note  Date and Time: 09/19/2022 4:31 PM Name: Calvin [name] MRN:  [id number]  Subjective:   The patient is a 37 year old male with a history of alcohol use disorder, cocaine use disorder, and cannabis use disorder.  Previously admitted to the facility based crisis in April of last year.  He left prematurely before going to [facility] residential.  No behavioral health admissions for self-harm or depression.  Denies substantial depressive symptoms currently.  He reports being in the juvenile detention center for 7 years for armed robbery.  He states that he is currently living with his aunt who is a Risk analyst.  He states that he has a girlfriend who he plans to move in with sometime soon.  He states that he has been using a bevy of substances: Multiple pints of alcohol 5 days/week (no history of withdrawal symptoms), occasionally using psilocybin mushrooms and Percocet, cocaine several times per week, and marijuana "20 times per day".  He is currently admitted to the facility based crisis, and he is requesting residential rehab placement.    The patient states that his motivating factor for quitting is to help his children.  The patient denies auditory/visual hallucinations.  The patient reports good mood, appetite, and sleep. They deny suicidal and homicidal thoughts. The patient denies side effects from their medications.  Review of systems as below. The patient denies experiencing any withdrawal symptoms.    Diagnosis:  Final diagnoses:  Alcohol abuse  Cocaine abuse ([place])  Polysubstance abuse ([facility])  Anxious appearance    Total Time spent with patient: 20 minutes  Past Psychiatric History: as above Past Medical History: as above Family History: none Family Psychiatric  History: none Social History: as above and per H and P  Additional Social History:  See H and P                  Sleep: Fair  Appetite:  Fair   Additional Social History:     Pain Medications: Oxycodone off the street.  . Prescriptions: None Over the Counter: NOne History of alcohol / drug use?: Yes Longest period of sobriety (when/how long): Seven year (while he was in prison). Negative Consequences of Use: Legal, Personal relationships Withdrawal Symptoms: None Name of Substance 1: Cocaine (powder) 1 - Age of First Use: 37 years of age 31 - Amount (size/oz): 3-4 grams 1 - Frequency: DAily 1 - Duration: ongoing 1 - Last Use / Amount: 02/29 1 - Method of Aquiring: illegal purchase 1- Route of Use: snorting Name of Substance 2: Oxycodone 2 - Age of First Use: 2012. 2 - Amount (size/oz): Pt reports, taking 10 mg Oxycodone May use '5mg'$ . 2 - Frequency: ONe in a month 2 - Duration: Off and on 2 - Last Use / Amount: 09/17/22 2 - Method of Aquiring: off the street 2 - Route of Substance Use: pills, oral Name of Substance 3: Alcohol. 3 - Age of First Use: 2011. 3 - Amount (size/oz): Pt says "a pint or two (or three). 3 - Frequency: DAily 3 - Duration: Last three days at that rate. 3 - Last Use / Amount: 09/18/22 3 - Method of Aquiring: Purchase 3 - Route of Substance Use: oral Name of Substance 4: Marijuana. 4 - Age of First Use: Since he was 37 years old. 4 - Amount (size/oz): Pt reports, smoking 8 grams of Marijuana daily. 4 - Frequency: Daily. 4 - Duration: Ongoing. 4 - Last Use / Amount: 02/29 4 -  Method of Aquiring: illegal purchase 4 - Route of Substance Use: s,plomg Name of Substance 5: Mushrooms (eating) 5 - Age of First Use: [place] 5 - Amount (size/oz): "little tiny bit" 5 - Frequency: Twice in a week 5 - Duration: off and on 5 - Last Use / Amount: 02/29 5 - Method of Aquiring: illegal purchase 5 - Route of Substance Use: edible          Sleep: Fair  Appetite:  Fair  Current Medications:  Current Facility-Administered Medications  Medication Dose Route Frequency Provider Last Rate Last Admin   alum & mag hydroxide-simeth  (MAALOX/MYLANTA) 200-200-20 MG/5ML suspension 30 mL  30 mL Oral Q4H PRN Evette Georges, NP       cloNIDine (CATAPRES) tablet 0.1 mg  0.1 mg Oral QID Evette Georges, NP   0.1 mg at 09/19/22 1406   Followed by   Derrill Memo ON 09/21/2022] cloNIDine (CATAPRES) tablet 0.1 mg  0.1 mg Oral Ronalee Belts, NP       Followed by   Derrill Memo ON 09/24/2022] cloNIDine (CATAPRES) tablet 0.1 mg  0.1 mg Oral QAC breakfast Evette Georges, NP       dicyclomine (BENTYL) tablet 20 mg  20 mg Oral Q6H PRN Evette Georges, NP       hydrOXYzine (ATARAX) tablet 25 mg  25 mg Oral Q6H PRN Evette Georges, NP       loperamide (IMODIUM) capsule 2-4 mg  2-4 mg Oral PRN Evette Georges, NP       OLANZapine zydis (ZYPREXA) disintegrating tablet 10 mg  10 mg Oral Q8H PRN Evette Georges, NP       And   LORazepam (ATIVAN) tablet 1 mg  1 mg Oral PRN Evette Georges, NP       And   ziprasidone (GEODON) injection 20 mg  20 mg Intramuscular PRN Evette Georges, NP       magnesium hydroxide (MILK OF MAGNESIA) suspension 30 mL  30 mL Oral Daily PRN Evette Georges, NP       methocarbamol (ROBAXIN) tablet 500 mg  500 mg Oral Q8H PRN Evette Georges, NP       naproxen (NAPROSYN) tablet 500 mg  500 mg Oral BID PRN Evette Georges, NP       ondansetron (ZOFRAN-ODT) disintegrating tablet 4 mg  4 mg Oral Q6H PRN Evette Georges, NP       Current Outpatient Medications  Medication Sig Dispense Refill   albuterol (VENTOLIN HFA) 108 (90 Base) MCG/ACT inhaler Inhale 1-2 puffs into the lungs every 6 (six) hours as needed for wheezing or shortness of breath. (Patient taking differently: Inhale 2 puffs into the lungs every 6 (six) hours as needed for wheezing or shortness of breath.) 1 each 0   Fluticasone Propionate HFA (FLOVENT HFA IN) Inhale 2 puffs into the lungs 2 (two) times daily as needed (For shortness of breath.).      Labs  Lab Results:  Admission on 09/18/2022  Component Date Value Ref Range Status   SARS Coronavirus 2 by RT PCR 09/18/2022 NEGATIVE   NEGATIVE Final   Influenza A by PCR 09/18/2022 NEGATIVE  NEGATIVE Final   Influenza B by PCR 09/18/2022 NEGATIVE  NEGATIVE Final   Comment: (NOTE) The Xpert Xpress SARS-CoV-2/FLU/RSV plus assay is intended as an aid in the diagnosis of influenza from Nasopharyngeal swab specimens and should not be used as a sole basis for treatment. Nasal washings and aspirates are unacceptable for Xpert Xpress SARS-CoV-2/FLU/RSV testing.  Fact Sheet for  Patients: EntrepreneurPulse.com.au  Fact Sheet for Healthcare Providers: IncredibleEmployment.be  This test is not yet approved or cleared by the [place] FDA and has been authorized for detection and/or diagnosis of SARS-CoV-2 by FDA under an Emergency Use Authorization (EUA). This EUA will remain in effect (meaning this test can be used) for the duration of the COVID-19 declaration under Section 564(b)(1) of the Act, 21 U.S.C. section 360bbb-3(b)(1), unless the authorization is terminated or revoked.     Resp Syncytial Virus by PCR 09/18/2022 NEGATIVE  NEGATIVE Final   Comment: (NOTE) Fact Sheet for Patients: EntrepreneurPulse.com.au  Fact Sheet for Healthcare Providers: IncredibleEmployment.be  This test is not yet approved or cleared by the [place] FDA and has been authorized for detection and/or diagnosis of SARS-CoV-2 by FDA under an Emergency Use Authorization (EUA). This EUA will remain in effect (meaning this test can be used) for the duration of the COVID-19 declaration under Section 564(b)(1) of the Act, 21 U.S.C. section 360bbb-3(b)(1), unless the authorization is terminated or revoked.  Performed at [facility], [place] [address] [place], [place] [address]    WBC 09/18/2022 6.7  4.0 - 10.5 K/uL Final   RBC 09/18/2022 4.42  4.22 - 5.81 MIL/uL Final   Hemoglobin 09/18/2022 15.4  13.0 - 17.0 g/dL Final   HCT 09/18/2022 44.6  39.0 - 52.0 %  Final   MCV 09/18/2022 100.9 (H)  80.0 - 100.0 fL Final   MCH 09/18/2022 34.8 (H)  26.0 - 34.0 pg Final   MCHC 09/18/2022 34.5  30.0 - 36.0 g/dL Final   RDW 09/18/2022 11.9  11.5 - 15.5 % Final   Platelets 09/18/2022 219  150 - 400 K/uL Final   nRBC 09/18/2022 0.0  0.0 - 0.2 % Final   Neutrophils Relative % 09/18/2022 53  % Final   Neutro Abs 09/18/2022 3.5  1.7 - 7.7 K/uL Final   Lymphocytes Relative 09/18/2022 39  % Final   Lymphs Abs 09/18/2022 2.6  0.7 - 4.0 K/uL Final   Monocytes Relative 09/18/2022 7  % Final   Monocytes Absolute 09/18/2022 0.5  0.1 - 1.0 K/uL Final   Eosinophils Relative 09/18/2022 1  % Final   Eosinophils Absolute 09/18/2022 0.1  0.0 - 0.5 K/uL Final   Basophils Relative 09/18/2022 0  % Final   Basophils Absolute 09/18/2022 0.0  0.0 - 0.1 K/uL Final   Immature Granulocytes 09/18/2022 0  % Final   Abs Immature Granulocytes 09/18/2022 0.01  0.00 - 0.07 K/uL Final   Performed at [facility], [place] [address] [place], [place] [address]   Sodium 09/18/2022 139  135 - 145 mmol/L Final   Potassium 09/18/2022 4.0  3.5 - 5.1 mmol/L Final   Chloride 09/18/2022 99  98 - 111 mmol/L Final   CO2 09/18/2022 27  22 - 32 mmol/L Final   Glucose, Bld 09/18/2022 62 (L)  70 - 99 mg/dL Final   Glucose reference range applies only to samples taken after fasting for at least 8 hours.   BUN 09/18/2022 9  6 - 20 mg/dL Final   Creatinine, Ser 09/18/2022 0.97  0.61 - 1.24 mg/dL Final   Calcium 09/18/2022 9.4  8.9 - 10.3 mg/dL Final   Total Protein 09/18/2022 8.2 (H)  6.5 - 8.1 g/dL Final   Albumin 09/18/2022 4.4  3.5 - 5.0 g/dL Final   AST 09/18/2022 34  15 - 41 U/L Final   ALT 09/18/2022 23  0 - 44 U/L Final  Alkaline Phosphatase 09/18/2022 67  38 - 126 U/L Final   Total Bilirubin 09/18/2022 0.6  0.3 - 1.2 mg/dL Final   GFR, Estimated 09/18/2022 >60  >60 mL/min Final   Comment: (NOTE) Calculated using the CKD-EPI Creatinine Equation (2021)    Anion gap 09/18/2022 13  5 -  15 Final   Performed at [facility] [address] [place], [place] [address]   Cholesterol 09/18/2022 135  0 - 200 mg/dL Final   Triglycerides 09/18/2022 72  <150 mg/dL Final   HDL 09/18/2022 72  >40 mg/dL Final   Total CHOL/HDL Ratio 09/18/2022 1.9  RATIO Final   VLDL 09/18/2022 14  0 - 40 mg/dL Final   LDL Cholesterol 09/18/2022 49  0 - 99 mg/dL Final   Comment:        Total Cholesterol/HDL:CHD Risk Coronary Heart Disease Risk Table                     Men   Women  1/2 Average Risk   3.4   3.3  Average Risk       5.0   4.4  2 X Average Risk   9.6   7.1  3 X Average Risk  23.4   11.0        Use the calculated Patient Ratio above and the CHD Risk Table to determine the patient's CHD Risk.        ATP III CLASSIFICATION (LDL):  <100     mg/dL   Optimal  100-129  mg/dL   Near or Above                    Optimal  130-159  mg/dL   Borderline  160-189  mg/dL   High  >190     mg/dL   Very High Performed at [facility] [address], [place], [place] [address]    TSH 09/18/2022 1.267  0.350 - 4.500 uIU/mL Final   Comment: Performed by a 3rd Generation assay with a functional sensitivity of <=0.01 uIU/mL. Performed at [facility], [place] [address], [place], [place] [address]    POC Amphetamine UR 09/18/2022 None Detected  NONE DETECTED (Cut Off Level 1000 ng/mL) Preliminary   POC Secobarbital (BAR) 09/18/2022 None Detected  NONE DETECTED (Cut Off Level 300 ng/mL) Preliminary   POC Buprenorphine (BUP) 09/18/2022 None Detected  NONE DETECTED (Cut Off Level 10 ng/mL) Preliminary   POC Oxazepam (BZO) 09/18/2022 None Detected  NONE DETECTED (Cut Off Level 300 ng/mL) Preliminary   POC Cocaine UR 09/18/2022 None Detected  NONE DETECTED (Cut Off Level 300 ng/mL) Preliminary   POC Methamphetamine UR 09/18/2022 Positive (A)  NONE DETECTED (Cut Off Level 1000 ng/mL) Preliminary   POC Morphine 09/18/2022 None Detected  NONE DETECTED (Cut Off Level 300 ng/mL) Preliminary    POC Methadone UR 09/18/2022 None Detected  NONE DETECTED (Cut Off Level 300 ng/mL) Preliminary   POC Oxycodone UR 09/18/2022 Positive (A)  NONE DETECTED (Cut Off Level 100 ng/mL) Preliminary   POC Marijuana UR 09/18/2022 Positive (A)  NONE DETECTED (Cut Off Level 50 ng/mL) Preliminary   SARSCOV2ONAVIRUS 2 AG 09/18/2022 NEGATIVE  NEGATIVE Final   Comment: (NOTE) SARS-CoV-2 antigen NOT DETECTED.   Negative results are presumptive.  Negative results do not preclude SARS-CoV-2 infection and should not be used as the sole basis for treatment or other patient management decisions, including infection  control decisions, particularly in the presence  of clinical signs and  symptoms consistent with COVID-19, or in those who have been in contact with the virus.  Negative results must be combined with clinical observations, patient history, and epidemiological information. The expected result is Negative.  Fact Sheet for Patients: HandmadeRecipes.com.cy  Fact Sheet for Healthcare Providers: FuneralLife.at  This test is not yet approved or cleared by the [place] FDA and  has been authorized for detection and/or diagnosis of SARS-CoV-2 by FDA under an Emergency Use Authorization (EUA).  This EUA will remain in effect (meaning this test can be used) for the duration of  the COV                          ID-19 declaration under Section 564(b)(1) of the Act, 21 U.S.C. section 360bbb-3(b)(1), unless the authorization is terminated or revoked sooner.    Admission on 08/05/2022, Discharged on 08/06/2022  Component Date Value Ref Range Status   Lipase 08/05/2022 49  11 - 51 U/L Final   Performed at [facility] [address], [place], [place] [address]   Sodium 08/05/2022 136  135 - 145 mmol/L Final   Potassium 08/05/2022 3.9  3.5 - 5.1 mmol/L Final   Chloride 08/05/2022 102  98 - 111 mmol/L Final   CO2 08/05/2022 26  22 - 32 mmol/L Final    Glucose, Bld 08/05/2022 145 (H)  70 - 99 mg/dL Final   Glucose reference range applies only to samples taken after fasting for at least 8 hours.   BUN 08/05/2022 8  6 - 20 mg/dL Final   Creatinine, Ser 08/05/2022 1.01  0.61 - 1.24 mg/dL Final   Calcium 08/05/2022 9.0  8.9 - 10.3 mg/dL Final   Total Protein 08/05/2022 7.1  6.5 - 8.1 g/dL Final   Albumin 08/05/2022 3.9  3.5 - 5.0 g/dL Final   AST 08/05/2022 27  15 - 41 U/L Final   ALT 08/05/2022 15  0 - 44 U/L Final   Alkaline Phosphatase 08/05/2022 64  38 - 126 U/L Final   Total Bilirubin 08/05/2022 0.5  0.3 - 1.2 mg/dL Final   GFR, Estimated 08/05/2022 >60  >60 mL/min Final   Comment: (NOTE) Calculated using the CKD-EPI Creatinine Equation (2021)    Anion gap 08/05/2022 8  5 - 15 Final   Performed at [facility], [place] [address], [place], [place] [address]   WBC 08/05/2022 6.7  4.0 - 10.5 K/uL Final   RBC 08/05/2022 4.25  4.22 - 5.81 MIL/uL Final   Hemoglobin 08/05/2022 14.7  13.0 - 17.0 g/dL Final   HCT 08/05/2022 44.2  39.0 - 52.0 % Final   MCV 08/05/2022 104.0 (H)  80.0 - 100.0 fL Final   MCH 08/05/2022 34.6 (H)  26.0 - 34.0 pg Final   MCHC 08/05/2022 33.3  30.0 - 36.0 g/dL Final   RDW 08/05/2022 11.9  11.5 - 15.5 % Final   Platelets 08/05/2022 209  150 - 400 K/uL Final   nRBC 08/05/2022 0.0  0.0 - 0.2 % Final   Performed at [facility] [address] [place], [place] [address]   Color, Urine 08/05/2022 YELLOW  YELLOW Final   APPearance 08/05/2022 CLEAR  CLEAR Final   Specific Gravity, Urine 08/05/2022 1.025  1.005 - 1.030 Final   pH 08/05/2022 6.0  5.0 - 8.0 Final   Glucose, UA 08/05/2022 50 (A)  NEGATIVE mg/dL Final   Hgb urine dipstick  08/05/2022 SMALL (A)  NEGATIVE Final   Bilirubin Urine 08/05/2022 NEGATIVE  NEGATIVE Final   Ketones, ur 08/05/2022 NEGATIVE  NEGATIVE mg/dL Final   Protein, ur 08/05/2022 30 (A)  NEGATIVE mg/dL Final   Nitrite 08/05/2022 NEGATIVE  NEGATIVE Final   Leukocytes,Ua 08/05/2022  SMALL (A)  NEGATIVE Final   RBC / HPF 08/05/2022 6-10  0 - 5 RBC/hpf Final   WBC, UA 08/05/2022 11-20  0 - 5 WBC/hpf Final   Bacteria, UA 08/05/2022 RARE (A)  NONE SEEN Final   Squamous Epithelial / HPF 08/05/2022 0-5  0 - 5 /HPF Final   Mucus 08/05/2022 PRESENT   Final   Hyaline Casts, UA 08/05/2022 PRESENT   Final   Performed at [facility], [place] [address], [place], [place] [address]  Admission on 07/05/2022, Discharged on 07/06/2022  Component Date Value Ref Range Status   SARS Coronavirus 2 by RT PCR 07/05/2022 NEGATIVE  NEGATIVE Final   Comment: (NOTE) SARS-CoV-2 target nucleic acids are NOT DETECTED.  The SARS-CoV-2 RNA is generally detectable in upper respiratory specimens during the acute phase of infection. The lowest concentration of SARS-CoV-2 viral copies this assay can detect is 138 copies/mL. A negative result does not preclude SARS-Cov-2 infection and should not be used as the sole basis for treatment or other patient management decisions. A negative result may occur with  improper specimen collection/handling, submission of specimen other than nasopharyngeal swab, presence of viral mutation(s) within the areas targeted by this assay, and inadequate number of viral copies(<138 copies/mL). A negative result must be combined with clinical observations, patient history, and epidemiological information. The expected result is Negative.  Fact Sheet for Patients:  EntrepreneurPulse.com.au  Fact Sheet for Healthcare Providers:  IncredibleEmployment.be  This test is no                          t yet approved or cleared by the [place] FDA and  has been authorized for detection and/or diagnosis of SARS-CoV-2 by FDA under an Emergency Use Authorization (EUA). This EUA will remain  in effect (meaning this test can be used) for the duration of the COVID-19 declaration under Section 564(b)(1) of the Act, 21 U.S.C.section  360bbb-3(b)(1), unless the authorization is terminated  or revoked sooner.       Influenza A by PCR 07/05/2022 NEGATIVE  NEGATIVE Final   Influenza B by PCR 07/05/2022 NEGATIVE  NEGATIVE Final   Comment: (NOTE) The Xpert Xpress SARS-CoV-2/FLU/RSV plus assay is intended as an aid in the diagnosis of influenza from Nasopharyngeal swab specimens and should not be used as a sole basis for treatment. Nasal washings and aspirates are unacceptable for Xpert Xpress SARS-CoV-2/FLU/RSV testing.  Fact Sheet for Patients: EntrepreneurPulse.com.au  Fact Sheet for Healthcare Providers: IncredibleEmployment.be  This test is not yet approved or cleared by the [place] FDA and has been authorized for detection and/or diagnosis of SARS-CoV-2 by FDA under an Emergency Use Authorization (EUA). This EUA will remain in effect (meaning this test can be used) for the duration of the COVID-19 declaration under Section 564(b)(1) of the Act, 21 U.S.C. section 360bbb-3(b)(1), unless the authorization is terminated or revoked.     Resp Syncytial Virus by PCR 07/05/2022 NEGATIVE  NEGATIVE Final   Comment: (NOTE) Fact Sheet for Patients: EntrepreneurPulse.com.au  Fact Sheet for Healthcare Providers: IncredibleEmployment.be  This test is not yet approved or cleared by the [place] FDA and has been authorized for detection  and/or diagnosis of SARS-CoV-2 by FDA under an Emergency Use Authorization (EUA). This EUA will remain in effect (meaning this test can be used) for the duration of the COVID-19 declaration under Section 564(b)(1) of the Act, 21 U.S.C. section 360bbb-3(b)(1), unless the authorization is terminated or revoked.  Performed at [facility], [place] [address], [place], [place] [address]    WBC 07/05/2022 6.9  4.0 - 10.5 K/uL Final   RBC 07/05/2022 3.75 (L)  4.22 - 5.81 MIL/uL Final   Hemoglobin  07/05/2022 13.3  13.0 - 17.0 g/dL Final   HCT 07/05/2022 37.9 (L)  39.0 - 52.0 % Final   MCV 07/05/2022 101.1 (H)  80.0 - 100.0 fL Final   MCH 07/05/2022 35.5 (H)  26.0 - 34.0 pg Final   MCHC 07/05/2022 35.1  30.0 - 36.0 g/dL Final   RDW 07/05/2022 12.4  11.5 - 15.5 % Final   Platelets 07/05/2022 199  150 - 400 K/uL Final   nRBC 07/05/2022 0.0  0.0 - 0.2 % Final   Neutrophils Relative % 07/05/2022 47  % Final   Neutro Abs 07/05/2022 3.2  1.7 - 7.7 K/uL Final   Lymphocytes Relative 07/05/2022 42  % Final   Lymphs Abs 07/05/2022 2.9  0.7 - 4.0 K/uL Final   Monocytes Relative 07/05/2022 9  % Final   Monocytes Absolute 07/05/2022 0.7  0.1 - 1.0 K/uL Final   Eosinophils Relative 07/05/2022 1  % Final   Eosinophils Absolute 07/05/2022 0.1  0.0 - 0.5 K/uL Final   Basophils Relative 07/05/2022 1  % Final   Basophils Absolute 07/05/2022 0.1  0.0 - 0.1 K/uL Final   Immature Granulocytes 07/05/2022 0  % Final   Abs Immature Granulocytes 07/05/2022 0.01  0.00 - 0.07 K/uL Final   Performed at [facility], [place] [address] [place], [place] [address]   Sodium 07/05/2022 140  135 - 145 mmol/L Final   Potassium 07/05/2022 3.7  3.5 - 5.1 mmol/L Final   Chloride 07/05/2022 102  98 - 111 mmol/L Final   CO2 07/05/2022 27  22 - 32 mmol/L Final   Glucose, Bld 07/05/2022 97  70 - 99 mg/dL Final   Glucose reference range applies only to samples taken after fasting for at least 8 hours.   BUN 07/05/2022 9  6 - 20 mg/dL Final   Creatinine, Ser 07/05/2022 1.13  0.61 - 1.24 mg/dL Final   Calcium 07/05/2022 9.3  8.9 - 10.3 mg/dL Final   Total Protein 07/05/2022 7.0  6.5 - 8.1 g/dL Final   Albumin 07/05/2022 4.1  3.5 - 5.0 g/dL Final   AST 07/05/2022 36  15 - 41 U/L Final   ALT 07/05/2022 22  0 - 44 U/L Final   Alkaline Phosphatase 07/05/2022 57  38 - 126 U/L Final   Total Bilirubin 07/05/2022 0.4  0.3 - 1.2 mg/dL Final   GFR, Estimated 07/05/2022 >60  >60 mL/min Final   Comment: (NOTE) Calculated using  the CKD-EPI Creatinine Equation (2021)    Anion gap 07/05/2022 11  5 - 15 Final   Performed at [facility] [address], [place], [place] [address]   Hgb A1c MFr Bld 07/05/2022 4.9  4.8 - 5.6 % Final   Comment: (NOTE)         Prediabetes: 5.7 - 6.4         Diabetes: >6.4         Glycemic control for adults with diabetes: <  7.0    Mean Plasma Glucose 07/05/2022 94  mg/dL Final   Comment: (NOTE) Performed At: [facility] [place], [place] [id number] Rush Farmer MD [id number]    Alcohol, Ethyl (B) 07/05/2022 <10  <10 mg/dL Final   Comment: (NOTE) Lowest detectable limit for serum alcohol is 10 mg/dL.  For medical purposes only. Performed at [facility], [place] [address], [place], [place] [address]    Cholesterol 07/05/2022 111  0 - 200 mg/dL Final   Triglycerides 07/05/2022 38  <150 mg/dL Final   HDL 07/05/2022 71  >40 mg/dL Final   Total CHOL/HDL Ratio 07/05/2022 1.6  RATIO Final   VLDL 07/05/2022 8  0 - 40 mg/dL Final   LDL Cholesterol 07/05/2022 32  0 - 99 mg/dL Final   Comment:        Total Cholesterol/HDL:CHD Risk Coronary Heart Disease Risk Table                     Men   Women  1/2 Average Risk   3.4   3.3  Average Risk       5.0   4.4  2 X Average Risk   9.6   7.1  3 X Average Risk  23.4   11.0        Use the calculated Patient Ratio above and the CHD Risk Table to determine the patient's CHD Risk.        ATP III CLASSIFICATION (LDL):  <100     mg/dL   Optimal  100-129  mg/dL   Near or Above                    Optimal  130-159  mg/dL   Borderline  160-189  mg/dL   High  >190     mg/dL   Very High Performed at [facility] [address], [place], [place] [address]    TSH 07/05/2022 0.914  0.350 - 4.500 uIU/mL Final   Comment: Performed by a 3rd Generation assay with a functional sensitivity of <=0.01 uIU/mL. Performed at [facility], [place] [address], [place], [place] [address]    SARSCOV2ONAVIRUS 2 AG  07/05/2022 NEGATIVE  NEGATIVE Final   Comment: (NOTE) SARS-CoV-2 antigen NOT DETECTED.   Negative results are presumptive.  Negative results do not preclude SARS-CoV-2 infection and should not be used as the sole basis for treatment or other patient management decisions, including infection  control decisions, particularly in the presence of clinical signs and  symptoms consistent with COVID-19, or in those who have been in contact with the virus.  Negative results must be combined with clinical observations, patient history, and epidemiological information. The expected result is Negative.  Fact Sheet for Patients: HandmadeRecipes.com.cy  Fact Sheet for Healthcare Providers: FuneralLife.at  This test is not yet approved or cleared by the [place] FDA and  has been authorized for detection and/or diagnosis of SARS-CoV-2 by FDA under an Emergency Use Authorization (EUA).  This EUA will remain in effect (meaning this test can be used) for the duration of  the COV                          ID-19 declaration under Section 564(b)(1) of the Act, 21 U.S.C. section 360bbb-3(b)(1), unless the authorization is terminated or revoked sooner.    Admission on 05/16/2022, Discharged on 05/17/2022  Component Date Value Ref Range  Status   Sodium 05/17/2022 135  135 - 145 mmol/L Final   Potassium 05/17/2022 3.7  3.5 - 5.1 mmol/L Final   Chloride 05/17/2022 102  98 - 111 mmol/L Final   CO2 05/17/2022 25  22 - 32 mmol/L Final   Glucose, Bld 05/17/2022 152 (H)  70 - 99 mg/dL Final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

## 2022-09-19 NOTE — ED Notes (Signed)
Pt is sleeping. No distress noted. Will continue to monitor  for safety.

## 2022-09-20 DIAGNOSIS — F1410 Cocaine abuse, uncomplicated: Secondary | ICD-10-CM | POA: Diagnosis not present

## 2022-09-20 LAB — HEMOGLOBIN A1C
Hgb A1c MFr Bld: 4.9 % (ref 4.8–5.6)
Mean Plasma Glucose: 94 mg/dL

## 2022-09-20 MED ORDER — THIAMINE HCL 100 MG PO TABS: 100.0000 mg | ORAL_TABLET | Freq: Every day | ORAL | Status: DC

## 2022-09-20 MED ORDER — ADULT MULTIVITAMIN W/MINERALS CH: 1.0000 | ORAL_TABLET | Freq: Every day | ORAL | Status: DC

## 2022-09-20 MED ORDER — THIAMINE HCL 100 MG/ML IJ SOLN: 100.0000 mg | Freq: Once | INTRAMUSCULAR | Status: DC

## 2022-09-20 NOTE — ED Notes (Signed)
Pt is sleeping. No distress noted. Will continue to monitor for safety.

## 2022-09-20 NOTE — ED Notes (Signed)
Patient is presently asleep in bed without issue or complaint.  Patient without withdrawal or distress.  Will monitor.
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
## 2022-09-22 ENCOUNTER — Encounter (HOSPITAL_COMMUNITY): Payer: Self-pay

## 2022-09-22 ENCOUNTER — Emergency Department (HOSPITAL_COMMUNITY)
Admission: EM | Admit: 2022-09-22 | Discharge: 2022-09-22 | Disposition: A | Discharge status: Other Acute Inpatient Hospital | Payer: Commercial Managed Care - HMO | Attending: Emergency Medicine | Admitting: Emergency Medicine

## 2022-09-22 ENCOUNTER — Other Ambulatory Visit (HOSPITAL_COMMUNITY)
Admission: EM | Admit: 2022-09-22 | Discharge: 2022-09-25 | Disposition: A | Discharge status: Rehabilitation Facility | Payer: Commercial Managed Care - HMO | Attending: Psychiatry | Admitting: Psychiatry

## 2022-09-22 ENCOUNTER — Other Ambulatory Visit: Payer: Self-pay

## 2022-09-22 DIAGNOSIS — F1910 Other psychoactive substance abuse, uncomplicated: Secondary | ICD-10-CM | POA: Insufficient documentation

## 2022-09-22 DIAGNOSIS — F1410 Cocaine abuse, uncomplicated: Secondary | ICD-10-CM | POA: Diagnosis not present

## 2022-09-22 DIAGNOSIS — R464 Slowness and poor responsiveness: Secondary | ICD-10-CM | POA: Diagnosis not present

## 2022-09-22 DIAGNOSIS — Z1152 Encounter for screening for COVID-19: Secondary | ICD-10-CM | POA: Diagnosis not present

## 2022-09-22 DIAGNOSIS — Z79899 Other long term (current) drug therapy: Secondary | ICD-10-CM | POA: Diagnosis not present

## 2022-09-22 DIAGNOSIS — E162 Hypoglycemia, unspecified: Secondary | ICD-10-CM | POA: Insufficient documentation

## 2022-09-22 LAB — CBC WITH DIFFERENTIAL/PLATELET
Abs Immature Granulocytes: 0.04 10*3/uL (ref 0.00–0.07)
Basophils Absolute: 0 10*3/uL (ref 0.0–0.1)
Basophils Relative: 0 %
Eosinophils Absolute: 0 10*3/uL (ref 0.0–0.5)
Eosinophils Relative: 0 %
HCT: 39.9 % (ref 39.0–52.0)
Hemoglobin: 13.6 g/dL (ref 13.0–17.0)
Immature Granulocytes: 0 %
Lymphocytes Relative: 12 %
Lymphs Abs: 1.2 10*3/uL (ref 0.7–4.0)
MCH: 35 pg — ABNORMAL HIGH (ref 26.0–34.0)
MCHC: 34.1 g/dL (ref 30.0–36.0)
MCV: 102.6 fL — ABNORMAL HIGH (ref 80.0–100.0)
Monocytes Absolute: 0.5 10*3/uL (ref 0.1–1.0)
Monocytes Relative: 5 %
Neutro Abs: 8.3 10*3/uL — ABNORMAL HIGH (ref 1.7–7.7)
Neutrophils Relative %: 83 %
Platelets: 186 10*3/uL (ref 150–400)
RBC: 3.89 MIL/uL — ABNORMAL LOW (ref 4.22–5.81)
RDW: 12.2 % (ref 11.5–15.5)
WBC: 10.1 10*3/uL (ref 4.0–10.5)
nRBC: 0 % (ref 0.0–0.2)

## 2022-09-22 LAB — RAPID URINE DRUG SCREEN, HOSP PERFORMED
Amphetamines: POSITIVE — AB
Barbiturates: NOT DETECTED
Benzodiazepines: NOT DETECTED
Cocaine: POSITIVE — AB
Opiates: NOT DETECTED
Tetrahydrocannabinol: POSITIVE — AB

## 2022-09-22 LAB — ETHANOL: Alcohol, Ethyl (B): 69 mg/dL — ABNORMAL HIGH (ref <10)

## 2022-09-22 LAB — COMPREHENSIVE METABOLIC PANEL
ALT: 20 U/L (ref 0–44)
AST: 39 U/L (ref 15–41)
Albumin: 4.1 g/dL (ref 3.5–5.0)
Alkaline Phosphatase: 66 U/L (ref 38–126)
Anion gap: 15 (ref 5–15)
BUN: 16 mg/dL (ref 6–20)
CO2: 22 mmol/L (ref 22–32)
Calcium: 8.5 mg/dL — ABNORMAL LOW (ref 8.9–10.3)
Chloride: 98 mmol/L (ref 98–111)
Creatinine, Ser: 1.23 mg/dL (ref 0.61–1.24)
GFR, Estimated: >60 mL/min (ref >60)
Glucose, Bld: 155 mg/dL — ABNORMAL HIGH (ref 70–99)
Potassium: 4.2 mmol/L (ref 3.5–5.1)
Sodium: 135 mmol/L (ref 135–145)
Total Bilirubin: 0.6 mg/dL (ref 0.3–1.2)
Total Protein: 7 g/dL (ref 6.5–8.1)

## 2022-09-22 LAB — RESP PANEL BY RT-PCR (RSV, FLU A&B, COVID)  RVPGX2
Influenza A by PCR: NEGATIVE
Influenza B by PCR: NEGATIVE
Resp Syncytial Virus by PCR: NEGATIVE
SARS Coronavirus 2 by RT PCR: NEGATIVE

## 2022-09-22 LAB — ACETAMINOPHEN LEVEL: Acetaminophen (Tylenol), Serum: <10 ug/mL — ABNORMAL LOW (ref 10–30)

## 2022-09-22 LAB — SALICYLATE LEVEL: Salicylate Lvl: <7 mg/dL — ABNORMAL LOW (ref 7.0–30.0)

## 2022-09-22 MED ORDER — NICOTINE 21 MG/24HR TD PT24: 21.0000 mg | MEDICATED_PATCH | Freq: Every day | TRANSDERMAL | 0 refills | Status: DC

## 2022-09-22 MED ORDER — THIAMINE HCL 100 MG/ML IJ SOLN
100.0000 mg | Freq: Once | INTRAMUSCULAR | Status: AC
  Administered 2022-09-22: 100 mg via INTRAVENOUS
  Filled 2022-09-22: qty 2

## 2022-09-22 MED ORDER — MAGNESIUM HYDROXIDE 400 MG/5ML PO SUSP: 30.0000 mL | Freq: Every day | ORAL | Status: DC | PRN

## 2022-09-22 MED ORDER — LORAZEPAM 1 MG PO TABS: 0.0000 mg | ORAL_TABLET | Freq: Two times a day (BID) | ORAL | Status: DC

## 2022-09-22 MED ORDER — LORAZEPAM 1 MG PO TABS: 1.0000 mg | ORAL_TABLET | Freq: Four times a day (QID) | ORAL | Status: DC | PRN

## 2022-09-22 MED ORDER — LORAZEPAM 1 MG PO TABS: 0.0000 mg | ORAL_TABLET | Freq: Four times a day (QID) | ORAL | Status: DC

## 2022-09-22 MED ORDER — ALUM & MAG HYDROXIDE-SIMETH 200-200-20 MG/5ML PO SUSP: 30.0000 mL | ORAL | Status: DC | PRN

## 2022-09-22 MED ORDER — HYDROXYZINE HCL 25 MG PO TABS: 25.0000 mg | ORAL_TABLET | Freq: Three times a day (TID) | ORAL | Status: DC | PRN

## 2022-09-22 MED ORDER — NICOTINE 21 MG/24HR TD PT24
21.0000 mg | MEDICATED_PATCH | Freq: Every day | TRANSDERMAL | Status: DC
  Filled 2022-09-22: qty 1
  Filled 2022-09-22: qty 14
  Filled 2022-09-22: qty 1

## 2022-09-22 NOTE — ED Notes (Signed)
Pt is in the bed sleeping. Respirations are even and unlabored. No acute distress noted. Will continue to monitor for safety. 

## 2022-09-22 NOTE — Discharge Instructions (Addendum)
Please go to behavior health urgent care for resources for substance abuse.  Please follow-up with your primary doctor in 2 to 3 days regarding your low blood sugar.

## 2022-09-22 NOTE — ED Notes (Signed)
Called [facility] for  to transport to [facility]

## 2022-09-22 NOTE — ED Notes (Signed)
Patient provided with juice sack lunch and grahm crackers per provider orders
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
## 2022-09-22 NOTE — ED Provider Notes (Signed)
Emergency Medicine Observation Re-evaluation Note  Calvin [name] is a 37 y.o. male, seen on rounds today.  Pt initially presented to the ED for complaints of Hypoglycemia (Patient arrives via ems from home after using percocet cocaine mushrooms for past several days and not eating. Patient found  disoriented sweaty and had bgl of 53 given oral and iv glucose arrived to er room a/o x4 bgl 119) Currently, the patient is pleasant, sitting and eating.  Physical Exam  BP 96/62   Pulse 70   Temp 98 F (36.7 C) (Oral)   Resp 16   Ht 1.854 m ('6\' 1"'$ )   Wt 65.8 kg   SpO2 99%   BMI 19.13 kg/m  Physical Exam General:  well-nourished male does not appear to be in acute distress Cardiac: Normal heart rate Lungs: No respiratory distress with normal oxygen saturations Psych: Patient alert speaking to me clearly and eating  ED Course / MDM  EKG:   I have reviewed the labs performed to date as well as medications administered while in observation.  Recent changes in the last 24 hours include patient had an episode of hypoglycemia likely secondary to substance abuse.  Here in the ED he has been observed and has been taking p.o. and maintain blood sugar normally he has been seen and evaluated by psychiatry and is returning to the behavioral health urgent care.  Plan  Current plan is for transfer back to behavioral health urgent care.    Pattricia Boss, MD 09/22/22 1324

## 2022-09-22 NOTE — ED Notes (Signed)
Patient observed/assessed in bed in patient room. Patient alert and oriented x 4. Affect is flat. Patient denies pain and anxiety. He denies A/V/H. He denies having any thoughts/plan of self harm and harm towards others. Fluid and snack offered. Patient states that appetite has been good throughout the day. Verbalizes no further complaints at this time. Will continue to monitor and support.

## 2022-09-22 NOTE — ED Notes (Signed)
Pt is voluntary consent form attached to the clipboard in blue zone

## 2022-09-22 NOTE — ED Notes (Signed)
Pt transferred from [facility] to [facility] requesting detox from cocaine and amphetamines. Pt states, "I already have been accepted to [facility] tomorrow morning. They are aware of my situation. I just need to be here until I leave tomorrow". Calm, cooperative throughout interview process. Pt denies SI/HI/AVH. Skin assessment completed. Oriented to unit. Meal and drink offered. Pt denies withdrawal sx at current. Pt verbally contract for safety. Will monitor for safety.

## 2022-09-22 NOTE — Discharge Planning (Signed)
LCSW followed up with Enis Gash at [facility] on this morning regarding plans to discharge to their facility on tomorrow morning by 7:45am. Enis Gash confirmed that patient has been accepted and can transfer at that time. MD and staff were made aware.   LCSW attempted to follow back up with [facility] regarding admission and patient possibly testing positive once he presents. LCSW left voice message requesting phone call back regarding referral.   LCSW contacted [facility] to follow up regarding referral. LCSW spoke with Admissions Coordinator Estill Bamberg who reports file has been created for patient, however referral has not been received. LCSW emailed referral to [email or web address]. P{er Estill Bamberg, she will follow up once referral has been reviewed.   Lucius Conn, LCSW Clinical Social Worker [facility] Ph: [phone]

## 2022-09-22 NOTE — ED Provider Notes (Signed)
[facility] Provider Note   CSN: [id number] Arrival date & time: 09/22/22  0143     History  Chief Complaint  Patient presents with   Hypoglycemia    Patient arrives via ems from home after using percocet cocaine mushrooms for past several days and not eating. Patient found  disoriented sweaty and had bgl of 53 given oral and iv glucose arrived to er room a/o x4 bgl Calvin [name] is a 37 y.o. [name].  Patient presents to the emergency department after EMS was called for unresponsiveness.  Patient reports that he has been using Percocet and cocaine.  He has not eaten in at least today.  EMS found him to be hypoglycemic upon their arrival.  He was given dextrose and became awake and alert.  Patient currently without complaints other than stating that he needs to go to rehab.       Home Medications Prior to Admission medications   Medication Sig Start Date End Date Taking? Authorizing Provider  Multiple Vitamin (MULTIVITAMIN WITH MINERALS) TABS tablet Take 1 tablet by mouth daily. Patient not taking: Reported on 09/22/2022 09/20/22   Armando Reichert, MD  thiamine (VITAMIN B1) 100 MG tablet Take 1 tablet (100 mg total) by mouth daily. Patient not taking: Reported on 09/22/2022 09/21/22   Armando Reichert, MD      Allergies    Tylenol [acetaminophen]    Review of Systems   Review of Systems  Physical Exam Updated Vital Signs BP 110/75   Pulse 74   Temp 97.9 F (36.6 C) (Oral)   Resp 15   Ht '6\' 1"'$  (1.854 m)   Wt 65.8 kg   SpO2 98%   BMI 19.13 kg/m  Physical Exam Vitals and nursing note reviewed.  Constitutional:      General: He is not in acute distress.    Appearance: He is well-developed.  HENT:     Head: Normocephalic and atraumatic.     Mouth/Throat:     Mouth: Mucous membranes are moist.  Eyes:     General: Vision grossly intact. Gaze aligned appropriately.     Extraocular Movements: Extraocular movements intact.      Conjunctiva/sclera: Conjunctivae normal.  Cardiovascular:     Rate and Rhythm: Normal rate and regular rhythm.     Pulses: Normal pulses.     Heart sounds: Normal heart sounds, S1 normal and S2 normal. No murmur heard.    No friction rub. No gallop.  Pulmonary:     Effort: Pulmonary effort is normal. No respiratory distress.     Breath sounds: Normal breath sounds.  Abdominal:     Palpations: Abdomen is soft.     Tenderness: There is no abdominal tenderness. There is no guarding or rebound.     Hernia: No hernia is present.  Musculoskeletal:        General: No swelling.     Cervical back: Full passive range of motion without pain, normal range of motion and neck supple. No pain with movement, spinous process tenderness or muscular tenderness. Normal range of motion.     Right lower leg: No edema.     Left lower leg: No edema.  Skin:    General: Skin is warm and dry.     Capillary Refill: Capillary refill takes less than 2 seconds.     Findings: No ecchymosis, erythema, lesion or wound.  Neurological:     Mental Status: He is alert  and oriented to person, place, and time.     GCS: GCS eye subscore is 4. GCS verbal subscore is 5. GCS motor subscore is 6.     Cranial Nerves: Cranial nerves 2-12 are intact.     Sensory: Sensation is intact.     Motor: Motor function is intact. No weakness or abnormal muscle tone.     Coordination: Coordination is intact.  Psychiatric:        Mood and Affect: Mood normal.        Speech: Speech normal.        Behavior: Behavior normal.     ED Results / Procedures / Treatments   Labs (all labs ordered are listed, but only abnormal results are displayed) Labs Reviewed  CBC WITH DIFFERENTIAL/PLATELET - Abnormal; Notable for the following components:      Result Value   RBC 3.89 (*)    MCV 102.6 (*)    MCH 35.0 (*)    Neutro Abs 8.3 (*)    All other components within normal limits  COMPREHENSIVE METABOLIC PANEL - Abnormal; Notable for the  following components:   Glucose, Bld 155 (*)    Calcium 8.5 (*)    All other components within normal limits  ETHANOL - Abnormal; Notable for the following components:   Alcohol, Ethyl (B) 69 (*)    All other components within normal limits  RAPID URINE DRUG SCREEN, HOSP PERFORMED - Abnormal; Notable for the following components:   Cocaine POSITIVE (*)    Amphetamines POSITIVE (*)    Tetrahydrocannabinol POSITIVE (*)    All other components within normal limits  ACETAMINOPHEN LEVEL - Abnormal; Notable for the following components:   Acetaminophen (Tylenol), Serum <10 (*)    All other components within normal limits  SALICYLATE LEVEL - Abnormal; Notable for the following components:   Salicylate Lvl Q000111Q (*)    All other components within normal limits    EKG None  Radiology No results found.  Procedures Procedures    Medications Ordered in ED Medications  LORazepam (ATIVAN) tablet 0-4 mg ( Oral Not Given 09/22/22 0500)  LORazepam (ATIVAN) tablet 0-4 mg (has no administration in time range)  thiamine (VITAMIN B1) injection 100 mg (100 mg Intravenous Given 09/22/22 0547)    ED Course/ Medical Decision Making/ A&P                             Medical Decision Making Amount and/or Complexity of Data Reviewed Labs: ordered.  Risk Prescription drug management.   Presented with altered mental status.  EMS identified hypoglycemia and treated appropriately.  Patient awake and alert at arrival.  He is not diabetic.  He was hypoglycemic because he has not eaten in days, instead using drugs.  He appears to have a severe addiction.  His medical screening was unremarkable.  He has maintained his sugars.  Medically clear for psychiatric evaluation for possible inpatient treatment.        Final Clinical Impression(s) / ED Diagnoses Final diagnoses:  Hypoglycemia  Polysubstance abuse [facility])    Rx / DC Orders ED Discharge Orders     None         Kaylin Marcon, Gwenyth Allegra,  MD 09/22/22 [phone]

## 2022-09-22 NOTE — ED Provider Notes (Signed)
Facility Based Crisis Admission H&P  Date: 09/22/22 Patient Name: Calvin [name] MRN: [id number] Chief Complaint: Relapse  Diagnoses:  Final diagnoses:  Cocaine abuse [facility])    HPI:  The patient is a 37 year old male with history of alcohol use disorder, cocaine use disorder, and cannabis use disorder.  The patient was discharged from the facility based crisis 2 days ago.  He left before receiving residential rehab placement and said that he would go live with his girlfriend and get placement at [facility] treatment center on his own.  The patient was found unresponsive and was brought to the emergency department.  He was noted to be hypoglycemic by EMS and was given D50.  His care was transferred to the facility based crisis for residential rehab placement.  On assessment 3/4, the patient exhibits a linear and logical thought process.  He states the relationship with his girlfriend ended quickly after he left.  He states that he started using the same substances that he was using prior to the previous admission.  He again reports that he does not experience alcohol withdrawal symptoms and is not having any kind of withdrawal presently.  The patient denies having any medical problems aside from asthma.  He states that he has had episodes of "low blood sugar" before.  Last A1c in December was 4.9.  The patient denies auditory/visual hallucinations.  The patient reports good mood, appetite, and sleep. They deny suicidal and homicidal thoughts. The patient denies side effects from their medications.  Review of systems as below. The patient denies experiencing any withdrawal symptoms.   The patient states that he is interested in residential rehab at [place] or [facility].  He reports that he is willing to call [place] numbers to try and find a sober living housing.   PHQ 2-9:  [facility] ED from 09/18/2022 in [facility] ED from 11/05/2021 in  [facility]  Thoughts that you would be better off dead, or of hurting yourself in some way Not at all Not at all  PHQ-9 Total Score 12 13       Flowsheet Row ED from 09/22/2022 in [facility] Most recent reading at 09/22/2022  2:08 PM ED from 09/22/2022 in [facility] Emergency Department at [facility] Most recent reading at 09/22/2022  2:07 AM ED from 09/18/2022 in [facility] Most recent reading at 09/18/2022 10:43 PM  C-SSRS RISK CATEGORY No Risk No Risk No Risk       Total Time spent with patient: 20 minutes  Musculoskeletal  Strength & Muscle Tone: within normal limits Gait & Station: normal Patient leans: N/A  Psychiatric Specialty Exam  Presentation General Appearance: Appropriate for Environment  Eye Contact:Fair  Speech:Clear and Coherent  Speech Volume:Normal  Handedness:-- (not assessed)   Mood and Affect  Mood:Euthymic  Affect:Congruent   Thought Process  Thought Processes:Coherent; Linear  Descriptions of Associations:Intact  Orientation:Full (Time, Place and Person)  Thought Content:Logical    Hallucinations:Hallucinations: None  Ideas of Reference:None  Suicidal Thoughts:Suicidal Thoughts: No  Homicidal Thoughts:Homicidal Thoughts: No   Sensorium  Memory:Immediate Fair; Recent Fair; Remote Fair  Judgment:Fair  Insight:Fair   Executive Functions  Concentration:Fair  Attention Span:Fair  [place]   Psychomotor Activity  Psychomotor Activity:Psychomotor Activity: Normal   Assets  Assets:Communication Skills; Resilience   Sleep  Sleep:Sleep: Fair   Nutritional Assessment (For OBS and FBC admissions  only) Has the patient had a weight loss or gain of 10 pounds or more in the last 3 months?: No Has the patient had a decrease in food intake/or appetite?: Yes Does the patient have dental  problems?: No Does the patient have eating habits or behaviors that may be indicators of an eating disorder including binging or inducing vomiting?: No Has the patient recently lost weight without trying?: 0 Has the patient been eating poorly because of a decreased appetite?: 0 Malnutrition Screening Tool Score: 0    Physical Exam Constitutional:      Appearance: the patient is not toxic-appearing.  Pulmonary:     Effort: Pulmonary effort is normal.  Neurological:     General: No focal deficit present.     Mental Status: the patient is alert and oriented to person, place, and time.   Review of Systems  Respiratory:  Negative for shortness of breath.   Cardiovascular:  Negative for chest pain.  Gastrointestinal:  Negative for abdominal pain, constipation, diarrhea, nausea and vomiting.  Neurological:  Negative for headaches.    BP 123/68 (BP Location: Left Arm)   Pulse 69   Temp 97.6 F (36.4 C) (Oral)   Resp 18   SpO2 99%    Past Psychiatric History: as above   Is the patient at risk to self? No  Has the patient been a risk to self in the past 6 months? No .    Has the patient been a risk to self within the distant past? No   Is the patient a risk to others? No   Has the patient been a risk to others in the past 6 months? No   Has the patient been a risk to others within the distant past? No   Past Medical History: as above Family History: none Social History: as above   Last Labs:  Admission on 09/22/2022, Discharged on 09/22/2022  Component Date Value Ref Range Status   WBC 09/22/2022 10.1  4.0 - 10.5 K/uL Final   RBC 09/22/2022 3.89 (L)  4.22 - 5.81 MIL/uL Final   Hemoglobin 09/22/2022 13.6  13.0 - 17.0 g/dL Final   HCT 09/22/2022 39.9  39.0 - 52.0 % Final   MCV 09/22/2022 102.6 (H)  80.0 - 100.0 fL Final   MCH 09/22/2022 35.0 (H)  26.0 - 34.0 pg Final   MCHC 09/22/2022 34.1  30.0 - 36.0 g/dL Final   RDW 09/22/2022 12.2  11.5 - 15.5 % Final   Platelets  09/22/2022 186  150 - 400 K/uL Final   nRBC 09/22/2022 0.0  0.0 - 0.2 % Final   Neutrophils Relative % 09/22/2022 83  % Final   Neutro Abs 09/22/2022 8.3 (H)  1.7 - 7.7 K/uL Final   Lymphocytes Relative 09/22/2022 12  % Final   Lymphs Abs 09/22/2022 1.2  0.7 - 4.0 K/uL Final   Monocytes Relative 09/22/2022 5  % Final   Monocytes Absolute 09/22/2022 0.5  0.1 - 1.0 K/uL Final   Eosinophils Relative 09/22/2022 0  % Final   Eosinophils Absolute 09/22/2022 0.0  0.0 - 0.5 K/uL Final   Basophils Relative 09/22/2022 0  % Final   Basophils Absolute 09/22/2022 0.0  0.0 - 0.1 K/uL Final   Immature Granulocytes 09/22/2022 0  % Final   Abs Immature Granulocytes 09/22/2022 0.04  0.00 - 0.07 K/uL Final   Performed at [facility], [place] [address] [place], [place] [address]   Sodium 09/22/2022 135  135 -  145 mmol/L Final   Potassium 09/22/2022 4.2  3.5 - 5.1 mmol/L Final   Chloride 09/22/2022 98  98 - 111 mmol/L Final   CO2 09/22/2022 22  22 - 32 mmol/L Final   Glucose, Bld 09/22/2022 155 (H)  70 - 99 mg/dL Final   Glucose reference range applies only to samples taken after fasting for at least 8 hours.   BUN 09/22/2022 16  6 - 20 mg/dL Final   Creatinine, Ser 09/22/2022 1.23  0.61 - 1.24 mg/dL Final   Calcium 09/22/2022 8.5 (L)  8.9 - 10.3 mg/dL Final   Total Protein 09/22/2022 7.0  6.5 - 8.1 g/dL Final   Albumin 09/22/2022 4.1  3.5 - 5.0 g/dL Final   AST 09/22/2022 39  15 - 41 U/L Final   ALT 09/22/2022 20  0 - 44 U/L Final   Alkaline Phosphatase 09/22/2022 66  38 - 126 U/L Final   Total Bilirubin 09/22/2022 0.6  0.3 - 1.2 mg/dL Final   GFR, Estimated 09/22/2022 >60  >60 mL/min Final   Comment: (NOTE) Calculated using the CKD-EPI Creatinine Equation (2021)    Anion gap 09/22/2022 15  5 - 15 Final   Performed at [place] [address], [place], [place] [address]   Alcohol, Ethyl (B) 09/22/2022 69 (H)  <10 mg/dL Final   Comment: (NOTE) Lowest detectable limit for serum alcohol  is 10 mg/dL.  For medical purposes only. Performed at [facility], [place] [address], [place], [place] [address]    Opiates 09/22/2022 NONE DETECTED  NONE DETECTED Final   Cocaine 09/22/2022 POSITIVE (A)  NONE DETECTED Final   Benzodiazepines 09/22/2022 NONE DETECTED  NONE DETECTED Final   Amphetamines 09/22/2022 POSITIVE (A)  NONE DETECTED Final   Tetrahydrocannabinol 09/22/2022 POSITIVE (A)  NONE DETECTED Final   Barbiturates 09/22/2022 NONE DETECTED  NONE DETECTED Final   Comment: (NOTE) DRUG SCREEN FOR MEDICAL PURPOSES ONLY.  IF CONFIRMATION IS NEEDED FOR ANY PURPOSE, NOTIFY LAB WITHIN 5 DAYS.  LOWEST DETECTABLE LIMITS FOR URINE DRUG SCREEN Drug Class                     Cutoff (ng/mL) Amphetamine and metabolites    1000 Barbiturate and metabolites    200 Benzodiazepine                 200 Opiates and metabolites        300 Cocaine and metabolites        300 THC                            50 Performed at [facility], [place] [address] [place], [place] [address]    Acetaminophen (Tylenol), Serum 09/22/2022 <10 (L)  10 - 30 ug/mL Final   Comment: (NOTE) Therapeutic concentrations vary significantly. A range of 10-30 ug/mL  may be an effective concentration for many patients. However, some  are best treated at concentrations outside of this range. Acetaminophen concentrations >150 ug/mL at 4 hours after ingestion  and >50 ug/mL at 12 hours after ingestion are often associated with  toxic reactions.  Performed at [facility], [place] [address] [place], [place] [id number]    Salicylate Lvl [id number] <7.0 (L)  7.0 - 30.0 mg/dL Final   Performed at [place] [address] [place], [place] [address]   SARS Coronavirus 2 by RT PCR 09/22/2022  NEGATIVE  NEGATIVE Final   Influenza A by PCR 09/22/2022 NEGATIVE  NEGATIVE Final   Influenza B by PCR 09/22/2022 NEGATIVE  NEGATIVE Final   Comment: (NOTE) The Xpert Xpress SARS-CoV-2/FLU/RSV plus assay is  intended as an aid in the diagnosis of influenza from Nasopharyngeal swab specimens and should not be used as a sole basis for treatment. Nasal washings and aspirates are unacceptable for Xpert Xpress SARS-CoV-2/FLU/RSV testing.  Fact Sheet for Patients: EntrepreneurPulse.com.au  Fact Sheet for Healthcare Providers: IncredibleEmployment.be  This test is not yet approved or cleared by the [place] FDA and has been authorized for detection and/or diagnosis of SARS-CoV-2 by FDA under an Emergency Use Authorization (EUA). This EUA will remain in effect (meaning this test can be used) for the duration of the COVID-19 declaration under Section 564(b)(1) of the Act, 21 U.S.C. section 360bbb-3(b)(1), unless the authorization is terminated or revoked.     Resp Syncytial Virus by PCR 09/22/2022 NEGATIVE  NEGATIVE Final   Comment: (NOTE) Fact Sheet for Patients: EntrepreneurPulse.com.au  Fact Sheet for Healthcare Providers: IncredibleEmployment.be  This test is not yet approved or cleared by the [place] FDA and has been authorized for detection and/or diagnosis of SARS-CoV-2 by FDA under an Emergency Use Authorization (EUA). This EUA will remain in effect (meaning this test can be used) for the duration of the COVID-19 declaration under Section 564(b)(1) of the Act, 21 U.S.C. section 360bbb-3(b)(1), unless the authorization is terminated or revoked.  Performed at [facility], [place] [address] [place], [place] [address]   Admission on 09/18/2022, Discharged on 09/20/2022  Component Date Value Ref Range Status   SARS Coronavirus 2 by RT PCR 09/18/2022 NEGATIVE  NEGATIVE Final   Influenza A by PCR 09/18/2022 NEGATIVE  NEGATIVE Final   Influenza B by PCR 09/18/2022 NEGATIVE  NEGATIVE Final   Comment: (NOTE) The Xpert Xpress SARS-CoV-2/FLU/RSV plus assay is intended as an aid in the diagnosis of  influenza from Nasopharyngeal swab specimens and should not be used as a sole basis for treatment. Nasal washings and aspirates are unacceptable for Xpert Xpress SARS-CoV-2/FLU/RSV testing.  Fact Sheet for Patients: EntrepreneurPulse.com.au  Fact Sheet for Healthcare Providers: IncredibleEmployment.be  This test is not yet approved or cleared by the [place] FDA and has been authorized for detection and/or diagnosis of SARS-CoV-2 by FDA under an Emergency Use Authorization (EUA). This EUA will remain in effect (meaning this test can be used) for the duration of the COVID-19 declaration under Section 564(b)(1) of the Act, 21 U.S.C. section 360bbb-3(b)(1), unless the authorization is terminated or revoked.     Resp Syncytial Virus by PCR 09/18/2022 NEGATIVE  NEGATIVE Final   Comment: (NOTE) Fact Sheet for Patients: EntrepreneurPulse.com.au  Fact Sheet for Healthcare Providers: IncredibleEmployment.be  This test is not yet approved or cleared by the [place] FDA and has been authorized for detection and/or diagnosis of SARS-CoV-2 by FDA under an Emergency Use Authorization (EUA). This EUA will remain in effect (meaning this test can be used) for the duration of the COVID-19 declaration under Section 564(b)(1) of the Act, 21 U.S.C. section 360bbb-3(b)(1), unless the authorization is terminated or revoked.  Performed at [facility], [place] [address] [place], [place] [address]    WBC 09/18/2022 6.7  4.0 - 10.5 K/uL Final   RBC 09/18/2022 4.42  4.22 - 5.81 MIL/uL Final   Hemoglobin 09/18/2022 15.4  13.0 - 17.0 g/dL Final   HCT 09/18/2022 44.6  39.0 - 52.0 %  Final   MCV 09/18/2022 100.9 (H)  80.0 - 100.0 fL Final   MCH 09/18/2022 34.8 (H)  26.0 - 34.0 pg Final   MCHC 09/18/2022 34.5  30.0 - 36.0 g/dL Final   RDW 09/18/2022 11.9  11.5 - 15.5 % Final   Platelets 09/18/2022 219  150 - 400 K/uL  Final   nRBC 09/18/2022 0.0  0.0 - 0.2 % Final   Neutrophils Relative % 09/18/2022 53  % Final   Neutro Abs 09/18/2022 3.5  1.7 - 7.7 K/uL Final   Lymphocytes Relative 09/18/2022 39  % Final   Lymphs Abs 09/18/2022 2.6  0.7 - 4.0 K/uL Final   Monocytes Relative 09/18/2022 7  % Final   Monocytes Absolute 09/18/2022 0.5  0.1 - 1.0 K/uL Final   Eosinophils Relative 09/18/2022 1  % Final   Eosinophils Absolute 09/18/2022 0.1  0.0 - 0.5 K/uL Final   Basophils Relative 09/18/2022 0  % Final   Basophils Absolute 09/18/2022 0.0  0.0 - 0.1 K/uL Final   Immature Granulocytes 09/18/2022 0  % Final   Abs Immature Granulocytes 09/18/2022 0.01  0.00 - 0.07 K/uL Final   Performed at [facility], [place] [address], [place], [place] [address]   Sodium 09/18/2022 139  135 - 145 mmol/L Final   Potassium 09/18/2022 4.0  3.5 - 5.1 mmol/L Final   Chloride 09/18/2022 99  98 - 111 mmol/L Final   CO2 09/18/2022 27  22 - 32 mmol/L Final   Glucose, Bld 09/18/2022 62 (L)  70 - 99 mg/dL Final   Glucose reference range applies only to samples taken after fasting for at least 8 hours.   BUN 09/18/2022 9  6 - 20 mg/dL Final   Creatinine, Ser 09/18/2022 0.97  0.61 - 1.24 mg/dL Final   Calcium 09/18/2022 9.4  8.9 - 10.3 mg/dL Final   Total Protein 09/18/2022 8.2 (H)  6.5 - 8.1 g/dL Final   Albumin 09/18/2022 4.4  3.5 - 5.0 g/dL Final   AST 09/18/2022 34  15 - 41 U/L Final   ALT 09/18/2022 23  0 - 44 U/L Final   Alkaline Phosphatase 09/18/2022 67  38 - 126 U/L Final   Total Bilirubin 09/18/2022 0.6  0.3 - 1.2 mg/dL Final   GFR, Estimated 09/18/2022 >60  >60 mL/min Final   Comment: (NOTE) Calculated using the CKD-EPI Creatinine Equation (2021)    Anion gap 09/18/2022 13  5 - 15 Final   Performed at [facility], [place] [address], [place], [place] [address]   Hgb A1c MFr Bld 09/18/2022 4.9  4.8 - 5.6 % Final   Comment: (NOTE)         Prediabetes: 5.7 - 6.4         Diabetes: >6.4         Glycemic control  for adults with diabetes: <7.0    Mean Plasma Glucose 09/18/2022 94  mg/dL Final   Comment: (NOTE) Performed At: [facility] [place], [place] [id number] Rush Farmer MD [id number]    Cholesterol 09/18/2022 135  0 - 200 mg/dL Final   Triglycerides 09/18/2022 72  <150 mg/dL Final   HDL 09/18/2022 72  >40 mg/dL Final   Total CHOL/HDL Ratio 09/18/2022 1.9  RATIO Final   VLDL 09/18/2022 14  0 - 40 mg/dL Final   LDL Cholesterol 09/18/2022 49  0 - 99 mg/dL Final   Comment:        Total Cholesterol/HDL:CHD  Risk Coronary Heart Disease Risk Table                     Men   Women  1/2 Average Risk   3.4   3.3  Average Risk       5.0   4.4  2 X Average Risk   9.6   7.1  3 X Average Risk  23.4   11.0        Use the calculated Patient Ratio above and the CHD Risk Table to determine the patient's CHD Risk.        ATP III CLASSIFICATION (LDL):  <100     mg/dL   Optimal  100-129  mg/dL   Near or Above                    Optimal  130-159  mg/dL   Borderline  160-189  mg/dL   High  >190     mg/dL   Very High Performed at [place] [address] [place], [place] [address]    TSH 09/18/2022 1.267  0.350 - 4.500 uIU/mL Final   Comment: Performed by a 3rd Generation assay with a functional sensitivity of <=0.01 uIU/mL. Performed at [facility], [place] [address] [place], [place] [address]    POC Amphetamine UR 09/18/2022 None Detected  NONE DETECTED (Cut Off Level 1000 ng/mL) Preliminary   POC Secobarbital (BAR) 09/18/2022 None Detected  NONE DETECTED (Cut Off Level 300 ng/mL) Preliminary   POC Buprenorphine (BUP) 09/18/2022 None Detected  NONE DETECTED (Cut Off Level 10 ng/mL) Preliminary   POC Oxazepam (BZO) 09/18/2022 None Detected  NONE DETECTED (Cut Off Level 300 ng/mL) Preliminary   POC Cocaine UR 09/18/2022 None Detected  NONE DETECTED (Cut Off Level 300 ng/mL) Preliminary   POC Methamphetamine UR 09/18/2022 Positive (A)  NONE DETECTED (Cut  Off Level 1000 ng/mL) Preliminary   POC Morphine 09/18/2022 None Detected  NONE DETECTED (Cut Off Level 300 ng/mL) Preliminary   POC Methadone UR 09/18/2022 None Detected  NONE DETECTED (Cut Off Level 300 ng/mL) Preliminary   POC Oxycodone UR 09/18/2022 Positive (A)  NONE DETECTED (Cut Off Level 100 ng/mL) Preliminary   POC Marijuana UR 09/18/2022 Positive (A)  NONE DETECTED (Cut Off Level 50 ng/mL) Preliminary   SARSCOV2ONAVIRUS 2 AG 09/18/2022 NEGATIVE  NEGATIVE Final   Comment: (NOTE) SARS-CoV-2 antigen NOT DETECTED.   Negative results are presumptive.  Negative results do not preclude SARS-CoV-2 infection and should not be used as the sole basis for treatment or other patient management decisions, including infection  control decisions, particularly in the presence of clinical signs and  symptoms consistent with COVID-19, or in those who have been in contact with the virus.  Negative results must be combined with clinical observations, patient history, and epidemiological information. The expected result is Negative.  Fact Sheet for Patients: HandmadeRecipes.com.cy  Fact Sheet for Healthcare Providers: FuneralLife.at  This test is not yet approved or cleared by the [place] FDA and  has been authorized for detection and/or diagnosis of SARS-CoV-2 by FDA under an Emergency Use Authorization (EUA).  This EUA will remain in effect (meaning this test can be used) for the duration of  the COV                          ID-19 declaration under Section 564(b)(1) of the Act, 21 U.S.C. section 360bbb-3(b)(1), unless the authorization  is terminated or revoked sooner.    Admission on 08/05/2022, Discharged on 08/06/2022  Component Date Value Ref Range Status   Lipase 08/05/2022 49  11 - 51 U/L Final   Performed at [place] [address], [place], [place] [address]   Sodium 08/05/2022 136  135 - 145 mmol/L Final   Potassium  08/05/2022 3.9  3.5 - 5.1 mmol/L Final   Chloride 08/05/2022 102  98 - 111 mmol/L Final   CO2 08/05/2022 26  22 - 32 mmol/L Final   Glucose, Bld 08/05/2022 145 (H)  70 - 99 mg/dL Final   Glucose reference range applies only to samples taken after fasting for at least 8 hours.   BUN 08/05/2022 8  6 - 20 mg/dL Final   Creatinine, Ser 08/05/2022 1.01  0.61 - 1.24 mg/dL Final   Calcium 08/05/2022 9.0  8.9 - 10.3 mg/dL Final   Total Protein 08/05/2022 7.1  6.5 - 8.1 g/dL Final   Albumin 08/05/2022 3.9  3.5 - 5.0 g/dL Final   AST 08/05/2022 27  15 - 41 U/L Final   ALT 08/05/2022 15  0 - 44 U/L Final   Alkaline Phosphatase 08/05/2022 64  38 - 126 U/L Final   Total Bilirubin 08/05/2022 0.5  0.3 - 1.2 mg/dL Final   GFR, Estimated 08/05/2022 >60  >60 mL/min Final   Comment: (NOTE) Calculated using the CKD-EPI Creatinine Equation (2021)    Anion gap 08/05/2022 8  5 - 15 Final   Performed at [facility], [place] [address] [place], [place] [address]   WBC 08/05/2022 6.7  4.0 - 10.5 K/uL Final   RBC 08/05/2022 4.25  4.22 - 5.81 MIL/uL Final   Hemoglobin 08/05/2022 14.7  13.0 - 17.0 g/dL Final   HCT 08/05/2022 44.2  39.0 - 52.0 % Final   MCV 08/05/2022 104.0 (H)  80.0 - 100.0 fL Final   MCH 08/05/2022 34.6 (H)  26.0 - 34.0 pg Final   MCHC 08/05/2022 33.3  30.0 - 36.0 g/dL Final   RDW 08/05/2022 11.9  11.5 - 15.5 % Final   Platelets 08/05/2022 209  150 - 400 K/uL Final   nRBC 08/05/2022 0.0  0.0 - 0.2 % Final   Performed at [place] [address] [place], [place] [address]   Color, Urine 08/05/2022 YELLOW  YELLOW Final   APPearance 08/05/2022 CLEAR  CLEAR Final   Specific Gravity, Urine 08/05/2022 1.025  1.005 - 1.030 Final   pH 08/05/2022 6.0  5.0 - 8.0 Final   Glucose, UA 08/05/2022 50 (A)  NEGATIVE mg/dL Final   Hgb urine dipstick 08/05/2022 SMALL (A)  NEGATIVE Final   Bilirubin Urine 08/05/2022 NEGATIVE  NEGATIVE Final   Ketones, ur 08/05/2022 NEGATIVE  NEGATIVE mg/dL  Final   Protein, ur 08/05/2022 30 (A)  NEGATIVE mg/dL Final   Nitrite 08/05/2022 NEGATIVE  NEGATIVE Final   Leukocytes,Ua 08/05/2022 SMALL (A)  NEGATIVE Final   RBC / HPF 08/05/2022 6-10  0 - 5 RBC/hpf Final   WBC, UA 08/05/2022 11-20  0 - 5 WBC/hpf Final   Bacteria, UA 08/05/2022 RARE (A)  NONE SEEN Final   Squamous Epithelial / HPF 08/05/2022 0-5  0 - 5 /HPF Final   Mucus 08/05/2022 PRESENT   Final   Hyaline Casts, UA 08/05/2022 PRESENT   Final   Performed at [facility], [place] [address] [place], [place] [address]  Admission on 07/05/2022, Discharged on 07/06/2022  Component Date Value Ref Range Status   SARS Coronavirus 2 by RT PCR 07/05/2022 NEGATIVE  NEGATIVE Final   Comment: (NOTE) SARS-CoV-2 target nucleic acids are NOT DETECTED.  The SARS-CoV-2 RNA is generally detectable in upper respiratory specimens during the acute phase of infection. The lowest concentration of SARS-CoV-2 viral copies this assay can detect is 138 copies/mL. A negative result does not preclude SARS-Cov-2 infection and should not be used as the sole basis for treatment or other patient management decisions. A negative result may occur with  improper specimen collection/handling, submission of specimen other than nasopharyngeal swab, presence of viral mutation(s) within the areas targeted by this assay, and inadequate number of viral copies(<138 copies/mL). A negative result must be combined with clinical observations, patient history, and epidemiological information. The expected result is Negative.  Fact Sheet for Patients:  EntrepreneurPulse.com.au  Fact Sheet for Healthcare Providers:  IncredibleEmployment.be  This test is no                          t yet approved or cleared by the [place] FDA and  has been authorized for detection and/or diagnosis of SARS-CoV-2 by FDA under an Emergency Use Authorization (EUA). This EUA will remain  in effect  (meaning this test can be used) for the duration of the COVID-19 declaration under Section 564(b)(1) of the Act, 21 U.S.C.section 360bbb-3(b)(1), unless the authorization is terminated  or revoked sooner.       Influenza A by PCR 07/05/2022 NEGATIVE  NEGATIVE Final   Influenza B by PCR 07/05/2022 NEGATIVE  NEGATIVE Final   Comment: (NOTE) The Xpert Xpress SARS-CoV-2/FLU/RSV plus assay is intended as an aid in the diagnosis of influenza from Nasopharyngeal swab specimens and should not be used as a sole basis for treatment. Nasal washings and aspirates are unacceptable for Xpert Xpress SARS-CoV-2/FLU/RSV testing.  Fact Sheet for Patients: EntrepreneurPulse.com.au  Fact Sheet for Healthcare Providers: IncredibleEmployment.be  This test is not yet approved or cleared by the [place] FDA and has been authorized for detection and/or diagnosis of SARS-CoV-2 by FDA under an Emergency Use Authorization (EUA). This EUA will remain in effect (meaning this test can be used) for the duration of the COVID-19 declaration under Section 564(b)(1) of the Act, 21 U.S.C. section 360bbb-3(b)(1), unless the authorization is terminated or revoked.     Resp Syncytial Virus by PCR 07/05/2022 NEGATIVE  NEGATIVE Final   Comment: (NOTE) Fact Sheet for Patients: EntrepreneurPulse.com.au  Fact Sheet for Healthcare Providers: IncredibleEmployment.be  This test is not yet approved or cleared by the [place] FDA and has been authorized for detection and/or diagnosis of SARS-CoV-2 by FDA under an Emergency Use Authorization (EUA). This EUA will remain in effect (meaning this test can be used) for the duration of the COVID-19 declaration under Section 564(b)(1) of the Act, 21 U.S.C. section 360bbb-3(b)(1), unless the authorization is terminated or revoked.  Performed at [facility], [place] [address], [place],  [place] [address]    WBC 07/05/2022 6.9  4.0 - 10.5 K/uL Final   RBC 07/05/2022 3.75 (L)  4.22 - 5.81 MIL/uL Final   Hemoglobin 07/05/2022 13.3  13.0 - 17.0 g/dL Final   HCT 07/05/2022 37.9 (L)  39.0 - 52.0 % Final   MCV 07/05/2022 101.1 (H)  80.0 - 100.0 fL Final   MCH 07/05/2022 35.5 (H)  26.0 - 34.0 pg Final   MCHC 07/05/2022 35.1  30.0 - 36.0 g/dL Final  RDW 07/05/2022 12.4  11.5 - 15.5 % Final   Platelets 07/05/2022 199  150 - 400 K/uL Final   nRBC 07/05/2022 0.0  0.0 - 0.2 % Final   Neutrophils Relative % 07/05/2022 47  % Final   Neutro Abs 07/05/2022 3.2  1.7 - 7.7 K/uL Final   Lymphocytes Relative 07/05/2022 42  % Final   Lymphs Abs 07/05/2022 2.9  0.7 - 4.0 K/uL Final   Monocytes Relative 07/05/2022 9  % Final   Monocytes Absolute 07/05/2022 0.7  0.1 - 1.0 K/uL Final   Eosinophils Relative 07/05/2022 1  % Final   Eosinophils Absolute 07/05/2022 0.1  0.0 - 0.5 K/uL Final   Basophils Relative 07/05/2022 1  % Final   Basophils Absolute 07/05/2022 0.1  0.0 - 0.1 K/uL Final   Immature Granulocytes 07/05/2022 0  % Final   Abs Immature Granulocytes 07/05/2022 0.01  0.00 - 0.07 K/uL Final   Performed at [facility], [place] [address], [place], [place] [address]   Sodium 07/05/2022 140  135 - 145 mmol/L Final   Potassium 07/05/2022 3.7  3.5 - 5.1 mmol/L Final   Chloride 07/05/2022 102  98 - 111 mmol/L Final   CO2 07/05/2022 27  22 - 32 mmol/L Final   Glucose, Bld 07/05/2022 97  70 - 99 mg/dL Final   Glucose reference range applies only to samples taken after fasting for at least 8 hours.   BUN 07/05/2022 9  6 - 20 mg/dL Final   Creatinine, Ser 07/05/2022 1.13  0.61 - 1.24 mg/dL Final   Calcium 07/05/2022 9.3  8.9 - 10.3 mg/dL Final   Total Protein 07/05/2022 7.0  6.5 - 8.1 g/dL Final   Albumin 07/05/2022 4.1  3.5 - 5.0 g/dL Final   AST 07/05/2022 36  15 - 41 U/L Final   ALT 07/05/2022 22  0 - 44 U/L Final   Alkaline Phosphatase 07/05/2022 57  38 - 126 U/L Final   Total Bilirubin  07/05/2022 0.4  0.3 - 1.2 mg/dL Final   GFR, Estimated 07/05/2022 >60  >60 mL/min Final   Comment: (NOTE) Calculated using the CKD-EPI Creatinine Equation (2021)    Anion gap 07/05/2022 11  5 - 15 Final   Performed at [facility] [address] [place],  [address]   Hgb A1c MFr Bld 07/05/2022 4.9  4.8 - 5.6 % Final   Comment: (NOTE)         Prediabetes: 5.7 - 6.4         Diabetes: >6.4         Glycemic control for adults with diabetes: <7.0    Mean Plasma Glucose 07/05/2022 94  mg/dL Final   Comment: (NOTE) Performed At: [facility] [place], [place] [id number] Rush Farmer MD [id number]    Alcohol, Ethyl (B) 07/05/2022 <10  <10 mg/dL Final   Comment: (NOTE) Lowest detectable limit for serum alcohol is 10 mg/dL.  For medical purposes only. Performed at [facility], [place] [address], [place],  [address]    Cholesterol 07/05/2022 111  0 - 200 mg/dL Final   Triglycerides 07/05/2022 38  <150 mg/dL Final   HDL 07/05/2022 71  >40 mg/dL Final   Total CHOL/HDL Ratio 07/05/2022 1.6  RATIO Final   VLDL 07/05/2022 8  0 - 40 mg/dL Final   LDL Cholesterol 07/05/2022 32  0 - 99 mg/dL Final   Comment:  Total Cholesterol/HDL:CHD Risk Coronary Heart Disease Risk Table                     Men   Women  1/2 Average Risk   3.4   3.3  Average Risk       5.0   4.4  2 X Average Risk   9.6   7.1  3 X Average Risk  23.4   11.0        Use the calculated Patient Ratio above and the CHD Risk Table to determine the patient's CHD Risk.        ATP III CLASSIFICATION (LDL):  <100     mg/dL   Optimal  100-129  mg/dL   Near or Above                    Optimal  130-159  mg/dL   Borderline  160-189  mg/dL   High  >190     mg/dL   Very High Performed at [facility] [address], [place], [place] [address]    TSH 07/05/2022 0.914  0.350 - 4.500 uIU/mL Final   Comment: Performed by a 3rd Generation assay with a functional  sensitivity of <=0.01 uIU/mL. Performed at [facility], [place] [address] [place], [place] [address]    SARSCOV2ONAVIRUS 2 AG 07/05/2022 NEGATIVE  NEGATIVE Final   Comment: (NOTE) SARS-CoV-2 antigen NOT DETECTED.   Negative results are presumptive.  Negative results do not preclude SARS-CoV-2 infection and should not be used as the sole basis for treatment or other patient management decisions, including infection  control decisions, particularly in the presence of clinical signs and  symptoms consistent with COVID-19, or in those who have been in contact with the virus.  Negative results must be combined with clinical observations, patient history, and epidemiological information. The expected result is Negative.  Fact Sheet for Patients: HandmadeRecipes.com.cy  Fact Sheet for Healthcare Providers: FuneralLife.at  This test is not yet approved or cleared by the [place] FDA and  has been authorized for detection and/or diagnosis of SARS-CoV-2 by FDA under an Emergency Use Authorization (EUA).  This EUA will remain in effect (meaning this test can be used) for the duration of  the COV                          ID-19 declaration under Section 564(b)(1) of the Act, 21 U.S.C. section 360bbb-3(b)(1), unless the authorization is terminated or revoked sooner.    Admission on 05/16/2022, Discharged on 05/17/2022  Component Date Value Ref Range Status   Sodium 05/17/2022 135  135 - 145 mmol/L Final   Potassium 05/17/2022 3.7  3.5 - 5.1 mmol/L Final   Chloride 05/17/2022 102  98 - 111 mmol/L Final   CO2 05/17/2022 25  22 - 32 mmol/L Final   Glucose, Bld 05/17/2022 152 (H)  70 - 99 mg/dL Final   Glucose reference range applies only to samples taken after fasting for at least 8 hours.   BUN 05/17/2022 22 (H)  6 - 20 mg/dL Final   Creatinine, Ser 05/17/2022 1.41 (H)  0.61 - 1.24 mg/dL Final   Calcium 05/17/2022 9.2  8.9 - 10.3 mg/dL  Final   Total Protein 05/17/2022 8.5 (H)  6.5 - 8.1 g/dL Final   Albumin 05/17/2022 4.8  3.5 - 5.0 g/dL Final   AST 05/17/2022 47 (H)  15 - 41 U/L  Final   ALT 05/17/2022 30  0 - 44 U/L Final   Alkaline Phosphatase 05/17/2022 65  38 - 126 U/L Final   Total Bilirubin 05/17/2022 0.7  0.3 - 1.2 mg/dL Final   GFR, Estimated 05/17/2022 >60  >60 mL/min Final   Comment: (NOTE) Calculated using the CKD-EPI Creatinine Equation (2021)    Anion gap 05/17/2022 8  5 - 15 Final   Performed at [facility], [place] [address] [place], [place] [address]   Alcohol, Ethyl (B) 05/17/2022 <10  <10 mg/dL Final   Comment: (NOTE) Lowest detectable limit for serum alcohol is 10 mg/dL.  For medical purposes only. Performed at [facility], [place] [address] [place], [place] [id number]    Salicylate Lvl 99991111 <7.0 (L)  7.0 - 30.0 mg/dL Final   Performed at [facility] [address] [place], [place] [address]   Acetaminophen (Tylenol), Serum 05/17/2022 <10 (L)  10 - 30 ug/mL Final   Comment: (NOTE) Therapeutic concentrations vary significantly. A range of 10-30 ug/mL  may be an effective concentration for many patients. However, some  are best treated at concentrations outside of this range. Acetaminophen concentrations >150 ug/mL at 4 hours after ingestion  and >50 ug/mL at 12 hours after ingestion are often associated with  toxic reactions.  Performed at [facility], [place] [address] [place], [place] [address]    WBC 05/17/2022 7.2  4.0 - 10.5 K/uL Final   RBC 05/17/2022 4.33  4.22 - 5.81 MIL/uL Final   Hemoglobin 05/17/2022 14.9  13.0 - 17.0 g/dL Final   HCT 05/17/2022 44.2  39.0 - 52.0 % Final   MCV 05/17/2022 102.1 (H)  80.0 - 100.0 fL Final   MCH 05/17/2022 34.4 (H)  26.0 - 34.0 pg Final   MCHC 05/17/2022 33.7  30.0 - 36.0 g/dL Final   RDW 05/17/2022 12.4  11.5 - 15.5 % Final   Platelets 05/17/2022 191  150 - 400 K/uL  Final   nRBC 05/17/2022 0.0  0.0 - 0.2 % Final   Performed at [facility], [place] [address], [place], [place] [address]   Opiates 05/17/2022 NONE DETECTED  NONE DETECTED Final   Cocaine 05/17/2022 POSITIVE (A)  NONE DETECTED Final   Benzodiazepines 05/17/2022 NONE DETECTED  NONE DETECTED Final   Amphetamines 05/17/2022 NONE DETECTED  NONE DETECTED Final   Tetrahydrocannabinol 05/17/2022 POSITIVE (A)  NONE DETECTED Final   Barbiturates 05/17/2022 NONE DETECTED  NONE DETECTED Final   Comment: (NOTE) DRUG SCREEN FOR MEDICAL PURPOSES ONLY.  IF CONFIRMATION IS NEEDED FOR ANY PURPOSE, NOTIFY LAB WITHIN 5 DAYS.  LOWEST DETECTABLE LIMITS FOR URINE DRUG SCREEN Drug Class                     Cutoff (ng/mL) Amphetamine and metabolites    1000 Barbiturate and metabolites    200 Benzodiazepine                 200 Opiates and metabolites        300 Cocaine and metabolites        300 THC                            50 Performed at [facility], [place] [address], [place], [place] [address]     Allergies: Tylenol [acetaminophen]  Medications:  Facility Ordered Medications  Medication   [COMPLETED]  thiamine (VITAMIN B1) injection 100 mg   alum & mag hydroxide-simeth (MAALOX/MYLANTA) 200-200-20 MG/5ML suspension 30 mL   magnesium hydroxide (MILK OF MAGNESIA) suspension 30 mL   hydrOXYzine (ATARAX) tablet 25 mg   [START ON 09/23/2022] nicotine (NICODERM CQ - dosed in mg/24 hours) patch 21 mg   PTA Medications  Medication Sig   Multiple Vitamin (MULTIVITAMIN WITH MINERALS) TABS tablet Take 1 tablet by mouth daily. (Patient not taking: Reported on 09/22/2022)   thiamine (VITAMIN B1) 100 MG tablet Take 1 tablet (100 mg total) by mouth daily. (Patient not taking: Reported on 09/22/2022)    Long Term Goals: Improvement in symptoms so as ready for discharge  Short Term Goals: Patient will verbalize feelings in meetings with treatment team members., Patient will  attend at least of 50% of the groups daily., Pt will complete the PHQ9 on admission, day 3 and discharge., Patient will participate in completing the [place], Patient will score a low risk of violence for 24 hours prior to discharge, and Patient will take medications as prescribed daily.  Medical Decision Making   Status: Voluntary, patient can be discharged AMA should he request given lack of self-harm behavior  Alcohol and methamphetamine use disorders - Monitor for withdrawal symptoms, CIWA with as needed Ativan  Medical: - UDS positive for cocaine and amphetamines and THC - Lab work unremarkable    Recommendations  Based on my evaluation the patient does not appear to have an emergency medical condition.  Corky Sox, MD 09/22/22  2:54 PM

## 2022-09-22 NOTE — ED Provider Notes (Signed)
Emergency Medicine Observation Re-evaluation Note  Calvin [name] is a 37 y.o. male, seen on rounds today.  Pt initially presented to the ED for complaints of Hypoglycemia (Patient arrives via ems from home after using percocet cocaine mushrooms for past several days and not eating. Patient found  disoriented sweaty and had bgl of 53 given oral and iv glucose arrived to er room a/o x4 bgl 119) Currently, the patient is resting comfortably.  Agrees to go to behavioral health for further management of his substance abuse.  Physical Exam  BP 96/62   Pulse 70   Temp 98 F (36.7 C) (Oral)   Resp 16   Ht '6\' 1"'$  (1.854 m)   Wt 65.8 kg   SpO2 99%   BMI 19.13 kg/m  Physical Exam General: Resting comfortably in stretcher, alert and conversant Lungs: Normal work of breathing Psych: Calm  ED Course / MDM  EKG:   I have reviewed the labs performed to date as well as medications administered while in observation.  Recent changes in the last 24 hours include seen by psychiatry.  Plan  Current plan is for voluntary placement to [facility]) for further management.   Fransico Meadow, MD 09/22/22 1011

## 2022-09-22 NOTE — ED Notes (Signed)
Pt refused meal.

## 2022-09-23 DIAGNOSIS — F1410 Cocaine abuse, uncomplicated: Secondary | ICD-10-CM | POA: Diagnosis not present

## 2022-09-23 NOTE — Care Management (Addendum)
Care Management   Writer spoke to [place] at [facility] [phone]) and was able to confirm that the patient has been accepted to their program and his intake date and time at their facility is Thursday 09-25-2022 between 8-8:30am.   The address is [place] in [place].    Writer informed Dr. Merrilee Seashore and the RN working with the patient.

## 2022-09-23 NOTE — ED Notes (Signed)
Pt is in the bed sleeping. Respirations are even and unlabored. No acute distress noted. Will continue to monitor for safety. 

## 2022-09-23 NOTE — ED Notes (Signed)
Pt sleeping in no acute distress. RR even and unlabored. Environment secured. Will continue to monitor for safety. 

## 2022-09-23 NOTE — ED Notes (Signed)
Pt resting quietly, breathing is even and unlabored.  Pt denies SI, HI, pain and AVH.  Will continue to monitor for safety.

## 2022-09-23 NOTE — ED Notes (Signed)
Patient observed/assessed at bedside in patient room. Patient alert and oriented x 3. Affect is flat. Patient denies pain and anxiety. He denies A/V/H. He denies having any thoughts/plan of self harm and harm towards others. Fluid and snack offered. Patient states that appetite has been good throughout the day. Verbalizes no further complaints at this time. Will continue to monitor and support.

## 2022-09-23 NOTE — ED Notes (Signed)
Pt noted calling [facility] to determine bed availability

## 2022-09-23 NOTE — ED Provider Notes (Signed)
Behavioral Health Progress Note  Date and Time: 09/23/2022 12:17 PM Name: Calvin [name] MRN:  [id number]  Subjective:   The patient is a 37 year old male with history of alcohol use disorder, cocaine use disorder, and cannabis use disorder.  The patient was discharged from the facility based crisis 2 days ago.  He left before receiving residential rehab placement and said that he would go live with his girlfriend and get placement at [facility] treatment center on his own.  The patient was found unresponsive and was brought to the emergency department.  He was noted to be hypoglycemic by EMS and was given D50.  His care was transferred to the facility based crisis for residential rehab placement.   On assessment 3/4, the patient exhibits a linear and logical thought process.  He states the relationship with his girlfriend ended quickly after he left.  He states that he started using the same substances that he was using prior to the previous admission.  He again reports that he does not experience alcohol withdrawal symptoms and is not having any kind of withdrawal presently.  The patient denies having any medical problems aside from asthma.  He states that he has had episodes of "low blood sugar" before.  Last A1c in December was 4.9.  On assessment 3/5, the patient reports that he is doing well.  LCSW confirms that the patient will discharge on Thursday to [facility], early in the morning.  The patient denies auditory/visual hallucinations.  The patient reports good mood, appetite, and sleep. They deny suicidal and homicidal thoughts. The patient denies side effects from their medications.  Review of systems as below. The patient denies experiencing any withdrawal symptoms.    Diagnosis:  Final diagnoses:  Cocaine abuse (Anna)    Total Time spent with patient: 20 minutes  Past Psychiatric History: as above Past Medical History: as above Family History: none Family Psychiatric History:  none Social History: as above Tobacco Cessation:  A prescription for an FDA-approved tobacco cessation medication provided at discharge   Current Medications:  Current Facility-Administered Medications  Medication Dose Route Frequency Provider Last Rate Last Admin   alum & mag hydroxide-simeth (MAALOX/MYLANTA) 200-200-20 MG/5ML suspension 30 mL  30 mL Oral Q4H PRN Corky Sox, MD       hydrOXYzine (ATARAX) tablet 25 mg  25 mg Oral TID PRN Corky Sox, MD       LORazepam (ATIVAN) tablet 1 mg  1 mg Oral Q6H PRN Corky Sox, MD       magnesium hydroxide (MILK OF MAGNESIA) suspension 30 mL  30 mL Oral Daily PRN Corky Sox, MD       nicotine (NICODERM CQ - dosed in mg/24 hours) patch 21 mg  21 mg Transdermal Q0600 Corky Sox, MD       Current Outpatient Medications  Medication Sig Dispense Refill   nicotine (NICODERM CQ - DOSED IN MG/24 HOURS) 21 mg/24hr patch Place 1 patch (21 mg total) onto the skin daily at 6 (six) AM. 28 patch 0    Labs  Lab Results:  Admission on 09/22/2022, Discharged on 09/22/2022  Component Date Value Ref Range Status   WBC 09/22/2022 10.1  4.0 - 10.5 K/uL Final   RBC 09/22/2022 3.89 (L)  4.22 - 5.81 MIL/uL Final   Hemoglobin 09/22/2022 13.6  13.0 - 17.0 g/dL Final   HCT 09/22/2022 39.9  39.0 - 52.0 % Final   MCV 09/22/2022 102.6 (H)  80.0 - 100.0 fL Final   MCH  09/22/2022 35.0 (H)  26.0 - 34.0 pg Final   MCHC 09/22/2022 34.1  30.0 - 36.0 g/dL Final   RDW 09/22/2022 12.2  11.5 - 15.5 % Final   Platelets 09/22/2022 186  150 - 400 K/uL Final   nRBC 09/22/2022 0.0  0.0 - 0.2 % Final   Neutrophils Relative % 09/22/2022 83  % Final   Neutro Abs 09/22/2022 8.3 (H)  1.7 - 7.7 K/uL Final   Lymphocytes Relative 09/22/2022 12  % Final   Lymphs Abs 09/22/2022 1.2  0.7 - 4.0 K/uL Final   Monocytes Relative 09/22/2022 5  % Final   Monocytes Absolute 09/22/2022 0.5  0.1 - 1.0 K/uL Final   Eosinophils Relative 09/22/2022 0  % Final   Eosinophils  Absolute 09/22/2022 0.0  0.0 - 0.5 K/uL Final   Basophils Relative 09/22/2022 0  % Final   Basophils Absolute 09/22/2022 0.0  0.0 - 0.1 K/uL Final   Immature Granulocytes 09/22/2022 0  % Final   Abs Immature Granulocytes 09/22/2022 0.04  0.00 - 0.07 K/uL Final   Performed at [facility], [place] [address] [place], [place] [address]   Sodium 09/22/2022 135  135 - 145 mmol/L Final   Potassium 09/22/2022 4.2  3.5 - 5.1 mmol/L Final   Chloride 09/22/2022 98  98 - 111 mmol/L Final   CO2 09/22/2022 22  22 - 32 mmol/L Final   Glucose, Bld 09/22/2022 155 (H)  70 - 99 mg/dL Final   Glucose reference range applies only to samples taken after fasting for at least 8 hours.   BUN 09/22/2022 16  6 - 20 mg/dL Final   Creatinine, Ser 09/22/2022 1.23  0.61 - 1.24 mg/dL Final   Calcium 09/22/2022 8.5 (L)  8.9 - 10.3 mg/dL Final   Total Protein 09/22/2022 7.0  6.5 - 8.1 g/dL Final   Albumin 09/22/2022 4.1  3.5 - 5.0 g/dL Final   AST 09/22/2022 39  15 - 41 U/L Final   ALT 09/22/2022 20  0 - 44 U/L Final   Alkaline Phosphatase 09/22/2022 66  38 - 126 U/L Final   Total Bilirubin 09/22/2022 0.6  0.3 - 1.2 mg/dL Final   GFR, Estimated 09/22/2022 >60  >60 mL/min Final   Comment: (NOTE) Calculated using the CKD-EPI Creatinine Equation (2021)    Anion gap 09/22/2022 15  5 - 15 Final   Performed at [facility] [address], [place], [place] [address]   Alcohol, Ethyl (B) 09/22/2022 69 (H)  <10 mg/dL Final   Comment: (NOTE) Lowest detectable limit for serum alcohol is 10 mg/dL.  For medical purposes only. Performed at [facility], [place] [address], [place], [place] [address]    Opiates 09/22/2022 NONE DETECTED  NONE DETECTED Final   Cocaine 09/22/2022 POSITIVE (A)  NONE DETECTED Final   Benzodiazepines 09/22/2022 NONE DETECTED  NONE DETECTED Final   Amphetamines 09/22/2022 POSITIVE (A)  NONE DETECTED Final   Tetrahydrocannabinol 09/22/2022 POSITIVE (A)  NONE DETECTED Final    Barbiturates 09/22/2022 NONE DETECTED  NONE DETECTED Final   Comment: (NOTE) DRUG SCREEN FOR MEDICAL PURPOSES ONLY.  IF CONFIRMATION IS NEEDED FOR ANY PURPOSE, NOTIFY LAB WITHIN 5 DAYS.  LOWEST DETECTABLE LIMITS FOR URINE DRUG SCREEN Drug Class                     Cutoff (ng/mL) Amphetamine and metabolites    1000 Barbiturate and metabolites    200 Benzodiazepine  200 Opiates and metabolites        300 Cocaine and metabolites        300 THC                            50 Performed at [facility], [place] [address], [place], [place] [address]    Acetaminophen (Tylenol), Serum 09/22/2022 <10 (L)  10 - 30 ug/mL Final   Comment: (NOTE) Therapeutic concentrations vary significantly. A range of 10-30 ug/mL  may be an effective concentration for many patients. However, some  are best treated at concentrations outside of this range. Acetaminophen concentrations >150 ug/mL at 4 hours after ingestion  and >50 ug/mL at 12 hours after ingestion are often associated with  toxic reactions.  Performed at [facility], [place] [address], [place], [place] [id number]    Salicylate Lvl [id number] <7.0 (L)  7.0 - 30.0 mg/dL Final   Performed at [facility] [address] [place], [place] [address]   SARS Coronavirus 2 by RT PCR 09/22/2022 NEGATIVE  NEGATIVE Final   Influenza A by PCR 09/22/2022 NEGATIVE  NEGATIVE Final   Influenza B by PCR 09/22/2022 NEGATIVE  NEGATIVE Final   Comment: (NOTE) The Xpert Xpress SARS-CoV-2/FLU/RSV plus assay is intended as an aid in the diagnosis of influenza from Nasopharyngeal swab specimens and should not be used as a sole basis for treatment. Nasal washings and aspirates are unacceptable for Xpert Xpress SARS-CoV-2/FLU/RSV testing.  Fact Sheet for Patients: EntrepreneurPulse.com.au  Fact Sheet for Healthcare Providers: IncredibleEmployment.be  This test is not yet approved or cleared by the  [place] FDA and has been authorized for detection and/or diagnosis of SARS-CoV-2 by FDA under an Emergency Use Authorization (EUA). This EUA will remain in effect (meaning this test can be used) for the duration of the COVID-19 declaration under Section 564(b)(1) of the Act, 21 U.S.C. section 360bbb-3(b)(1), unless the authorization is terminated or revoked.     Resp Syncytial Virus by PCR 09/22/2022 NEGATIVE  NEGATIVE Final   Comment: (NOTE) Fact Sheet for Patients: EntrepreneurPulse.com.au  Fact Sheet for Healthcare Providers: IncredibleEmployment.be  This test is not yet approved or cleared by the [place] FDA and has been authorized for detection and/or diagnosis of SARS-CoV-2 by FDA under an Emergency Use Authorization (EUA). This EUA will remain in effect (meaning this test can be used) for the duration of the COVID-19 declaration under Section 564(b)(1) of the Act, 21 U.S.C. section 360bbb-3(b)(1), unless the authorization is terminated or revoked.  Performed at [facility], [place] [address], [place], [place] [address]   Admission on 09/18/2022, Discharged on 09/20/2022  Component Date Value Ref Range Status   SARS Coronavirus 2 by RT PCR 09/18/2022 NEGATIVE  NEGATIVE Final   Influenza A by PCR 09/18/2022 NEGATIVE  NEGATIVE Final   Influenza B by PCR 09/18/2022 NEGATIVE  NEGATIVE Final   Comment: (NOTE) The Xpert Xpress SARS-CoV-2/FLU/RSV plus assay is intended as an aid in the diagnosis of influenza from Nasopharyngeal swab specimens and should not be used as a sole basis for treatment. Nasal washings and aspirates are unacceptable for Xpert Xpress SARS-CoV-2/FLU/RSV testing.  Fact Sheet for Patients: EntrepreneurPulse.com.au  Fact Sheet for Healthcare Providers: IncredibleEmployment.be  This test is not yet approved or cleared by the [place] FDA and has been authorized  for detection and/or diagnosis of SARS-CoV-2 by FDA under an Emergency Use Authorization (EUA). This  EUA will remain in effect (meaning this test can be used) for the duration of the COVID-19 declaration under Section 564(b)(1) of the Act, 21 U.S.C. section 360bbb-3(b)(1), unless the authorization is terminated or revoked.     Resp Syncytial Virus by PCR 09/18/2022 NEGATIVE  NEGATIVE Final   Comment: (NOTE) Fact Sheet for Patients: EntrepreneurPulse.com.au  Fact Sheet for Healthcare Providers: IncredibleEmployment.be  This test is not yet approved or cleared by the [place] FDA and has been authorized for detection and/or diagnosis of SARS-CoV-2 by FDA under an Emergency Use Authorization (EUA). This EUA will remain in effect (meaning this test can be used) for the duration of the COVID-19 declaration under Section 564(b)(1) of the Act, 21 U.S.C. section 360bbb-3(b)(1), unless the authorization is terminated or revoked.  Performed at [facility], [place] [address], [place], [place] [address]    WBC 09/18/2022 6.7  4.0 - 10.5 K/uL Final   RBC 09/18/2022 4.42  4.22 - 5.81 MIL/uL Final   Hemoglobin 09/18/2022 15.4  13.0 - 17.0 g/dL Final   HCT 09/18/2022 44.6  39.0 - 52.0 % Final   MCV 09/18/2022 100.9 (H)  80.0 - 100.0 fL Final   MCH 09/18/2022 34.8 (H)  26.0 - 34.0 pg Final   MCHC 09/18/2022 34.5  30.0 - 36.0 g/dL Final   RDW 09/18/2022 11.9  11.5 - 15.5 % Final   Platelets 09/18/2022 219  150 - 400 K/uL Final   nRBC 09/18/2022 0.0  0.0 - 0.2 % Final   Neutrophils Relative % 09/18/2022 53  % Final   Neutro Abs 09/18/2022 3.5  1.7 - 7.7 K/uL Final   Lymphocytes Relative 09/18/2022 39  % Final   Lymphs Abs 09/18/2022 2.6  0.7 - 4.0 K/uL Final   Monocytes Relative 09/18/2022 7  % Final   Monocytes Absolute 09/18/2022 0.5  0.1 - 1.0 K/uL Final   Eosinophils Relative 09/18/2022 1  % Final   Eosinophils Absolute 09/18/2022 0.1  0.0 -  0.5 K/uL Final   Basophils Relative 09/18/2022 0  % Final   Basophils Absolute 09/18/2022 0.0  0.0 - 0.1 K/uL Final   Immature Granulocytes 09/18/2022 0  % Final   Abs Immature Granulocytes 09/18/2022 0.01  0.00 - 0.07 K/uL Final   Performed at [facility], [place] [address] [place], [place] [address]   Sodium 09/18/2022 139  135 - 145 mmol/L Final   Potassium 09/18/2022 4.0  3.5 - 5.1 mmol/L Final   Chloride 09/18/2022 99  98 - 111 mmol/L Final   CO2 09/18/2022 27  22 - 32 mmol/L Final   Glucose, Bld 09/18/2022 62 (L)  70 - 99 mg/dL Final   Glucose reference range applies only to samples taken after fasting for at least 8 hours.   BUN 09/18/2022 9  6 - 20 mg/dL Final   Creatinine, Ser 09/18/2022 0.97  0.61 - 1.24 mg/dL Final   Calcium 09/18/2022 9.4  8.9 - 10.3 mg/dL Final   Total Protein 09/18/2022 8.2 (H)  6.5 - 8.1 g/dL Final   Albumin 09/18/2022 4.4  3.5 - 5.0 g/dL Final   AST 09/18/2022 34  15 - 41 U/L Final   ALT 09/18/2022 23  0 - 44 U/L Final   Alkaline Phosphatase 09/18/2022 67  38 - 126 U/L Final   Total Bilirubin 09/18/2022 0.6  0.3 - 1.2 mg/dL Final   GFR, Estimated 09/18/2022 >60  >60 mL/min Final   Comment: (NOTE) Calculated using the CKD-EPI  Creatinine Equation (2021)    Anion gap 09/18/2022 13  5 - 15 Final   Performed at [facility], [place] [address] [place], [place] [address]   Hgb A1c MFr Bld 09/18/2022 4.9  4.8 - 5.6 % Final   Comment: (NOTE)         Prediabetes: 5.7 - 6.4         Diabetes: >6.4         Glycemic control for adults with diabetes: <7.0    Mean Plasma Glucose 09/18/2022 94  mg/dL Final   Comment: (NOTE) Performed At: [facility] [place], [place] [id number] Rush Farmer MD [id number]    Cholesterol 09/18/2022 135  0 - 200 mg/dL Final   Triglycerides 09/18/2022 72  <150 mg/dL Final   HDL 09/18/2022 72  >40 mg/dL Final   Total CHOL/HDL Ratio 09/18/2022 1.9  RATIO Final   VLDL 09/18/2022 14  0 - 40 mg/dL  Final   LDL Cholesterol 09/18/2022 49  0 - 99 mg/dL Final   Comment:        Total Cholesterol/HDL:CHD Risk Coronary Heart Disease Risk Table                     Men   Women  1/2 Average Risk   3.4   3.3  Average Risk       5.0   4.4  2 X Average Risk   9.6   7.1  3 X Average Risk  23.4   11.0        Use the calculated Patient Ratio above and the CHD Risk Table to determine the patient's CHD Risk.        ATP III CLASSIFICATION (LDL):  <100     mg/dL   Optimal  100-129  mg/dL   Near or Above                    Optimal  130-159  mg/dL   Borderline  160-189  mg/dL   High  >190     mg/dL   Very High Performed at [facility] [address], [place], [place] [address]    TSH 09/18/2022 1.267  0.350 - 4.500 uIU/mL Final   Comment: Performed by a 3rd Generation assay with a functional sensitivity of <=0.01 uIU/mL. Performed at [facility], [place] [address], [place], [place] [address]    POC Amphetamine UR 09/18/2022 None Detected  NONE DETECTED (Cut Off Level 1000 ng/mL) Preliminary   POC Secobarbital (BAR) 09/18/2022 None Detected  NONE DETECTED (Cut Off Level 300 ng/mL) Preliminary   POC Buprenorphine (BUP) 09/18/2022 None Detected  NONE DETECTED (Cut Off Level 10 ng/mL) Preliminary   POC Oxazepam (BZO) 09/18/2022 None Detected  NONE DETECTED (Cut Off Level 300 ng/mL) Preliminary   POC Cocaine UR 09/18/2022 None Detected  NONE DETECTED (Cut Off Level 300 ng/mL) Preliminary   POC Methamphetamine UR 09/18/2022 Positive (A)  NONE DETECTED (Cut Off Level 1000 ng/mL) Preliminary   POC Morphine 09/18/2022 None Detected  NONE DETECTED (Cut Off Level 300 ng/mL) Preliminary   POC Methadone UR 09/18/2022 None Detected  NONE DETECTED (Cut Off Level 300 ng/mL) Preliminary   POC Oxycodone UR 09/18/2022 Positive (A)  NONE DETECTED (Cut Off Level 100 ng/mL) Preliminary   POC Marijuana UR 09/18/2022 Positive (A)  NONE DETECTED (Cut Off Level 50 ng/mL) Preliminary   SARSCOV2ONAVIRUS 2 AG  09/18/2022 NEGATIVE  NEGATIVE Final  Comment: (NOTE) SARS-CoV-2 antigen NOT DETECTED.   Negative results are presumptive.  Negative results do not preclude SARS-CoV-2 infection and should not be used as the sole basis for treatment or other patient management decisions, including infection  control decisions, particularly in the presence of clinical signs and  symptoms consistent with COVID-19, or in those who have been in contact with the virus.  Negative results must be combined with clinical observations, patient history, and epidemiological information. The expected result is Negative.  Fact Sheet for Patients: HandmadeRecipes.com.cy  Fact Sheet for Healthcare Providers: FuneralLife.at  This test is not yet approved or cleared by the [place] FDA and  has been authorized for detection and/or diagnosis of SARS-CoV-2 by FDA under an Emergency Use Authorization (EUA).  This EUA will remain in effect (meaning this test can be used) for the duration of  the COV                          ID-19 declaration under Section 564(b)(1) of the Act, 21 U.S.C. section 360bbb-3(b)(1), unless the authorization is terminated or revoked sooner.    Admission on 08/05/2022, Discharged on 08/06/2022  Component Date Value Ref Range Status   Lipase 08/05/2022 49  11 - 51 U/L Final   Performed at [facility] [address], [place], [place] [address]   Sodium 08/05/2022 136  135 - 145 mmol/L Final   Potassium 08/05/2022 3.9  3.5 - 5.1 mmol/L Final   Chloride 08/05/2022 102  98 - 111 mmol/L Final   CO2 08/05/2022 26  22 - 32 mmol/L Final   Glucose, Bld 08/05/2022 145 (H)  70 - 99 mg/dL Final   Glucose reference range applies only to samples taken after fasting for at least 8 hours.   BUN 08/05/2022 8  6 - 20 mg/dL Final   Creatinine, Ser 08/05/2022 1.01  0.61 - 1.24 mg/dL Final   Calcium 08/05/2022 9.0  8.9 - 10.3 mg/dL Final   Total  Protein 08/05/2022 7.1  6.5 - 8.1 g/dL Final   Albumin 08/05/2022 3.9  3.5 - 5.0 g/dL Final   AST 08/05/2022 27  15 - 41 U/L Final   ALT 08/05/2022 15  0 - 44 U/L Final   Alkaline Phosphatase 08/05/2022 64  38 - 126 U/L Final   Total Bilirubin 08/05/2022 0.5  0.3 - 1.2 mg/dL Final   GFR, Estimated 08/05/2022 >60  >60 mL/min Final   Comment: (NOTE) Calculated using the CKD-EPI Creatinine Equation (2021)    Anion gap 08/05/2022 8  5 - 15 Final   Performed at [facility], [place] [address], [place], [place] [address]   WBC 08/05/2022 6.7  4.0 - 10.5 K/uL Final   RBC 08/05/2022 4.25  4.22 - 5.81 MIL/uL Final   Hemoglobin 08/05/2022 14.7  13.0 - 17.0 g/dL Final   HCT 08/05/2022 44.2  39.0 - 52.0 % Final   MCV 08/05/2022 104.0 (H)  80.0 - 100.0 fL Final   MCH 08/05/2022 34.6 (H)  26.0 - 34.0 pg Final   MCHC 08/05/2022 33.3  30.0 - 36.0 g/dL Final   RDW 08/05/2022 11.9  11.5 - 15.5 % Final   Platelets 08/05/2022 209  150 - 400 K/uL Final   nRBC 08/05/2022 0.0  0.0 - 0.2 % Final   Performed at [facility] [address], [place], [place] [address]   Color, Urine 08/05/2022 YELLOW  YELLOW Final  APPearance 08/05/2022 CLEAR  CLEAR Final   Specific Gravity, Urine 08/05/2022 1.025  1.005 - 1.030 Final   pH 08/05/2022 6.0  5.0 - 8.0 Final   Glucose, UA 08/05/2022 50 (A)  NEGATIVE mg/dL Final   Hgb urine dipstick 08/05/2022 SMALL (A)  NEGATIVE Final   Bilirubin Urine 08/05/2022 NEGATIVE  NEGATIVE Final   Ketones, ur 08/05/2022 NEGATIVE  NEGATIVE mg/dL Final   Protein, ur 08/05/2022 30 (A)  NEGATIVE mg/dL Final   Nitrite 08/05/2022 NEGATIVE  NEGATIVE Final   Leukocytes,Ua 08/05/2022 SMALL (A)  NEGATIVE Final   RBC / HPF 08/05/2022 6-10  0 - 5 RBC/hpf Final   WBC, UA 08/05/2022 11-20  0 - 5 WBC/hpf Final   Bacteria, UA 08/05/2022 RARE (A)  NONE SEEN Final   Squamous Epithelial / HPF 08/05/2022 0-5  0 - 5 /HPF Final   Mucus 08/05/2022 PRESENT   Final   Hyaline Casts, UA 08/05/2022  PRESENT   Final   Performed at [facility], [place] [address], [place], [place] [address]  Admission on 07/05/2022, Discharged on 07/06/2022  Component Date Value Ref Range Status   SARS Coronavirus 2 by RT PCR 07/05/2022 NEGATIVE  NEGATIVE Final   Comment: (NOTE) SARS-CoV-2 target nucleic acids are NOT DETECTED.  The SARS-CoV-2 RNA is generally detectable in upper respiratory specimens during the acute phase of infection. The lowest concentration of SARS-CoV-2 viral copies this assay can detect is 138 copies/mL. A negative result does not preclude SARS-Cov-2 infection and should not be used as the sole basis for treatment or other patient management decisions. A negative result may occur with  improper specimen collection/handling, submission of specimen other than nasopharyngeal swab, presence of viral mutation(s) within the areas targeted by this assay, and inadequate number of viral copies(<138 copies/mL). A negative result must be combined with clinical observations, patient history, and epidemiological information. The expected result is Negative.  Fact Sheet for Patients:  EntrepreneurPulse.com.au  Fact Sheet for Healthcare Providers:  IncredibleEmployment.be  This test is no                          t yet approved or cleared by the [place] FDA and  has been authorized for detection and/or diagnosis of SARS-CoV-2 by FDA under an Emergency Use Authorization (EUA). This EUA will remain  in effect (meaning this test can be used) for the duration of the COVID-19 declaration under Section 564(b)(1) of the Act, 21 U.S.C.section 360bbb-3(b)(1), unless the authorization is terminated  or revoked sooner.       Influenza A by PCR 07/05/2022 NEGATIVE  NEGATIVE Final   Influenza B by PCR 07/05/2022 NEGATIVE  NEGATIVE Final   Comment: (NOTE) The Xpert Xpress SARS-CoV-2/FLU/RSV plus assay is intended as an aid in the diagnosis of  influenza from Nasopharyngeal swab specimens and should not be used as a sole basis for treatment. Nasal washings and aspirates are unacceptable for Xpert Xpress SARS-CoV-2/FLU/RSV testing.  Fact Sheet for Patients: EntrepreneurPulse.com.au  Fact Sheet for Healthcare Providers: IncredibleEmployment.be  This test is not yet approved or cleared by the [place] FDA and has been authorized for detection and/or diagnosis of SARS-CoV-2 by FDA under an Emergency Use Authorization (EUA). This EUA will remain in effect (meaning this test can be used) for the duration of the COVID-19 declaration under Section 564(b)(1) of the Act, 21 U.S.C. section 360bbb-3(b)(1), unless the authorization is terminated or revoked.     Resp Syncytial  Virus by PCR 07/05/2022 NEGATIVE  NEGATIVE Final   Comment: (NOTE) Fact Sheet for Patients: EntrepreneurPulse.com.au  Fact Sheet for Healthcare Providers: IncredibleEmployment.be  This test is not yet approved or cleared by the [place] FDA and has been authorized for detection and/or diagnosis of SARS-CoV-2 by FDA under an Emergency Use Authorization (EUA). This EUA will remain in effect (meaning this test can be used) for the duration of the COVID-19 declaration under Section 564(b)(1) of the Act, 21 U.S.C. section 360bbb-3(b)(1), unless the authorization is terminated or revoked.  Performed at [facility], [place] [address], [place], [place] [address]    WBC 07/05/2022 6.9  4.0 - 10.5 K/uL Final   RBC 07/05/2022 3.75 (L)  4.22 - 5.81 MIL/uL Final   Hemoglobin 07/05/2022 13.3  13.0 - 17.0 g/dL Final   HCT 07/05/2022 37.9 (L)  39.0 - 52.0 % Final   MCV 07/05/2022 101.1 (H)  80.0 - 100.0 fL Final   MCH 07/05/2022 35.5 (H)  26.0 - 34.0 pg Final   MCHC 07/05/2022 35.1  30.0 - 36.0 g/dL Final   RDW 07/05/2022 12.4  11.5 - 15.5 % Final   Platelets 07/05/2022 199  150 - 400  K/uL Final   nRBC 07/05/2022 0.0  0.0 - 0.2 % Final   Neutrophils Relative % 07/05/2022 47  % Final   Neutro Abs 07/05/2022 3.2  1.7 - 7.7 K/uL Final   Lymphocytes Relative 07/05/2022 42  % Final   Lymphs Abs 07/05/2022 2.9  0.7 - 4.0 K/uL Final   Monocytes Relative 07/05/2022 9  % Final   Monocytes Absolute 07/05/2022 0.7  0.1 - 1.0 K/uL Final   Eosinophils Relative 07/05/2022 1  % Final   Eosinophils Absolute 07/05/2022 0.1  0.0 - 0.5 K/uL Final   Basophils Relative 07/05/2022 1  % Final   Basophils Absolute 07/05/2022 0.1  0.0 - 0.1 K/uL Final   Immature Granulocytes 07/05/2022 0  % Final   Abs Immature Granulocytes 07/05/2022 0.01  0.00 - 0.07 K/uL Final   Performed at [facility], [place] [address] [place], [place] [address]   Sodium 07/05/2022 140  135 - 145 mmol/L Final   Potassium 07/05/2022 3.7  3.5 - 5.1 mmol/L Final   Chloride 07/05/2022 102  98 - 111 mmol/L Final   CO2 07/05/2022 27  22 - 32 mmol/L Final   Glucose, Bld 07/05/2022 97  70 - 99 mg/dL Final   Glucose reference range applies only to samples taken after fasting for at least 8 hours.   BUN 07/05/2022 9  6 - 20 mg/dL Final   Creatinine, Ser 07/05/2022 1.13  0.61 - 1.24 mg/dL Final   Calcium 07/05/2022 9.3  8.9 - 10.3 mg/dL Final   Total Protein 07/05/2022 7.0  6.5 - 8.1 g/dL Final   Albumin 07/05/2022 4.1  3.5 - 5.0 g/dL Final   AST 07/05/2022 36  15 - 41 U/L Final   ALT 07/05/2022 22  0 - 44 U/L Final   Alkaline Phosphatase 07/05/2022 57  38 - 126 U/L Final   Total Bilirubin 07/05/2022 0.4  0.3 - 1.2 mg/dL Final   GFR, Estimated 07/05/2022 >60  >60 mL/min Final   Comment: (NOTE) Calculated using the CKD-EPI Creatinine Equation (2021)    Anion gap 07/05/2022 11  5 - 15 Final   Performed at [facility] [address], [place], [place] [address]   Hgb A1c MFr Bld 07/05/2022 4.9  4.8 -  5.6 % Final   Comment: (NOTE)         Prediabetes: 5.7 - 6.4         Diabetes: >6.4         Glycemic control for  adults with diabetes: <7.0    Mean Plasma Glucose 07/05/2022 94  mg/dL Final   Comment: (NOTE) Performed At: [facility] [place], [place] [id number] Rush Farmer MD RW:[id number]    Alcohol, Ethyl (B) 07/05/2022 <10  <10 mg/dL Final   Comment: (NOTE) Lowest detectable limit for serum alcohol is 10 mg/dL.  For medical purposes only. Performed at [facility], [place] [address], [place], [place] [address]    Cholesterol 07/05/2022 111  0 - 200 mg/dL Final   Triglycerides 07/05/2022 38  <150 mg/dL Final   HDL 07/05/2022 71  >40 mg/dL Final   Total CHOL/HDL Ratio 07/05/2022 1.6  RATIO Final   VLDL 07/05/2022 8  0 - 40 mg/dL Final   LDL Cholesterol 07/05/2022 32  0 - 99 mg/dL Final   Comment:        Total Cholesterol/HDL:CHD Risk Coronary Heart Disease Risk Table                     Men   Women  1/2 Average Risk   3.4   3.3  Average Risk       5.0   4.4  2 X Average Risk   9.6   7.1  3 X Average Risk  23.4   11.0        Use the calculated Patient Ratio above and the CHD Risk Table to determine the patient's CHD Risk.        ATP III CLASSIFICATION (LDL):  <100     mg/dL   Optimal  100-129  mg/dL   Near or Above                    Optimal  130-159  mg/dL   Borderline  160-189  mg/dL   High  >190     mg/dL   Very High Performed at [facility] [address], [place], [place] [address]    TSH 07/05/2022 0.914  0.350 - 4.500 uIU/mL Final   Comment: Performed by a 3rd Generation assay with a functional sensitivity of <=0.01 uIU/mL. Performed at [facility], [place] [address], [place], [place] [address]    SARSCOV2ONAVIRUS 2 AG 07/05/2022 NEGATIVE  NEGATIVE Final   Comment: (NOTE) SARS-CoV-2 antigen NOT DETECTED.   Negative results are presumptive.  Negative results do not preclude SARS-CoV-2 infection and should not be used as the sole basis for treatment or other patient management decisions, including infection  control decisions,  particularly in the presence of clinical signs and  symptoms consistent with COVID-19, or in those who have been in contact with the virus.  Negative results must be combined with clinical observations, patient history, and epidemiological information. The expected result is Negative.  Fact Sheet for Patients: HandmadeRecipes.com.cy  Fact Sheet for Healthcare Providers: FuneralLife.at  This test is not yet approved or cleared by the [place] FDA and  has been authorized for detection and/or diagnosis of SARS-CoV-2 by FDA under an Emergency Use Authorization (EUA).  This EUA will remain in effect (meaning this test can be used) for the duration of  the COV  ID-19 declaration under Section 564(b)(1) of the Act, 21 U.S.C. section 360bbb-3(b)(1), unless the authorization is terminated or revoked sooner.    Admission on 05/16/2022, Discharged on 05/17/2022  Component Date Value Ref Range Status   Sodium 05/17/2022 135  135 - 145 mmol/L Final   Potassium 05/17/2022 3.7  3.5 - 5.1 mmol/L Final   Chloride 05/17/2022 102  98 - 111 mmol/L Final   CO2 05/17/2022 25  22 - 32 mmol/L Final   Glucose, Bld 05/17/2022 152 (H)  70 - 99 mg/dL Final   Glucose reference range applies only to samples taken after fasting for at least 8 hours.   BUN 05/17/2022 22 (H)  6 - 20 mg/dL Final   Creatinine, Ser 05/17/2022 1.41 (H)  0.61 - 1.24 mg/dL Final   Calcium 05/17/2022 9.2  8.9 - 10.3 mg/dL Final   Total Protein 05/17/2022 8.5 (H)  6.5 - 8.1 g/dL Final   Albumin 05/17/2022 4.8  3.5 - 5.0 g/dL Final   AST 05/17/2022 47 (H)  15 - 41 U/L Final   ALT 05/17/2022 30  0 - 44 U/L Final   Alkaline Phosphatase 05/17/2022 65  38 - 126 U/L Final   Total Bilirubin 05/17/2022 0.7  0.3 - 1.2 mg/dL Final   GFR, Estimated 05/17/2022 >60  >60 mL/min Final   Comment: (NOTE) Calculated using the CKD-EPI Creatinine Equation (2021)    Anion gap  05/17/2022 8  5 - 15 Final   Performed at [facility], [place] [address], [place], [place] [address]   Alcohol, Ethyl (B) 05/17/2022 <10  <10 mg/dL Final   Comment: (NOTE) Lowest detectable limit for serum alcohol is 10 mg/dL.  For medical purposes only. Performed at [facility], [place] [address], [place], [place] [id number]    Salicylate Lvl 99991111 <7.0 (L)  7.0 - 30.0 mg/dL Final   Performed at [facility] [address], [place], [place] [address]   Acetaminophen (Tylenol), Serum 05/17/2022 <10 (L)  10 - 30 ug/mL Final   Comment: (NOTE) Therapeutic concentrations vary significantly. A range of 10-30 ug/mL  may be an effective concentration for many patients. However, some  are best treated at concentrations outside of this range. Acetaminophen concentrations >150 ug/mL at 4 hours after ingestion  and >50 ug/mL at 12 hours after ingestion are often associated with  toxic reactions.  Performed at [facility], [place] [address], [place], [place] [address]    WBC 05/17/2022 7.2  4.0 - 10.5 K/uL Final   RBC 05/17/2022 4.33  4.22 - 5.81 MIL/uL Final   Hemoglobin 05/17/2022 14.9  13.0 - 17.0 g/dL Final   HCT 05/17/2022 44.2  39.0 - 52.0 % Final   MCV 05/17/2022 102.1 (H)  80.0 - 100.0 fL Final   MCH 05/17/2022 34.4 (H)  26.0 - 34.0 pg Final   MCHC 05/17/2022 33.7  30.0 - 36.0 g/dL Final   RDW 05/17/2022 12.4  11.5 - 15.5 % Final   Platelets 05/17/2022 191  150 - 400 K/uL Final   nRBC 05/17/2022 0.0  0.0 - 0.2 % Final   Performed at [facility], [place] [address] [place], [place] [address]   Opiates 05/17/2022 NONE DETECTED  NONE DETECTED Final   Cocaine 05/17/2022 POSITIVE (A)  NONE DETECTED Final   Benzodiazepines 05/17/2022 NONE DETECTED  NONE DETECTED Final   Amphetamines 05/17/2022 NONE DETECTED  NONE DETECTED Final   Tetrahydrocannabinol 05/17/2022 POSITIVE (A)  NONE  DETECTED Final    Barbiturates 05/17/2022 NONE DETECTED  NONE DETECTED Final   Comment: (NOTE) DRUG SCREEN FOR MEDICAL PURPOSES ONLY.  IF CONFIRMATION IS NEEDED FOR ANY PURPOSE, NOTIFY LAB WITHIN 5 DAYS.  LOWEST DETECTABLE LIMITS FOR URINE DRUG SCREEN Drug Class                     Cutoff (ng/mL) Amphetamine and metabolites    1000 Barbiturate and metabolites    200 Benzodiazepine                 200 Opiates and metabolites        300 Cocaine and metabolites        300 THC                            50 Performed at [facility], [place] [address] [place], [place] [address]     Blood Alcohol level:  Lab Results  Component Value Date   ETH 69 (H) 09/22/2022   ETH <10 [id number]    Metabolic Disorder Labs: Lab Results  Component Value Date   HGBA1C 4.9 09/18/2022   MPG 94 09/18/2022   MPG 94 07/05/2022   No results found for: "PROLACTIN" Lab Results  Component Value Date   CHOL 135 09/18/2022   TRIG 72 09/18/2022   HDL 72 09/18/2022   CHOLHDL 1.9 09/18/2022   VLDL 14 09/18/2022   LDLCALC 49 09/18/2022   LDLCALC 32 07/05/2022    Therapeutic Lab Levels: No results found for: "LITHIUM" No results found for: "VALPROATE" No results found for: "CBMZ"  Physical Findings   PHQ2-9    Flowsheet Row ED from 09/22/2022 in [facility] ED from 09/18/2022 in [facility] ED from 11/05/2021 in [facility]  PHQ-2 Total Score '2 3 2  '$ PHQ-9 Total Score '10 12 13      '$ [facility] ED from 09/22/2022 in [facility] Most recent reading at 09/22/2022  2:08 PM ED from 09/22/2022 in [facility] at [facility] Most recent reading at 09/22/2022  2:07 AM ED from 09/18/2022 in [facility] Most recent reading at 09/18/2022 10:43 PM  C-SSRS RISK CATEGORY No Risk No Risk No Risk        Musculoskeletal  Strength & Muscle Tone:  within normal limits Gait & Station: normal Patient leans: N/A  Psychiatric Specialty Exam  Presentation General Appearance: Appropriate for Environment  Eye Contact:Fair  Speech:Clear and Coherent  Speech Volume:Normal  Handedness:-- (not assessed)   Mood and Affect  Mood:Euthymic  Affect:Congruent   Thought Process  Thought Processes:Coherent; Linear  Descriptions of Associations:Intact  Orientation:Full (Time, Place and Person)  Thought Content:Logical    Hallucinations:Hallucinations: None  Ideas of Reference:None  Suicidal Thoughts:Suicidal Thoughts: No  Homicidal Thoughts:Homicidal Thoughts: No   Sensorium  Memory:Immediate Fair; Recent Fair; Remote Fair  Judgment:Fair  Insight:Fair   Executive Functions  Concentration:Fair  Attention Span:Fair  [place]   Psychomotor Activity  Psychomotor Activity:Psychomotor Activity: Normal   Assets  Assets:Communication Skills; Resilience   Sleep  Sleep:Sleep: Fair   Nutritional Assessment (For OBS and FBC admissions only) Has the patient had a weight loss or gain of 10 pounds or more in the last 3 months?: No Has the patient had a decrease in food intake/or appetite?: Yes Does  the patient have dental problems?: No Does the patient have eating habits or behaviors that may be indicators of an eating disorder including binging or inducing vomiting?: No Has the patient recently lost weight without trying?: 0 Has the patient been eating poorly because of a decreased appetite?: 0 Malnutrition Screening Tool Score: 0    Physical Exam Constitutional:      Appearance: the patient is not toxic-appearing.  Pulmonary:     Effort: Pulmonary effort is normal.  Neurological:     General: No focal deficit present.     Mental Status: the patient is alert and oriented to person, place, and time.   Review of Systems  Respiratory:  Negative for shortness of  breath.   Cardiovascular:  Negative for chest pain.  Gastrointestinal:  Negative for abdominal pain, constipation, diarrhea, nausea and vomiting.  Neurological:  Negative for headaches.    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

## 2022-09-24 DIAGNOSIS — F1410 Cocaine abuse, uncomplicated: Secondary | ICD-10-CM | POA: Diagnosis not present

## 2022-09-24 NOTE — ED Notes (Signed)
Patient is presently asleep in bed without issue or complaint.  Will monitor and provide support as needed.

## 2022-09-24 NOTE — Discharge Instructions (Addendum)
[facility]: Address: [address] [place], [place] [address]; Phone: [phone]  List of Residential placements:   [facility]: [place]: [place], [place]: Male and male facility; 30-day program: (uninsured and Medicaid such as Deloria Lair, [place], [place], partners)  [place]: [phone]; men and women's facility; 28 days; Can have Medicaid tailored plan Publishing rights manager or Partners)  [place]: [phone] Janace Hoard or [facility]ani Hawking; 28 day program; must be fully detox; tailored Medicaid or no insurance  [facility] in [place], [place]; [phone]; 28 day all males program; no insurance accepted  [facility] Referral in [place]: Wille Glaser [phone] (no insurance or Medicaid only); 90 days; outpatient services but provide housing in apartments downtown [place] Admission: [phone]: Patient must complete phone screening for placement: [facility], [place]; 6 month program; uninsured, Medicaid, and [employer].   [facility]: no insurance required; [place]: [phone]; Intake: Herbie Baltimore; Must fill out application online; Christy Sartorius Delay [phone] x [facility] in [place], [place]: [phone]; Admissions Coordinators Mr. Simona Huh or Jene Every; 90 day program.  [facility]: [place], [place] [phone]; Co-Ed 9 month to a year program; Online application; Men entry fee is $500 (6-77month);  [employer] [address] [id number] no fee or insurance required; minimum of 2 years; Highly structured; work based; Intake Coordinator is CGerald [phone] [facility] [place] [phone] Fax number is [phone] website: [email or web address].[facility].org; Requires 3-6 page autobiography; 2 year program (18 months and then 643monthransitional housing); Admission fee is $300; no insurance needed; work  prFacilities managern SnPatriotNCAl[place]FrNorthroptaff: ReMichel [phone] have a Men's Regenerations Program 6-17m76monthFree program; There is an initial $300 fee however, they are willing to work with patients regarding that. Application is online.  [facility] [address] [place]; Any 7-90 day program is out of pocket; 12 month program is free of charge; there is a $275 entry fee; Patient is responsible for own transportation      [facility]1[place] Washington[phone] phone  New Patient Assessment/Therapy Walk-Ins:  Monday and Wednesday: 8 am until slots are full. Every 1st and 2nd Fridays of the month: 1 pm - 5 pm.  NO ASSESSMENT/THERAPY WALK-INS ON TUECraneew Patient Assessment/Medication Management Walk-Ins:  Monday - Friday:  8 am - 11 am.  For all walk-ins, we ask that you arrive by 7:30 am because patients will be seen in the order of arrival.  Availability is limited; therefore, you may not be seen on the same day that you walk-in.  Our goal is to serve and meet the needs of our community to the best of our ability.  SUBSTANCE USE TREATMENT for Medicaid and State Funded/IPRS  [facility]) [phone] phone NOTE: [facility] is no longer offering IOP services.  Serves those who are low-income or have no insurance.  [facility] [address] StreetigCrystal FallsC,[phone] NOTE: Does have Substance Abuse-Intensive Outpatient Program ([facility] well as transitional housing if eligible.  RHAS[phone] phone [phone]  DayRanchos [place] [phone] Hig[phone] phone [phone]  HALFWAY HOUSES:  [facility] of [phone]  www.oxfordvacancies.com  12 STEP PROGRAMS:  Alcoholics Anonymous of [place] ReportZoo.com.cy  Narcotics Anonymous of [place] GreenScrapbooking.dk  Al-Anon of [employer], [place] www.[place]alanon.org/find-meetings.html  Nar-Anon https://nar-anon.org/find-[email or web address]-meetin

## 2022-09-24 NOTE — ED Provider Notes (Signed)
Behavioral Health Progress Note  Date and Time: 09/24/2022 1:57 PM Name: Calvin [name] MRN:  [id number]  Subjective:   The patient is a 37 year old male with history of alcohol use disorder, cocaine use disorder, and cannabis use disorder.  The patient was discharged from the facility based crisis 2 days ago.  He left before receiving residential rehab placement and said that he would go live with his girlfriend and get placement at [facility] treatment center on his own.  The patient was found unresponsive and was brought to the emergency department.  He was noted to be hypoglycemic by EMS and was given D50.  His care was transferred to the facility based crisis for residential rehab placement.   On assessment 3/4, the patient exhibits a linear and logical thought process.  He states the relationship with his girlfriend ended quickly after he left.  He states that he started using the same substances that he was using prior to the previous admission.  He again reports that he does not experience alcohol withdrawal symptoms and is not having any kind of withdrawal presently.  The patient denies having any medical problems aside from asthma.  He states that he has had episodes of "low blood sugar" before.  Last A1c in December was 4.9.  On assessment 3/5, the patient reports that he is doing well.  LCSW confirms that the patient will discharge on Thursday to [facility], early in the morning.  The patient denies auditory/visual hallucinations.  The patient reports good mood, appetite, and sleep. They deny suicidal and homicidal thoughts. The patient denies side effects from their medications.  Review of systems as below. The patient denies experiencing any withdrawal symptoms.   On assessment 3/6, the patient reports that he is doing well and has no complaints.  He is ready to go to [facility] treatment center tomorrow.  The patient denies auditory/visual hallucinations.  The patient reports good mood,  appetite, and sleep. They deny suicidal and homicidal thoughts. The patient denies side effects from their medications.  Review of systems as below. The patient denies experiencing any withdrawal symptoms.    Diagnosis:  Final diagnoses:  Cocaine abuse ([place])    Total Time spent with patient: 20 minutes  Past Psychiatric History: as above Past Medical History: as above Family History: none Family Psychiatric History: none Social History: as above Tobacco Cessation:  A prescription for an FDA-approved tobacco cessation medication provided at discharge   Current Medications:  Current Facility-Administered Medications  Medication Dose Route Frequency Provider Last Rate Last Admin   alum & mag hydroxide-simeth (MAALOX/MYLANTA) 200-200-20 MG/5ML suspension 30 mL  30 mL Oral Q4H PRN Corky Sox, MD       hydrOXYzine (ATARAX) tablet 25 mg  25 mg Oral TID PRN Corky Sox, MD       LORazepam (ATIVAN) tablet 1 mg  1 mg Oral Q6H PRN Corky Sox, MD       magnesium hydroxide (MILK OF MAGNESIA) suspension 30 mL  30 mL Oral Daily PRN Corky Sox, MD       nicotine (NICODERM CQ - dosed in mg/24 hours) patch 21 mg  21 mg Transdermal Q0600 Corky Sox, MD       Current Outpatient Medications  Medication Sig Dispense Refill   nicotine (NICODERM CQ - DOSED IN MG/24 HOURS) 21 mg/24hr patch Place 1 patch (21 mg total) onto the skin daily at 6 (six) AM. 28 patch 0    Labs  Lab Results:  Admission on 09/22/2022,  Discharged on 09/22/2022  Component Date Value Ref Range Status   WBC 09/22/2022 10.1  4.0 - 10.5 K/uL Final   RBC 09/22/2022 3.89 (L)  4.22 - 5.81 MIL/uL Final   Hemoglobin 09/22/2022 13.6  13.0 - 17.0 g/dL Final   HCT 09/22/2022 39.9  39.0 - 52.0 % Final   MCV 09/22/2022 102.6 (H)  80.0 - 100.0 fL Final   MCH 09/22/2022 35.0 (H)  26.0 - 34.0 pg Final   MCHC 09/22/2022 34.1  30.0 - 36.0 g/dL Final   RDW 09/22/2022 12.2  11.5 - 15.5 % Final   Platelets 09/22/2022 186   150 - 400 K/uL Final   nRBC 09/22/2022 0.0  0.0 - 0.2 % Final   Neutrophils Relative % 09/22/2022 83  % Final   Neutro Abs 09/22/2022 8.3 (H)  1.7 - 7.7 K/uL Final   Lymphocytes Relative 09/22/2022 12  % Final   Lymphs Abs 09/22/2022 1.2  0.7 - 4.0 K/uL Final   Monocytes Relative 09/22/2022 5  % Final   Monocytes Absolute 09/22/2022 0.5  0.1 - 1.0 K/uL Final   Eosinophils Relative 09/22/2022 0  % Final   Eosinophils Absolute 09/22/2022 0.0  0.0 - 0.5 K/uL Final   Basophils Relative 09/22/2022 0  % Final   Basophils Absolute 09/22/2022 0.0  0.0 - 0.1 K/uL Final   Immature Granulocytes 09/22/2022 0  % Final   Abs Immature Granulocytes 09/22/2022 0.04  0.00 - 0.07 K/uL Final   Performed at [facility], [place] [address], [place], [place] [address]   Sodium 09/22/2022 135  135 - 145 mmol/L Final   Potassium 09/22/2022 4.2  3.5 - 5.1 mmol/L Final   Chloride 09/22/2022 98  98 - 111 mmol/L Final   CO2 09/22/2022 22  22 - 32 mmol/L Final   Glucose, Bld 09/22/2022 155 (H)  70 - 99 mg/dL Final   Glucose reference range applies only to samples taken after fasting for at least 8 hours.   BUN 09/22/2022 16  6 - 20 mg/dL Final   Creatinine, Ser 09/22/2022 1.23  0.61 - 1.24 mg/dL Final   Calcium 09/22/2022 8.5 (L)  8.9 - 10.3 mg/dL Final   Total Protein 09/22/2022 7.0  6.5 - 8.1 g/dL Final   Albumin 09/22/2022 4.1  3.5 - 5.0 g/dL Final   AST 09/22/2022 39  15 - 41 U/L Final   ALT 09/22/2022 20  0 - 44 U/L Final   Alkaline Phosphatase 09/22/2022 66  38 - 126 U/L Final   Total Bilirubin 09/22/2022 0.6  0.3 - 1.2 mg/dL Final   GFR, Estimated 09/22/2022 >60  >60 mL/min Final   Comment: (NOTE) Calculated using the CKD-EPI Creatinine Equation (2021)    Anion gap 09/22/2022 15  5 - 15 Final   Performed at [facility] [address], [place], [place] [address]   Alcohol, Ethyl (B) 09/22/2022 69 (H)  <10 mg/dL Final   Comment: (NOTE) Lowest detectable limit for serum alcohol is 10  mg/dL.  For medical purposes only. Performed at [facility], [place] [address], [place],  [address]    Opiates 09/22/2022 NONE DETECTED  NONE DETECTED Final   Cocaine 09/22/2022 POSITIVE (A)  NONE DETECTED Final   Benzodiazepines 09/22/2022 NONE DETECTED  NONE DETECTED Final   Amphetamines 09/22/2022 POSITIVE (A)  NONE DETECTED Final   Tetrahydrocannabinol 09/22/2022 POSITIVE (A)  NONE DETECTED Final   Barbiturates 09/22/2022 NONE DETECTED  NONE DETECTED Final  Comment: (NOTE) DRUG SCREEN FOR MEDICAL PURPOSES ONLY.  IF CONFIRMATION IS NEEDED FOR ANY PURPOSE, NOTIFY LAB WITHIN 5 DAYS.  LOWEST DETECTABLE LIMITS FOR URINE DRUG SCREEN Drug Class                     Cutoff (ng/mL) Amphetamine and metabolites    1000 Barbiturate and metabolites    200 Benzodiazepine                 200 Opiates and metabolites        300 Cocaine and metabolites        300 THC                            50 Performed at [facility], [place] [address], [place], [place] [address]    Acetaminophen (Tylenol), Serum 09/22/2022 <10 (L)  10 - 30 ug/mL Final   Comment: (NOTE) Therapeutic concentrations vary significantly. A range of 10-30 ug/mL  may be an effective concentration for many patients. However, some  are best treated at concentrations outside of this range. Acetaminophen concentrations >150 ug/mL at 4 hours after ingestion  and >50 ug/mL at 12 hours after ingestion are often associated with  toxic reactions.  Performed at [facility], [place] [address] [place], [place] [id number]    Salicylate Lvl [id number] <7.0 (L)  7.0 - 30.0 mg/dL Final   Performed at [place] [address], [place], [place] [address]   SARS Coronavirus 2 by RT PCR 09/22/2022 NEGATIVE  NEGATIVE Final   Influenza A by PCR 09/22/2022 NEGATIVE  NEGATIVE Final   Influenza B by PCR 09/22/2022 NEGATIVE  NEGATIVE Final   Comment: (NOTE) The Xpert Xpress SARS-CoV-2/FLU/RSV plus assay is intended  as an aid in the diagnosis of influenza from Nasopharyngeal swab specimens and should not be used as a sole basis for treatment. Nasal washings and aspirates are unacceptable for Xpert Xpress SARS-CoV-2/FLU/RSV testing.  Fact Sheet for Patients: EntrepreneurPulse.com.au  Fact Sheet for Healthcare Providers: IncredibleEmployment.be  This test is not yet approved or cleared by the [place] FDA and has been authorized for detection and/or diagnosis of SARS-CoV-2 by FDA under an Emergency Use Authorization (EUA). This EUA will remain in effect (meaning this test can be used) for the duration of the COVID-19 declaration under Section 564(b)(1) of the Act, 21 U.S.C. section 360bbb-3(b)(1), unless the authorization is terminated or revoked.     Resp Syncytial Virus by PCR 09/22/2022 NEGATIVE  NEGATIVE Final   Comment: (NOTE) Fact Sheet for Patients: EntrepreneurPulse.com.au  Fact Sheet for Healthcare Providers: IncredibleEmployment.be  This test is not yet approved or cleared by the [place] FDA and has been authorized for detection and/or diagnosis of SARS-CoV-2 by FDA under an Emergency Use Authorization (EUA). This EUA will remain in effect (meaning this test can be used) for the duration of the COVID-19 declaration under Section 564(b)(1) of the Act, 21 U.S.C. section 360bbb-3(b)(1), unless the authorization is terminated or revoked.  Performed at [facility], [place] [address], [place], [place] [address]   Admission on 09/18/2022, Discharged on 09/20/2022  Component Date Value Ref Range Status   SARS Coronavirus 2 by RT PCR 09/18/2022 NEGATIVE  NEGATIVE Final   Influenza A by PCR 09/18/2022 NEGATIVE  NEGATIVE Final   Influenza B by PCR 09/18/2022 NEGATIVE  NEGATIVE Final   Comment: (NOTE) The Xpert Xpress SARS-CoV-2/FLU/RSV plus  assay is intended as an aid in the diagnosis of influenza  from Nasopharyngeal swab specimens and should not be used as a sole basis for treatment. Nasal washings and aspirates are unacceptable for Xpert Xpress SARS-CoV-2/FLU/RSV testing.  Fact Sheet for Patients: EntrepreneurPulse.com.au  Fact Sheet for Healthcare Providers: IncredibleEmployment.be  This test is not yet approved or cleared by the [place] FDA and has been authorized for detection and/or diagnosis of SARS-CoV-2 by FDA under an Emergency Use Authorization (EUA). This EUA will remain in effect (meaning this test can be used) for the duration of the COVID-19 declaration under Section 564(b)(1) of the Act, 21 U.S.C. section 360bbb-3(b)(1), unless the authorization is terminated or revoked.     Resp Syncytial Virus by PCR 09/18/2022 NEGATIVE  NEGATIVE Final   Comment: (NOTE) Fact Sheet for Patients: EntrepreneurPulse.com.au  Fact Sheet for Healthcare Providers: IncredibleEmployment.be  This test is not yet approved or cleared by the [place] FDA and has been authorized for detection and/or diagnosis of SARS-CoV-2 by FDA under an Emergency Use Authorization (EUA). This EUA will remain in effect (meaning this test can be used) for the duration of the COVID-19 declaration under Section 564(b)(1) of the Act, 21 U.S.C. section 360bbb-3(b)(1), unless the authorization is terminated or revoked.  Performed at [facility], [place] [address], [place], [place] [address]    WBC 09/18/2022 6.7  4.0 - 10.5 K/uL Final   RBC 09/18/2022 4.42  4.22 - 5.81 MIL/uL Final   Hemoglobin 09/18/2022 15.4  13.0 - 17.0 g/dL Final   HCT 09/18/2022 44.6  39.0 - 52.0 % Final   MCV 09/18/2022 100.9 (H)  80.0 - 100.0 fL Final   MCH 09/18/2022 34.8 (H)  26.0 - 34.0 pg Final   MCHC 09/18/2022 34.5  30.0 - 36.0 g/dL Final   RDW 09/18/2022 11.9  11.5 - 15.5 % Final   Platelets 09/18/2022 219  150 - 400 K/uL Final    nRBC 09/18/2022 0.0  0.0 - 0.2 % Final   Neutrophils Relative % 09/18/2022 53  % Final   Neutro Abs 09/18/2022 3.5  1.7 - 7.7 K/uL Final   Lymphocytes Relative 09/18/2022 39  % Final   Lymphs Abs 09/18/2022 2.6  0.7 - 4.0 K/uL Final   Monocytes Relative 09/18/2022 7  % Final   Monocytes Absolute 09/18/2022 0.5  0.1 - 1.0 K/uL Final   Eosinophils Relative 09/18/2022 1  % Final   Eosinophils Absolute 09/18/2022 0.1  0.0 - 0.5 K/uL Final   Basophils Relative 09/18/2022 0  % Final   Basophils Absolute 09/18/2022 0.0  0.0 - 0.1 K/uL Final   Immature Granulocytes 09/18/2022 0  % Final   Abs Immature Granulocytes 09/18/2022 0.01  0.00 - 0.07 K/uL Final   Performed at [facility], [place] [address] [place], [place] [address]   Sodium 09/18/2022 139  135 - 145 mmol/L Final   Potassium 09/18/2022 4.0  3.5 - 5.1 mmol/L Final   Chloride 09/18/2022 99  98 - 111 mmol/L Final   CO2 09/18/2022 27  22 - 32 mmol/L Final   Glucose, Bld 09/18/2022 62 (L)  70 - 99 mg/dL Final   Glucose reference range applies only to samples taken after fasting for at least 8 hours.   BUN 09/18/2022 9  6 - 20 mg/dL Final   Creatinine, Ser 09/18/2022 0.97  0.61 - 1.24 mg/dL Final   Calcium 09/18/2022 9.4  8.9 - 10.3 mg/dL Final   Total Protein  09/18/2022 8.2 (H)  6.5 - 8.1 g/dL Final   Albumin 09/18/2022 4.4  3.5 - 5.0 g/dL Final   AST 09/18/2022 34  15 - 41 U/L Final   ALT 09/18/2022 23  0 - 44 U/L Final   Alkaline Phosphatase 09/18/2022 67  38 - 126 U/L Final   Total Bilirubin 09/18/2022 0.6  0.3 - 1.2 mg/dL Final   GFR, Estimated 09/18/2022 >60  >60 mL/min Final   Comment: (NOTE) Calculated using the CKD-EPI Creatinine Equation (2021)    Anion gap 09/18/2022 13  5 - 15 Final   Performed at [facility], [place] [address] [place], [place] [address]   Hgb A1c MFr Bld 09/18/2022 4.9  4.8 - 5.6 % Final   Comment: (NOTE)         Prediabetes: 5.7 - 6.4         Diabetes: >6.4         Glycemic control for adults  with diabetes: <7.0    Mean Plasma Glucose 09/18/2022 94  mg/dL Final   Comment: (NOTE) Performed At: [facility] [place], [place] [id number] Rush Farmer MD [id number]    Cholesterol 09/18/2022 135  0 - 200 mg/dL Final   Triglycerides 09/18/2022 72  <150 mg/dL Final   HDL 09/18/2022 72  >40 mg/dL Final   Total CHOL/HDL Ratio 09/18/2022 1.9  RATIO Final   VLDL 09/18/2022 14  0 - 40 mg/dL Final   LDL Cholesterol 09/18/2022 49  0 - 99 mg/dL Final   Comment:        Total Cholesterol/HDL:CHD Risk Coronary Heart Disease Risk Table                     Men   Women  1/2 Average Risk   3.4   3.3  Average Risk       5.0   4.4  2 X Average Risk   9.6   7.1  3 X Average Risk  23.4   11.0        Use the calculated Patient Ratio above and the CHD Risk Table to determine the patient's CHD Risk.        ATP III CLASSIFICATION (LDL):  <100     mg/dL   Optimal  100-129  mg/dL   Near or Above                    Optimal  130-159  mg/dL   Borderline  160-189  mg/dL   High  >190     mg/dL   Very High Performed at [place] [address], [place], [place] [address]    TSH 09/18/2022 1.267  0.350 - 4.500 uIU/mL Final   Comment: Performed by a 3rd Generation assay with a functional sensitivity of <=0.01 uIU/mL. Performed at [facility], [place] [address] [place], [place] [address]    POC Amphetamine UR 09/18/2022 None Detected  NONE DETECTED (Cut Off Level 1000 ng/mL) Preliminary   POC Secobarbital (BAR) 09/18/2022 None Detected  NONE DETECTED (Cut Off Level 300 ng/mL) Preliminary   POC Buprenorphine (BUP) 09/18/2022 None Detected  NONE DETECTED (Cut Off Level 10 ng/mL) Preliminary   POC Oxazepam (BZO) 09/18/2022 None Detected  NONE DETECTED (Cut Off Level 300 ng/mL) Preliminary   POC Cocaine UR 09/18/2022 None Detected  NONE DETECTED (Cut Off Level 300 ng/mL) Preliminary   POC Methamphetamine UR 09/18/2022 Positive (A)  NONE DETECTED (Cut  Off Level  1000 ng/mL) Preliminary   POC Morphine 09/18/2022 None Detected  NONE DETECTED (Cut Off Level 300 ng/mL) Preliminary   POC Methadone UR 09/18/2022 None Detected  NONE DETECTED (Cut Off Level 300 ng/mL) Preliminary   POC Oxycodone UR 09/18/2022 Positive (A)  NONE DETECTED (Cut Off Level 100 ng/mL) Preliminary   POC Marijuana UR 09/18/2022 Positive (A)  NONE DETECTED (Cut Off Level 50 ng/mL) Preliminary   SARSCOV2ONAVIRUS 2 AG 09/18/2022 NEGATIVE  NEGATIVE Final   Comment: (NOTE) SARS-CoV-2 antigen NOT DETECTED.   Negative results are presumptive.  Negative results do not preclude SARS-CoV-2 infection and should not be used as the sole basis for treatment or other patient management decisions, including infection  control decisions, particularly in the presence of clinical signs and  symptoms consistent with COVID-19, or in those who have been in contact with the virus.  Negative results must be combined with clinical observations, patient history, and epidemiological information. The expected result is Negative.  Fact Sheet for Patients: HandmadeRecipes.com.cy  Fact Sheet for Healthcare Providers: FuneralLife.at  This test is not yet approved or cleared by the [place] FDA and  has been authorized for detection and/or diagnosis of SARS-CoV-2 by FDA under an Emergency Use Authorization (EUA).  This EUA will remain in effect (meaning this test can be used) for the duration of  the COV                          ID-19 declaration under Section 564(b)(1) of the Act, 21 U.S.C. section 360bbb-3(b)(1), unless the authorization is terminated or revoked sooner.    Admission on 08/05/2022, Discharged on 08/06/2022  Component Date Value Ref Range Status   Lipase 08/05/2022 49  11 - 51 U/L Final   Performed at [facility] [address] [place], [place] [address]   Sodium 08/05/2022 136  135 - 145 mmol/L Final   Potassium 08/05/2022  3.9  3.5 - 5.1 mmol/L Final   Chloride 08/05/2022 102  98 - 111 mmol/L Final   CO2 08/05/2022 26  22 - 32 mmol/L Final   Glucose, Bld 08/05/2022 145 (H)  70 - 99 mg/dL Final   Glucose reference range applies only to samples taken after fasting for at least 8 hours.   BUN 08/05/2022 8  6 - 20 mg/dL Final   Creatinine, Ser 08/05/2022 1.01  0.61 - 1.24 mg/dL Final   Calcium 08/05/2022 9.0  8.9 - 10.3 mg/dL Final   Total Protein 08/05/2022 7.1  6.5 - 8.1 g/dL Final   Albumin 08/05/2022 3.9  3.5 - 5.0 g/dL Final   AST 08/05/2022 27  15 - 41 U/L Final   ALT 08/05/2022 15  0 - 44 U/L Final   Alkaline Phosphatase 08/05/2022 64  38 - 126 U/L Final   Total Bilirubin 08/05/2022 0.5  0.3 - 1.2 mg/dL Final   GFR, Estimated 08/05/2022 >60  >60 mL/min Final   Comment: (NOTE) Calculated using the CKD-EPI Creatinine Equation (2021)    Anion gap 08/05/2022 8  5 - 15 Final   Performed at [facility], [place] [address], [place], [place] [address]   WBC 08/05/2022 6.7  4.0 - 10.5 K/uL Final   RBC 08/05/2022 4.25  4.22 - 5.81 MIL/uL Final   Hemoglobin 08/05/2022 14.7  13.0 - 17.0 g/dL Final   HCT 08/05/2022 44.2  39.0 - 52.0 % Final   MCV 08/05/2022 104.0 (H)  80.0 -  100.0 fL Final   MCH 08/05/2022 34.6 (H)  26.0 - 34.0 pg Final   MCHC 08/05/2022 33.3  30.0 - 36.0 g/dL Final   RDW 08/05/2022 11.9  11.5 - 15.5 % Final   Platelets 08/05/2022 209  150 - 400 K/uL Final   nRBC 08/05/2022 0.0  0.0 - 0.2 % Final   Performed at [facility], [place] [address], [place], [place] [address]   Color, Urine 08/05/2022 YELLOW  YELLOW Final   APPearance 08/05/2022 CLEAR  CLEAR Final   Specific Gravity, Urine 08/05/2022 1.025  1.005 - 1.030 Final   pH 08/05/2022 6.0  5.0 - 8.0 Final   Glucose, UA 08/05/2022 50 (A)  NEGATIVE mg/dL Final   Hgb urine dipstick 08/05/2022 SMALL (A)  NEGATIVE Final   Bilirubin Urine 08/05/2022 NEGATIVE  NEGATIVE Final   Ketones, ur 08/05/2022 NEGATIVE  NEGATIVE mg/dL Final    Protein, ur 08/05/2022 30 (A)  NEGATIVE mg/dL Final   Nitrite 08/05/2022 NEGATIVE  NEGATIVE Final   Leukocytes,Ua 08/05/2022 SMALL (A)  NEGATIVE Final   RBC / HPF 08/05/2022 6-10  0 - 5 RBC/hpf Final   WBC, UA 08/05/2022 11-20  0 - 5 WBC/hpf Final   Bacteria, UA 08/05/2022 RARE (A)  NONE SEEN Final   Squamous Epithelial / HPF 08/05/2022 0-5  0 - 5 /HPF Final   Mucus 08/05/2022 PRESENT   Final   Hyaline Casts, UA 08/05/2022 PRESENT   Final   Performed at [facility], [place] [address] [place], [place] [address]  Admission on 07/05/2022, Discharged on 07/06/2022  Component Date Value Ref Range Status   SARS Coronavirus 2 by RT PCR 07/05/2022 NEGATIVE  NEGATIVE Final   Comment: (NOTE) SARS-CoV-2 target nucleic acids are NOT DETECTED.  The SARS-CoV-2 RNA is generally detectable in upper respiratory specimens during the acute phase of infection. The lowest concentration of SARS-CoV-2 viral copies this assay can detect is 138 copies/mL. A negative result does not preclude SARS-Cov-2 infection and should not be used as the sole basis for treatment or other patient management decisions. A negative result may occur with  improper specimen collection/handling, submission of specimen other than nasopharyngeal swab, presence of viral mutation(s) within the areas targeted by this assay, and inadequate number of viral copies(<138 copies/mL). A negative result must be combined with clinical observations, patient history, and epidemiological information. The expected result is Negative.  Fact Sheet for Patients:  EntrepreneurPulse.com.au  Fact Sheet for Healthcare Providers:  IncredibleEmployment.be  This test is no                          t yet approved or cleared by the [place] FDA and  has been authorized for detection and/or diagnosis of SARS-CoV-2 by FDA under an Emergency Use Authorization (EUA). This EUA will remain  in effect (meaning this  test can be used) for the duration of the COVID-19 declaration under Section 564(b)(1) of the Act, 21 U.S.C.section 360bbb-3(b)(1), unless the authorization is terminated  or revoked sooner.       Influenza A by PCR 07/05/2022 NEGATIVE  NEGATIVE Final   Influenza B by PCR 07/05/2022 NEGATIVE  NEGATIVE Final   Comment: (NOTE) The Xpert Xpress SARS-CoV-2/FLU/RSV plus assay is intended as an aid in the diagnosis of influenza from Nasopharyngeal swab specimens and should not be used as a sole basis for treatment. Nasal washings and aspirates are unacceptable for Xpert Xpress SARS-CoV-2/FLU/RSV testing.  Fact  Sheet for Patients: EntrepreneurPulse.com.au  Fact Sheet for Healthcare Providers: IncredibleEmployment.be  This test is not yet approved or cleared by the [place] FDA and has been authorized for detection and/or diagnosis of SARS-CoV-2 by FDA under an Emergency Use Authorization (EUA). This EUA will remain in effect (meaning this test can be used) for the duration of the COVID-19 declaration under Section 564(b)(1) of the Act, 21 U.S.C. section 360bbb-3(b)(1), unless the authorization is terminated or revoked.     Resp Syncytial Virus by PCR 07/05/2022 NEGATIVE  NEGATIVE Final   Comment: (NOTE) Fact Sheet for Patients: EntrepreneurPulse.com.au  Fact Sheet for Healthcare Providers: IncredibleEmployment.be  This test is not yet approved or cleared by the [place] FDA and has been authorized for detection and/or diagnosis of SARS-CoV-2 by FDA under an Emergency Use Authorization (EUA). This EUA will remain in effect (meaning this test can be used) for the duration of the COVID-19 declaration under Section 564(b)(1) of the Act, 21 U.S.C. section 360bbb-3(b)(1), unless the authorization is terminated or revoked.  Performed at [facility], [place] [address], [place], [place] [address]     WBC 07/05/2022 6.9  4.0 - 10.5 K/uL Final   RBC 07/05/2022 3.75 (L)  4.22 - 5.81 MIL/uL Final   Hemoglobin 07/05/2022 13.3  13.0 - 17.0 g/dL Final   HCT 07/05/2022 37.9 (L)  39.0 - 52.0 % Final   MCV 07/05/2022 101.1 (H)  80.0 - 100.0 fL Final   MCH 07/05/2022 35.5 (H)  26.0 - 34.0 pg Final   MCHC 07/05/2022 35.1  30.0 - 36.0 g/dL Final   RDW 07/05/2022 12.4  11.5 - 15.5 % Final   Platelets 07/05/2022 199  150 - 400 K/uL Final   nRBC 07/05/2022 0.0  0.0 - 0.2 % Final   Neutrophils Relative % 07/05/2022 47  % Final   Neutro Abs 07/05/2022 3.2  1.7 - 7.7 K/uL Final   Lymphocytes Relative 07/05/2022 42  % Final   Lymphs Abs 07/05/2022 2.9  0.7 - 4.0 K/uL Final   Monocytes Relative 07/05/2022 9  % Final   Monocytes Absolute 07/05/2022 0.7  0.1 - 1.0 K/uL Final   Eosinophils Relative 07/05/2022 1  % Final   Eosinophils Absolute 07/05/2022 0.1  0.0 - 0.5 K/uL Final   Basophils Relative 07/05/2022 1  % Final   Basophils Absolute 07/05/2022 0.1  0.0 - 0.1 K/uL Final   Immature Granulocytes 07/05/2022 0  % Final   Abs Immature Granulocytes 07/05/2022 0.01  0.00 - 0.07 K/uL Final   Performed at [facility], [place] [address], [place], [place] [address]   Sodium 07/05/2022 140  135 - 145 mmol/L Final   Potassium 07/05/2022 3.7  3.5 - 5.1 mmol/L Final   Chloride 07/05/2022 102  98 - 111 mmol/L Final   CO2 07/05/2022 27  22 - 32 mmol/L Final   Glucose, Bld 07/05/2022 97  70 - 99 mg/dL Final   Glucose reference range applies only to samples taken after fasting for at least 8 hours.   BUN 07/05/2022 9  6 - 20 mg/dL Final   Creatinine, Ser 07/05/2022 1.13  0.61 - 1.24 mg/dL Final   Calcium 07/05/2022 9.3  8.9 - 10.3 mg/dL Final   Total Protein 07/05/2022 7.0  6.5 - 8.1 g/dL Final   Albumin 07/05/2022 4.1  3.5 - 5.0 g/dL Final   AST 07/05/2022 36  15 - 41 U/L Final   ALT 07/05/2022 22  0 - 44  U/L Final   Alkaline Phosphatase 07/05/2022 57  38 - 126 U/L Final   Total Bilirubin 07/05/2022 0.4   0.3 - 1.2 mg/dL Final   GFR, Estimated 07/05/2022 >60  >60 mL/min Final   Comment: (NOTE) Calculated using the CKD-EPI Creatinine Equation (2021)    Anion gap 07/05/2022 11  5 - 15 Final   Performed at [facility] [address], [place], [place] [address]   Hgb A1c MFr Bld 07/05/2022 4.9  4.8 - 5.6 % Final   Comment: (NOTE)         Prediabetes: 5.7 - 6.4         Diabetes: >6.4         Glycemic control for adults with diabetes: <7.0    Mean Plasma Glucose 07/05/2022 94  mg/dL Final   Comment: (NOTE) Performed At: [facility] [place], [place] [id number] Rush Farmer MD [id number]    Alcohol, Ethyl (B) 07/05/2022 <10  <10 mg/dL Final   Comment: (NOTE) Lowest detectable limit for serum alcohol is 10 mg/dL.  For medical purposes only. Performed at [facility], [place] [address], [place], [place] [address]    Cholesterol 07/05/2022 111  0 - 200 mg/dL Final   Triglycerides 07/05/2022 38  <150 mg/dL Final   HDL 07/05/2022 71  >40 mg/dL Final   Total CHOL/HDL Ratio 07/05/2022 1.6  RATIO Final   VLDL 07/05/2022 8  0 - 40 mg/dL Final   LDL Cholesterol 07/05/2022 32  0 - 99 mg/dL Final   Comment:        Total Cholesterol/HDL:CHD Risk Coronary Heart Disease Risk Table                     Men   Women  1/2 Average Risk   3.4   3.3  Average Risk       5.0   4.4  2 X Average Risk   9.6   7.1  3 X Average Risk  23.4   11.0        Use the calculated Patient Ratio above and the CHD Risk Table to determine the patient's CHD Risk.        ATP III CLASSIFICATION (LDL):  <100     mg/dL   Optimal  100-129  mg/dL   Near or Above                    Optimal  130-159  mg/dL   Borderline  160-189  mg/dL   High  >190     mg/dL   Very High Performed at [facility] [address], [place], [place] [address]    TSH 07/05/2022 0.914  0.350 - 4.500 uIU/mL Final   Comment: Performed by a 3rd Generation assay with a functional sensitivity of <=0.01  uIU/mL. Performed at [facility], [place] [address] [place], [place] [address]    SARSCOV2ONAVIRUS 2 AG 07/05/2022 NEGATIVE  NEGATIVE Final   Comment: (NOTE) SARS-CoV-2 antigen NOT DETECTED.   Negative results are presumptive.  Negative results do not preclude SARS-CoV-2 infection and should not be used as the sole basis for treatment or other patient management decisions, including infection  control decisions, particularly in the presence of clinical signs and  symptoms consistent with COVID-19, or in those who have been in contact with the virus.  Negative results must be combined with clinical observations, patient history, and epidemiological information. The expected  result is Negative.  Fact Sheet for Patients: HandmadeRecipes.com.cy  Fact Sheet for Healthcare Providers: FuneralLife.at  This test is not yet approved or cleared by the [place] FDA and  has been authorized for detection and/or diagnosis of SARS-CoV-2 by FDA under an Emergency Use Authorization (EUA).  This EUA will remain in effect (meaning this test can be used) for the duration of  the COV                          ID-19 declaration under Section 564(b)(1) of the Act, 21 U.S.C. section 360bbb-3(b)(1), unless the authorization is terminated or revoked sooner.    Admission on 05/16/2022, Discharged on 05/17/2022  Component Date Value Ref Range Status   Sodium 05/17/2022 135  135 - 145 mmol/L Final   Potassium 05/17/2022 3.7  3.5 - 5.1 mmol/L Final   Chloride 05/17/2022 102  98 - 111 mmol/L Final   CO2 05/17/2022 25  22 - 32 mmol/L Final   Glucose, Bld 05/17/2022 152 (H)  70 - 99 mg/dL Final   Glucose reference range applies only to samples taken after fasting for at least 8 hours.   BUN 05/17/2022 22 (H)  6 - 20 mg/dL Final   Creatinine, Ser 05/17/2022 1.41 (H)  0.61 - 1.24 mg/dL Final   Calcium 05/17/2022 9.2  8.9 - 10.3 mg/dL Final   Total Protein  05/17/2022 8.5 (H)  6.5 - 8.1 g/dL Final   Albumin 05/17/2022 4.8  3.5 - 5.0 g/dL Final   AST 05/17/2022 47 (H)  15 - 41 U/L Final   ALT 05/17/2022 30  0 - 44 U/L Final   Alkaline Phosphatase 05/17/2022 65  38 - 126 U/L Final   Total Bilirubin 05/17/2022 0.7  0.3 - 1.2 mg/dL Final   GFR, Estimated 05/17/2022 >60  >60 mL/min Final   Comment: (NOTE) Calculated using the CKD-EPI Creatinine Equation (2021)    Anion gap 05/17/2022 8  5 - 15 Final   Performed at [facility], [place] [address] [place], [place] [address]   Alcohol, Ethyl (B) 05/17/2022 <10  <10 mg/dL Final   Comment: (NOTE) Lowest detectable limit for serum alcohol is 10 mg/dL.  For medical purposes only. Performed at [facility], [place] [address] [place], [place] [id number]    Salicylate Lvl 99991111 <7.0 (L)  7.0 - 30.0 mg/dL Final   Performed at [facility] [address], [place], [place] [address]   Acetaminophen (Tylenol), Serum 05/17/2022 <10 (L)  10 - 30 ug/mL Final   Comment: (NOTE) Therapeutic concentrations vary significantly. A range of 10-30 ug/mL  may be an effective concentration for many patients. However, some  are best treated at concentrations outside of this range. Acetaminophen concentrations >150 ug/mL at 4 hours after ingestion  and >50 ug/mL at 12 hours after ingestion are often associated with  toxic reactions.  Performed at [facility], [place] [address], [place], [place] [address]    WBC 05/17/2022 7.2  4.0 - 10.5 K/uL Final   RBC 05/17/2022 4.33  4.22 - 5.81 MIL/uL Final   Hemoglobin 05/17/2022 14.9  13.0 - 17.0 g/dL Final   HCT 05/17/2022 44.2  39.0 - 52.0 % Final   MCV 05/17/2022 102.1 (H)  80.0 - 100.0 fL Final   MCH 05/17/2022 34.4 (H)  26.0 - 34.0 pg Final   MCHC 05/17/2022 33.7  30.0 - 36.0 g/dL Final  RDW 05/17/2022 12.4  11.5 - 15.5 % Final   Platelets 05/17/2022 191  150 - 400 K/uL Final   nRBC  05/17/2022 0.0  0.0 - 0.2 % Final   Performed at [facility], [place] [address], [place], [place] [address]   Opiates 05/17/2022 NONE DETECTED  NONE DETECTED Final   Cocaine 05/17/2022 POSITIVE (A)  NONE DETECTED Final   Benzodiazepines 05/17/2022 NONE DETECTED  NONE DETECTED Final   Amphetamines 05/17/2022 NONE DETECTED  NONE DETECTED Final   Tetrahydrocannabinol 05/17/2022 POSITIVE (A)  NONE DETECTED Final   Barbiturates 05/17/2022 NONE DETECTED  NONE DETECTED Final   Comment: (NOTE) DRUG SCREEN FOR MEDICAL PURPOSES ONLY.  IF CONFIRMATION IS NEEDED FOR ANY PURPOSE, NOTIFY LAB WITHIN 5 DAYS.  LOWEST DETECTABLE LIMITS FOR URINE DRUG SCREEN Drug Class                     Cutoff (ng/mL) Amphetamine and metabolites    1000 Barbiturate and metabolites    200 Benzodiazepine                 200 Opiates and metabolites        300 Cocaine and metabolites        300 THC                            50 Performed at [facility], [place] [address], [place], [place] [address]     Blood Alcohol level:  Lab Results  Component Value Date   ETH 69 (H) 09/22/2022   ETH <10 [id number]    Metabolic Disorder Labs: Lab Results  Component Value Date   HGBA1C 4.9 09/18/2022   MPG 94 09/18/2022   MPG 94 07/05/2022   No results found for: "PROLACTIN" Lab Results  Component Value Date   CHOL 135 09/18/2022   TRIG 72 09/18/2022   HDL 72 09/18/2022   CHOLHDL 1.9 09/18/2022   VLDL 14 09/18/2022   LDLCALC 49 09/18/2022   LDLCALC 32 07/05/2022    Therapeutic Lab Levels: No results found for: "LITHIUM" No results found for: "VALPROATE" No results found for: "CBMZ"  Physical Findings   PHQ2-9    Flowsheet Row ED from 09/22/2022 in [facility] ED from 09/18/2022 in [facility] ED from 11/05/2021 in [facility]  PHQ-2 Total Score '2 3 2  '$ PHQ-9 Total Score '10 12 13       '$ [place] ED from 09/22/2022 in [facility] Most recent reading at 09/22/2022  2:08 PM ED from 09/22/2022 in [facility] Emergency Department at [facility] Most recent reading at 09/22/2022  2:07 AM ED from 09/18/2022 in [facility] Most recent reading at 09/18/2022 10:43 PM  C-SSRS RISK CATEGORY No Risk No Risk No Risk        Musculoskeletal  Strength & Muscle Tone: within normal limits Gait & Station: normal Patient leans: N/A  Psychiatric Specialty Exam  Presentation General Appearance: Appropriate for Environment  Eye Contact:Fair  Speech:Clear and Coherent  Speech Volume:Normal  Handedness:-- (not assessed)   Mood and Affect  Mood:Euthymic  Affect:Congruent   Thought Process  Thought Processes:Coherent; Linear  Descriptions of Associations:Intact  Orientation:Full (Time, Place and Person)  Thought Content:Logical    Hallucinations:Hallucinations: None  Ideas of Reference:None  Suicidal Thoughts:Suicidal Thoughts: No  Homicidal Thoughts:Homicidal Thoughts: No  Sensorium  Memory:Immediate Fair; Recent Fair; Remote Fair  Judgment:Fair  Insight:Fair   Executive Functions  Concentration:Fair  Attention Span:Fair  [place]   Psychomotor Activity  Psychomotor Activity:Psychomotor Activity: Normal   Assets  Assets:Communication Skills; Resilience   Sleep  Sleep:Sleep: Fair   Nutritional Assessment (For OBS and FBC admissions only) Has the patient had a weight loss or gain of 10 pounds or more in the last 3 months?: No Has the patient had a decrease in food intake/or appetite?: Yes Does the patient have dental problems?: No Does the patient have eating habits or behaviors that may be indicators of an eating disorder including binging or inducing vomiting?: No Has the patient recently lost weight without trying?: 0 Has the  patient been eating poorly because of a decreased appetite?: 0 Malnutrition Screening Tool Score: 0    Physical Exam Constitutional:      Appearance: the patient is not toxic-appearing.  Pulmonary:     Effort: Pulmonary effort is normal.  Neurological:     General: No focal deficit present.     Mental Status: the patient is alert and oriented to person, place, and time.   Review of Systems  Respiratory:  Negative for shortness of breath.   Cardiovascular:  Negative for chest pain.  Gastrointestinal:  Negative for abdominal pain, constipation, diarrhea, nausea and vomiting.  Neurological:  Negative for headaches.    BP 105/80 (BP Location: Right Arm)   Pulse 68   Temp 98.1 F (36.7 C) (Oral)   Resp 18   SpO2 100%   Assessment and Plan:  Status: Voluntary, patient can be discharged AMA should he request given lack of self-harm behavior   Alcohol and methamphetamine use disorders - Monitor for withdrawal symptoms, CIWA with as needed Ativan   Medical: - UDS positive for cocaine and amphetamines and THC - Lab work unremarkable   Corky Sox, MD 09/24/2022 1:57 PM

## 2022-09-24 NOTE — ED Notes (Signed)
Patient observed/assessed in room in bed appearing in no immediate distress resting peacefully. Q15 minute checks continued by MHT and nursing staff. Will continue to monitor and support. 

## 2022-09-24 NOTE — ED Notes (Signed)
Patient observed/assessed at nursing station. Patient alert and oriented x 4. Affect is flat. Patient denies pain and anxiety. He denies A/V/H. He denies having any thoughts/plan of self harm and harm towards others. Fluid and snack offered. Patient states that appetite has been good throughout the day.  Verbalizes no further complaints at this time. Will continue to monitor and support.

## 2022-09-24 NOTE — ED Notes (Signed)
Patient has been awake and alert on unit throughout the day.  Patient was initially irritable and resistant to attend groups however he was coaxed to make phone calls to secure treatment options and patient was able to get accepted to [facility] treatment center.  Patients overall attitude became more positive after this happened.  He is brighter, less anxious and less irritable.  He is scheduled to go to program tomorrow.  Will monitor and provide a safe environment.

## 2022-09-24 NOTE — Discharge Planning (Signed)
LCSW aware that patient has been accepted to [facility] on tomorrow 03/07 by 9:00am. Patient stopped LCSW in the hallway and asked if he could go to [facility] instead of [facility] as his mother would prefer him to get far away from [place]. Patient reports he would like to get far away from [facility] and wanted to know if [facility] was an option. Referral was sent to agency on last week. Updated clinicals were provided for their review via email on today.   LCSW received update from [place] requesting phone screening with patient. Contact information provided to patient and patient was advised to make call as soon as possible. Patient completed phone screening. Per Estill Bamberg, patient has been accepted and can admit to their facility on tomorrow. [facility] will provide transport and will call in the morning regarding ETA. 30 day scripts needed for admission. No other needs to report at this time.   LCSW spoke with patient and patient was appreciative of LCSW assistance with securing placement for him. Patient reports his family will bring clothing for program. MD and RN made aware. No other needs were reported at this time.   LCSW did contact [facility] to cancel admit for tomorrow.   Lucius Conn, LCSW Clinical Social Worker [place] [facility] Ph: [phone]

## 2022-09-25 DIAGNOSIS — F1410 Cocaine abuse, uncomplicated: Secondary | ICD-10-CM | POA: Diagnosis not present

## 2022-09-25 NOTE — ED Notes (Addendum)
Patient A&O x 4, ambulatory. Patient discharged in no acute distress. Patient denied SI/HI, A/VH upon discharge. Patient verbalized understanding of all discharge instructions explained by staff, to include follow up appointments, RX's and safety plan. Patient reported mood 10/10.  Pt belongings returned to patient from locker #  19 intact. Patient escorted to lobby via staff for transport to destination. Safety maintained.

## 2022-09-25 NOTE — ED Notes (Signed)
Patient is sleeping. Respirations equal and unlabored, skin warm and dry, NAD. No change in assessment or acuity. Routine safety checks conducted according to facility protocol. Will continue to monitor for safety.

## 2022-09-25 NOTE — Discharge Planning (Signed)
LCSW received phone call from Driver for [facility] who reports they will be here by 10:30AM to transport the patient. Patient and staff made aware. No other needs were reported at this time. LCSW to sign off.   Please inform if further LCSW needs arise prior to discharge.   Lucius Conn, LCSW Clinical Social Worker [place] Ph: [phone]

## 2022-09-25 NOTE — ED Provider Notes (Addendum)
FBC/OBS ASAP Discharge Summary  Date and Time: 09/25/2022 8:59 AM  Name: Calvin [name]  MRN:  [id number]   Discharge Diagnoses:  Final diagnoses:  Cocaine abuse ([place])    Subjective:  The patient is a 37 year old male with history of alcohol use disorder, cocaine use disorder, and cannabis use disorder.  The patient was discharged from the facility based crisis 2 days ago.  He left before receiving residential rehab placement and said that he would go live with his girlfriend and get placement at [facility] treatment center on his own.  The patient was found unresponsive and was brought to the emergency department.  He was noted to be hypoglycemic by EMS and was given D50.  His care was transferred to the facility based crisis for residential rehab placement.   Stay Summary:  Uneventful detox.  Patient found placement at [facility] treatment center.  The patient denies auditory/visual hallucinations.  The patient reports good mood, appetite, and sleep. They deny suicidal and homicidal thoughts. The patient denies side effects from their medications.  Review of systems as below. The patient denies experiencing any withdrawal symptoms.    Total Time spent with patient: 20 minutes  Past Psychiatric History: as above Past Medical History: as above Family History: none Family Psychiatric History: none Social History: as above Tobacco Cessation:  A prescription for an FDA-approved tobacco cessation medication provided at discharge   Current Medications:  Current Facility-Administered Medications  Medication Dose Route Frequency Provider Last Rate Last Admin   alum & mag hydroxide-simeth (MAALOX/MYLANTA) 200-200-20 MG/5ML suspension 30 mL  30 mL Oral Q4H PRN Corky Sox, MD       hydrOXYzine (ATARAX) tablet 25 mg  25 mg Oral TID PRN Corky Sox, MD       LORazepam (ATIVAN) tablet 1 mg  1 mg Oral Q6H PRN Corky Sox, MD       magnesium hydroxide (MILK OF MAGNESIA) suspension 30  mL  30 mL Oral Daily PRN Corky Sox, MD       nicotine (NICODERM CQ - dosed in mg/24 hours) patch 21 mg  21 mg Transdermal Q0600 Corky Sox, MD       Current Outpatient Medications  Medication Sig Dispense Refill   nicotine (NICODERM CQ - DOSED IN MG/24 HOURS) 21 mg/24hr patch Place 1 patch (21 mg total) onto the skin daily at 6 (six) AM. 28 patch 0    PTA Medications:  Facility Ordered Medications  Medication   [COMPLETED] thiamine (VITAMIN B1) injection 100 mg   alum & mag hydroxide-simeth (MAALOX/MYLANTA) 200-200-20 MG/5ML suspension 30 mL   magnesium hydroxide (MILK OF MAGNESIA) suspension 30 mL   hydrOXYzine (ATARAX) tablet 25 mg   nicotine (NICODERM CQ - dosed in mg/24 hours) patch 21 mg   LORazepam (ATIVAN) tablet 1 mg       09/25/2022    8:59 AM 09/22/2022    4:58 PM 09/20/2022   10:54 AM  Depression screen PHQ 2/9  Decreased Interest '1 1 1  '$ Down, Depressed, Hopeless 0 1 2  PHQ - 2 Score '1 2 3  '$ Altered sleeping 0 2 2  Tired, decreased energy '1 2 2  '$ Change in appetite 0 2 2  Feeling bad or failure about yourself  0  1  Trouble concentrating 0 1 1  Moving slowly or fidgety/restless 0 1 1  Suicidal thoughts 0 0 0  PHQ-9 Score '2 10 12  '$ Difficult doing work/chores Not difficult at all Somewhat difficult  [place] ED from 09/22/2022 in [facility] Most recent reading at 09/22/2022  2:08 PM ED from 09/22/2022 in [facility] Emergency Department at [facility] Most recent reading at 09/22/2022  2:07 AM ED from 09/18/2022 in [facility] Most recent reading at 09/18/2022 10:43 PM  C-SSRS RISK CATEGORY No Risk No Risk No Risk      Musculoskeletal  Strength & Muscle Tone: within normal limits Gait & Station: normal Patient leans: N/A  Psychiatric Specialty Exam  Presentation General Appearance: Appropriate for Environment  Eye Contact:Fair  Speech:Clear and Coherent  Speech  Volume:Normal  Handedness:-- (not assessed)   Mood and Affect  Mood:Euthymic  Affect:Congruent   Thought Process  Thought Processes:Coherent; Linear  Descriptions of Associations:Intact  Orientation:Full (Time, Place and Person)  Thought Content:Logical    Hallucinations:Hallucinations: None  Ideas of Reference:None  Suicidal Thoughts:Suicidal Thoughts: No  Homicidal Thoughts:Homicidal Thoughts: No   Sensorium  Memory:Immediate Fair; Recent Fair; Remote Fair  Judgment:Fair  Insight:Fair   Executive Functions  Concentration:Fair  Attention Span:Fair  [place]   Psychomotor Activity  Psychomotor Activity:Psychomotor Activity: Normal   Assets  Assets:Communication Skills; Resilience   Sleep  Sleep:Sleep: Fair    Physical Exam Constitutional:      Appearance: the patient is not toxic-appearing.  Pulmonary:     Effort: Pulmonary effort is normal.  Neurological:     General: No focal deficit present.     Mental Status: the patient is alert and oriented to person, place, and time.   Review of Systems  Respiratory:  Negative for shortness of breath.   Cardiovascular:  Negative for chest pain.  Gastrointestinal:  Negative for abdominal pain, constipation, diarrhea, nausea and vomiting.  Neurological:  Negative for headaches.    BP 101/74 (BP Location: Right Arm)   Pulse 70   Temp 98.6 F (37 C) (Tympanic)   Resp 16   SpO2 100%   Demographic Factors:  None pertinent   Loss Factors: NA   Historical Factors: NA   Risk Reduction Factors:   Positive social support, Positive therapeutic relationship, and Positive coping skills or problem solving skills   Continued Clinical Symptoms:  Substance use    Cognitive Features That Contribute To Risk:  None     Suicide Risk:  Mild   Plan Of Care/Follow-up recommendations:  Activity as tolerated. Diet as recommended by PCP. Keep all  scheduled follow-up appointments as recommended.   Patient is instructed to take all prescribed medications as recommended. Report any side effects or adverse reactions to your outpatient psychiatrist. Patient is instructed to abstain from alcohol and illegal drugs while on prescription medications. In the event of worsening symptoms, patient is instructed to call the crisis hotline, 911, or go to the nearest emergency department for evaluation and treatment.     Patient is also instructed prior to discharge to: Take all medications as prescribed by mental healthcare provider. Report any adverse effects and or reactions from the medicines to outpatient provider promptly. Patient has been instructed & cautioned: To not engage in alcohol and or illegal drug use while on prescription medicines. In the event of worsening symptoms,  patient is instructed to call the crisis hotline, 911 and or go to the nearest ED for appropriate evaluation and treatment of symptoms. To follow-up with primary care provider for other medical issues, concerns and or health care needs    Disposition: [facility] treatment center  Corky Sox, MD 09/25/2022, 8:59 AM

## 2022-09-25 NOTE — ED Notes (Signed)
Patient A&Ox4. Patient denies SI/Hi and AVH.  Patient denies any physical complaints when asked. No acute distress noted. Support and encouragement provided. Routine safety checks conducted according to facility protocol. Encouraged patient to notify staff if thoughts of harm toward self or others arise. Patient verbalize understanding and agreement. Will continue to monitor for safety.    

## 2022-09-25 NOTE — ED Notes (Signed)
"  Pt denies SI, plan, and intention.  Suicide safety plan completed, reviewed with this RN, given to the patient, and a copy in the chart." 

## 2022-10-16 DIAGNOSIS — Y900 Blood alcohol level of less than 20 mg/100 ml: Secondary | ICD-10-CM | POA: Insufficient documentation

## 2022-10-16 DIAGNOSIS — R1084 Generalized abdominal pain: Secondary | ICD-10-CM | POA: Diagnosis present

## 2022-10-16 DIAGNOSIS — R110 Nausea: Secondary | ICD-10-CM | POA: Diagnosis not present

## 2022-10-16 DIAGNOSIS — R197 Diarrhea, unspecified: Secondary | ICD-10-CM | POA: Insufficient documentation

## 2022-10-17 ENCOUNTER — Emergency Department (HOSPITAL_COMMUNITY)
Admission: EM | Admit: 2022-10-17 | Discharge: 2022-10-17 | Disposition: A | Discharge status: Home / Self Care | Payer: Commercial Managed Care - HMO | Attending: Emergency Medicine | Admitting: Emergency Medicine

## 2022-10-17 ENCOUNTER — Other Ambulatory Visit: Payer: Self-pay

## 2022-10-17 ENCOUNTER — Ambulatory Visit (HOSPITAL_COMMUNITY)
Admission: EM | Admit: 2022-10-17 | Discharge: 2022-10-17 | Disposition: A | Discharge status: Home / Self Care | Payer: Commercial Managed Care - HMO

## 2022-10-17 DIAGNOSIS — R197 Diarrhea, unspecified: Secondary | ICD-10-CM

## 2022-10-17 DIAGNOSIS — F1990 Other psychoactive substance use, unspecified, uncomplicated: Secondary | ICD-10-CM

## 2022-10-17 DIAGNOSIS — R1084 Generalized abdominal pain: Secondary | ICD-10-CM

## 2022-10-17 DIAGNOSIS — F1410 Cocaine abuse, uncomplicated: Secondary | ICD-10-CM

## 2022-10-17 LAB — CBC WITH DIFFERENTIAL/PLATELET
Abs Immature Granulocytes: 0.01 10*3/uL (ref 0.00–0.07)
Basophils Absolute: 0 10*3/uL (ref 0.0–0.1)
Basophils Relative: 0 %
Eosinophils Absolute: 0.1 10*3/uL (ref 0.0–0.5)
Eosinophils Relative: 2 %
HCT: 39.5 % (ref 39.0–52.0)
Hemoglobin: 13.1 g/dL (ref 13.0–17.0)
Immature Granulocytes: 0 %
Lymphocytes Relative: 28 %
Lymphs Abs: 1.9 10*3/uL (ref 0.7–4.0)
MCH: 34.6 pg — ABNORMAL HIGH (ref 26.0–34.0)
MCHC: 33.2 g/dL (ref 30.0–36.0)
MCV: 104.2 fL — ABNORMAL HIGH (ref 80.0–100.0)
Monocytes Absolute: 0.6 10*3/uL (ref 0.1–1.0)
Monocytes Relative: 8 %
Neutro Abs: 4.2 10*3/uL (ref 1.7–7.7)
Neutrophils Relative %: 62 %
Platelets: 209 10*3/uL (ref 150–400)
RBC: 3.79 MIL/uL — ABNORMAL LOW (ref 4.22–5.81)
RDW: 13.1 % (ref 11.5–15.5)
WBC: 6.9 10*3/uL (ref 4.0–10.5)
nRBC: 0 % (ref 0.0–0.2)

## 2022-10-17 LAB — COMPREHENSIVE METABOLIC PANEL
ALT: 70 U/L — ABNORMAL HIGH (ref 0–44)
AST: 45 U/L — ABNORMAL HIGH (ref 15–41)
Albumin: 4.3 g/dL (ref 3.5–5.0)
Alkaline Phosphatase: 68 U/L (ref 38–126)
Anion gap: 8 (ref 5–15)
BUN: 9 mg/dL (ref 6–20)
CO2: 23 mmol/L (ref 22–32)
Calcium: 8.6 mg/dL — ABNORMAL LOW (ref 8.9–10.3)
Chloride: 105 mmol/L (ref 98–111)
Creatinine, Ser: 0.88 mg/dL (ref 0.61–1.24)
GFR, Estimated: >60 mL/min (ref >60)
Glucose, Bld: 81 mg/dL (ref 70–99)
Potassium: 3.5 mmol/L (ref 3.5–5.1)
Sodium: 136 mmol/L (ref 135–145)
Total Bilirubin: 1.3 mg/dL — ABNORMAL HIGH (ref 0.3–1.2)
Total Protein: 7.2 g/dL (ref 6.5–8.1)

## 2022-10-17 LAB — LACTIC ACID, PLASMA: Lactic Acid, Venous: 0.8 mmol/L (ref 0.5–1.9)

## 2022-10-17 LAB — LIPASE, BLOOD: Lipase: 27 U/L (ref 11–51)

## 2022-10-17 LAB — ETHANOL: Alcohol, Ethyl (B): <10 mg/dL (ref <10)

## 2022-10-17 MED ORDER — LOPERAMIDE HCL 2 MG PO CAPS: 2.0000 mg | ORAL_CAPSULE | Freq: Four times a day (QID) | ORAL | 0 refills | Status: DC | PRN

## 2022-10-17 MED ORDER — DICYCLOMINE HCL 20 MG PO TABS: 20.0000 mg | ORAL_TABLET | Freq: Three times a day (TID) | ORAL | 0 refills | Status: DC

## 2022-10-17 MED ORDER — ONDANSETRON 4 MG PO TBDP: ORAL_TABLET | ORAL | 0 refills | Status: DC

## 2022-10-17 MED ORDER — ONDANSETRON HCL 4 MG/2ML IJ SOLN
4.0000 mg | Freq: Once | INTRAMUSCULAR | Status: AC
  Administered 2022-10-17: 4 mg via INTRAVENOUS
  Filled 2022-10-17: qty 2

## 2022-10-17 MED ORDER — LACTATED RINGERS IV BOLUS
1000.0000 mL | Freq: Once | INTRAVENOUS | Status: AC
  Administered 2022-10-17: 1000 mL via INTRAVENOUS

## 2022-10-17 NOTE — ED Triage Notes (Signed)
Patient coming to ED for evaluation of abdominal pain, nausea, and diarrhea.  Reports pain has been intermittent and has had in the past.  No diagnosis of reason for pain.  No reports of sick contacts

## 2022-10-17 NOTE — ED Provider Notes (Signed)
[place] EMERGENCY DEPARTMENT AT [facility] Provider Note   CSN: [id number] Arrival date & time: 10/16/22  2346     History  Chief Complaint  Patient presents with   Abdominal Pain   Diarrhea    Calvin [name] is a 37 y.o. male.  Dents to the emergency department for evaluation of abdominal pain, nausea without vomiting and diarrhea.  He reports that he has had intermittent episodes of this in the past, has been seen in the ED but no cause has been found.  He has not had any fever.  No chest pain or shortness of breath.       Home Medications Prior to Admission medications   Medication Sig Start Date End Date Taking? Authorizing Provider  loratadine (CLARITIN) 10 MG tablet Take 10 mg by mouth daily as needed for allergies.   Yes [provider]  Melatonin 10 MG TABS Take 1 tablet by mouth at bedtime as needed (sleep).   Yes [provider]  nicotine (NICODERM CQ - DOSED IN MG/24 HOURS) 21 mg/24hr patch Place 1 patch (21 mg total) onto the skin daily at 6 (six) AM. Patient not taking: Reported on 10/17/2022 09/23/22   Corky Sox, MD      Allergies    Tylenol [acetaminophen]    Review of Systems   Review of Systems  Physical Exam Updated Vital Signs BP (!) 137/98 (BP Location: Right Arm)   Pulse 96   Temp 97.6 F (36.4 C) (Oral)   Resp 16   Ht 6\' 1"  (1.854 m)   SpO2 98%   BMI 19.13 kg/m  Physical Exam Vitals and nursing note reviewed.  Constitutional:      General: He is not in acute distress.    Appearance: He is well-developed.  HENT:     Head: Normocephalic and atraumatic.     Mouth/Throat:     Mouth: Mucous membranes are moist.  Eyes:     General: Vision grossly intact. Gaze aligned appropriately.     Extraocular Movements: Extraocular movements intact.     Conjunctiva/sclera: Conjunctivae normal.  Cardiovascular:     Rate and Rhythm: Normal rate and regular rhythm.     Pulses: Normal pulses.     Heart sounds:  Normal heart sounds, S1 normal and S2 normal. No murmur heard.    No friction rub. No gallop.  Pulmonary:     Effort: Pulmonary effort is normal. No respiratory distress.     Breath sounds: Normal breath sounds.  Abdominal:     Palpations: Abdomen is soft.     Tenderness: There is generalized abdominal tenderness. There is no guarding or rebound.     Hernia: No hernia is present.  Musculoskeletal:        General: No swelling.     Cervical back: Full passive range of motion without pain, normal range of motion and neck supple. No pain with movement, spinous process tenderness or muscular tenderness. Normal range of motion.     Right lower leg: No edema.     Left lower leg: No edema.  Skin:    General: Skin is warm and dry.     Capillary Refill: Capillary refill takes less than 2 seconds.     Findings: No ecchymosis, erythema, lesion or wound.  Neurological:     Mental Status: He is alert and oriented to person, place, and time.     GCS: GCS eye subscore is 4. GCS verbal subscore is 5. GCS motor  subscore is 6.     Cranial Nerves: Cranial nerves 2-12 are intact.     Sensory: Sensation is intact.     Motor: Motor function is intact. No weakness or abnormal muscle tone.     Coordination: Coordination is intact.  Psychiatric:        Mood and Affect: Mood normal.        Speech: Speech normal.        Behavior: Behavior normal.     ED Results / Procedures / Treatments   Labs (all labs ordered are listed, but only abnormal results are displayed) Labs Reviewed  CBC WITH DIFFERENTIAL/PLATELET - Abnormal; Notable for the following components:      Result Value   RBC 3.79 (*)    MCV 104.2 (*)    MCH 34.6 (*)    All other components within normal limits  COMPREHENSIVE METABOLIC PANEL - Abnormal; Notable for the following components:   Calcium 8.6 (*)    AST 45 (*)    ALT 70 (*)    Total Bilirubin 1.3 (*)    All other components within normal limits  LIPASE, BLOOD  LACTIC ACID,  PLASMA  ETHANOL  RAPID URINE DRUG SCREEN, HOSP PERFORMED    EKG None  Radiology No results found.  Procedures Procedures    Medications Ordered in ED Medications  lactated ringers bolus 1,000 mL (1,000 mLs Intravenous New Bag/Given 10/17/22 0112)  ondansetron (ZOFRAN) injection 4 mg (4 mg Intravenous Given 10/17/22 0113)    ED Course/ Medical Decision Making/ A&P                             Medical Decision Making Amount and/or Complexity of Data Reviewed External Data Reviewed: labs, radiology and notes. Labs: ordered. Decision-making details documented in ED Course.  Risk Prescription drug management.   Differential Diagnosis considered includes, but not limited to: Cholelithiasis; cholecystitis; cholangitis; bowel obstruction; esophagitis; gastritis; peptic ulcer disease; pancreatitis; diverticulitis; colitis; appendicitis  Patient presents with diffuse abdominal discomfort, nausea and diarrhea.  He reports similar symptoms in the past.  I was able to access his records and he was seen for similar symptoms in January of this year.  At that time his workup included a CT abdomen and pelvis that did not show any acute abnormality.  Is a nonfocal abdominal exam today.  No tenderness at McBurney's point, negative Murphy sign.  There are no signs of peritonitis.  Labs are unremarkable, no leukocytosis.  He is afebrile.  Presentation is not consistent with an acute surgical process, is similar to prior.  Does not require repeat imaging.  Patient treated symptomatically.        Final Clinical Impression(s) / ED Diagnoses Final diagnoses:  Generalized abdominal pain  Diarrhea, unspecified type    Rx / DC Orders ED Discharge Orders     None         Lota Leamer, Gwenyth Allegra, MD 10/17/22 [phone]

## 2022-10-17 NOTE — ED Triage Notes (Signed)
Pt presents to [facility] voluntarily seeking detox and substance use treatment. Pt states he graduated from a program at [facility] treatment center last week , he stayed for 17 days, and relapsed the next day. Pt reports using marijuana and cocaine daily about half a gram of cocaine, and a quarter. Last use of cocaine and marijuana was yesterday Pt reports he was [facility] being treated for abdominal pain, pt states he does not believe his abdominal pain is associated with withdrawal symptoms. Pt denies any withdrawal symptoms. Pt states he would like to go to a sober living facility but has to detox first.Pt denies SI/HI and AVH.

## 2022-10-17 NOTE — Discharge Instructions (Addendum)
Based on the information that you have provided and the presenting issues outpatient services and resources for have been recommended.  It is imperative that you follow through with treatment recommendations within 5-7 days from the of discharge to mitigate further risk to your safety and mental well-being. A list of referrals has been provided below to get you started.  You are not limited to the list provided.  In case of an urgent crisis, you may contact the Mobile Crisis Unit with [facility] at [phone].                                            Substance Abuse Treatment Facilities  [facility] [place], [place], [address] Men's Program - Roderic Ovens [place] - [id number] Women's Program - Delma Freeze [phone] Intake Coordinator - [phone]  (52-month program offering Hep C treatment to anyone who reaches the 35-month mark in their program.  Free of cost.  No controlled substances allowed for medications.  No heavy anti-psychotics like the ones that treat schizophrenia or Seroquel.  Transportation for pick-up is available.)   [facility] [address], [place] [address] [phone]  ([place] is not for everyone.  They have stringent acceptance criteria.  No pending legal issues, no psychotropic medications, and zero history of SI/HI/AVH. This is a free program, but it is a high end program.)  [place] [address], [place] [address] [phone]  [place] [address] [place], [place], [address] [phone] phone  (This is a halfway house for [place], 18+.  Residential is 12 months, and non-residential is 6-12 months depending on progress.)  [facility] [employer] [address] [place], [place], [address] [phone] phone [phone] phone  [facility] halfway house, 9-12 months)  [place], [place]  [address] [phone], [place] [address] [phone]  [facility] (Rehab for men only) [address] [place], [place] [address] [phone]   [place]:  [facility] [place], [place] [phone] Women's [place], [place] [phone]  MEDICAL DETOX/RESIDENTIAL TREATMENT- MEDICAID/IPRS:  [id number] [place], [place] [address] [phone]   RTS  [place] [address] [phone]  [facility] [address] [place], [place] [address] [phone]  [place]. [place], [place] [address] [phone]  [place] [address] [place], [place] [address] [phone]  [place], [place] [address] [phone]  ((For admissions to these three [facility] facilities during weekday days and possibly other times, contact [place], phone: [phone]; fax: [phone]))  [facility] ([place]) [address] [place], [place], [address] [phone] phone  [place] [facility] [place] [address] [place], [place], [address]  [place], [place], [address] [phone] phone [phone] fax  ([address] [place], [place], [place] Medicare and Medicaid, [place], Uninsured/IPRS)  [facility] (detox for men and women) [place], [place] [address] [phone]  MEDICAL DETOX AND RESIDENTIAL TREATMENT - INSURANCE:  [facility] (also offers CD-IOP for graduates of residential program) [place]. [place], [place] [address] [phone]  [facility] (now accepting [place]) [place] [address] [phone]  [facility] (accept [facility] for some services) [address] [place], [place] [address] [phone]  OUTPATIENT  PROGRAMS:  [facility] (ADS) [place], [place] [address] [phone]  ((CD-IOP is currently not operational; Opioid replacement clinic is operational))   [facility] (private insurance) [place]. [employer]. [address] [place], [place] [address] [phone]  ((CD-IOP (afternoon program); individual therapy))   [place] (Medicaid & IPRS for [facility] residents) [place], [place] [address] [phone]  ((CD-IOP; new clients must go through walk-in clinic))   [place] (Medicaid & IPRS for [facility] residents) [place]. [place], [place] [address] [phone]  ((CD-IOP))   [facility] [place] (Medicaid for [facility] and [employer]; some private insurance) [address] [place], [place] [address] [phone]  ((CD-IOP))   [facility] ([place] insurance) [phone] [address] [place], [place] [address] [phone]  ((CD-IOP (morning & evening programs); individual therapy))   HALFWAY HOUSES:  [facility] [phone]  [employer].[email or web address]   OPIOID REPLACEMENT PROVIDERS:  [facility] (ADS) [place], [place] [address] [phone]  [place] [address], [place] [address] [phone]  [facility] [address] [place], [place] [address] [phone]   12 STEP PROGRAMS:  Alcoholics Anonymous of [place] ReportZoo.com.cy  Narcotics Anonymous of [place] GreenScrapbooking.dk  Al-Anon of [place], [place] www.[email or web address]/find-meetings.html  Nar-Anon https://nar-anon.org/find-a-meetin   [facility] [place] (men only at this campus) [place], [place] [address] [phone] ((This programs listed  above have a one-time application fee.))

## 2022-10-17 NOTE — ED Provider Notes (Signed)
Behavioral Health Urgent Care Medical Screening Exam  Patient Name: Calvin [name] MRN: [id number] Date of Evaluation: 10/17/22 Chief Complaint:  requesting detox for marijuana and cocaine use.  Diagnosis:  Final diagnoses:  Substance use disorder  Cocaine use disorder ([place])    History of Present illness: Calvin [name] is a 37 y.o. male patient with a past psychiatric history significant for substance use disorder, cocaine use disorder, alcohol use disorder who presented to the [facility] behavioral health urgent care voluntary requesting cocaine and marijuana detox.  Patient seen and evaluated face-to-face by this provider, chart reviewed and case discussed with Dr. Dwyane Dee. On evaluation, patient is alert and oriented x 4. His thought process is linear and goal oriented. He denies SI/HI/AVH. There is no objective evidence that the patient is currently responding to internal or external stimuli. His speech is clear and coherent at a moderate tone. His mood is euthymic and affect is congruent. He has fair eye contact. He appears well groomed and is casually dressed.  He is calm and cooperative and does not appear to be in acute distress. He states that the last time he was here he transferred to [facility] 28-day program but only stayed 17 days. He states that he left [facility] treatment center last week and relapsed on cocaine and marijuana the next day. He reports using cocaine everyday for the past week, on average a half a gram daily. He states that he last used cocaine yesterday. He reports using cocaine intermittently since his early teens. He reports using marijuana every day for the past week, on average a quarter. He states that he last used marijuana yesterday. He reports using marijuana intermittently since his early teens. He reports drinking alcohol three times in the past week. He states that he last drank alcohol more than 24 hours ago. He denies withdrawal  symptoms. He does not appear to be in severe withdrawal. He denies physical complaints. He resides with this aunt. He is unemployed. He denies legal issues. He is seeking long-term substance abuse treatment at [facility]. I discussed with the patient following up with inpatient residential services for long-term substance abuse treatment. Patient did not want to wait for the CSW to make referrals at 9 am for residential treatment. He states that he will contact the facilities on his own for availability.   [place] ED from 10/17/2022 in [facility] Most recent reading at 10/17/2022  7:23 AM ED from 10/17/2022 in [facility] Emergency Department at [facility] Most recent reading at 10/17/2022 12:34 AM ED from 09/22/2022 in [facility] Most recent reading at 09/22/2022  2:08 PM  C-SSRS RISK CATEGORY No Risk No Risk No Risk       Psychiatric Specialty Exam  Presentation  General Appearance:Appropriate for Environment  Eye Contact:Fair  Speech:Clear and Coherent  Speech Volume:Normal  Handedness:Right   Mood and Affect  Mood: Euphoric  Affect: Congruent   Thought Process  Thought Processes: Coherent; Goal Directed  Descriptions of Associations:Intact  Orientation:Full (Time, Place and Person)  Thought Content:Logical  Diagnosis of Schizophrenia or Schizoaffective disorder in past: No   Hallucinations:None "voices talking about me."  Ideas of Reference:None  Suicidal Thoughts:No  Homicidal Thoughts:No   Sensorium  Memory: Immediate Fair; Recent Fair; Remote Fair  Judgment: Fair  Insight: Fair   Materials engineer: Fair  Attention Span: Fair  Recall: [employer] of Knowledge: Fair  Language:  Fair   Psychomotor Activity  Psychomotor Activity: Normal   Assets  Assets: Communication Skills; Desire for Improvement; Financial Resources/Insurance;  Housing; Leisure Time; Physical Health; Social Support   Sleep  Sleep: Fair  Number of hours:  6   Physical Exam: Physical Exam HENT:     Nose: Nose normal.  Eyes:     Conjunctiva/sclera: Conjunctivae normal.  Cardiovascular:     Rate and Rhythm: Normal rate.  Pulmonary:     Effort: Pulmonary effort is normal.  Musculoskeletal:        General: Normal range of motion.     Cervical back: Normal range of motion.  Neurological:     Mental Status: He is alert and oriented to person, place, and time.    Review of Systems  Constitutional: Negative.   HENT: Negative.    Eyes: Negative.   Respiratory: Negative.    Cardiovascular: Negative.   Gastrointestinal: Negative.   Genitourinary: Negative.   Musculoskeletal: Negative.   Neurological: Negative.   Endo/Heme/Allergies: Negative.   Psychiatric/Behavioral:  Positive for substance abuse.    Blood pressure 117/76, pulse 78, resp. rate 18, SpO2 100 %. There is no height or weight on file to calculate BMI.  Musculoskeletal: Strength & Muscle Tone: within normal limits Gait & Station: normal Patient leans: N/A   [place] MSE Discharge Disposition for Follow up and Recommendations: Based on my evaluation the patient does not appear to have an emergency medical condition and can be discharged with resources and follow up care in outpatient services for Substance Abuse Intensive Outpatient Program and Individual Therapy Patient does not meet criteria for substance abuse detox due to minimal use. Patient recommended to follow up with inpatient residential substance abuse facilities for long-term treatment.  Discharge recommendations:   Outpatient Follow up: Please review list of outpatient resources for psychiatry and counseling. Please follow up with your primary care provider for all medical related needs.   You are encouraged to follow up with [facility] for outpatient treatment.  Walk in/ Open Access  Hours: Monday - Friday 8AM - 11AM (To see provider and therapist) Friday - [place] (To see therapist only)  [facility] [place], [place]  Therapy: We recommend that patient participate in individual therapy to address mental health concerns.  Substance abuse: Please review resource guide for residential substance abuse options.   Safety:   The following safety precautions should be taken:   No sharp objects. This includes scissors, razors, scrapers, and putty knives.   Chemicals should be removed and locked up.   Medications should be removed and locked up.   Weapons should be removed and locked up. This includes firearms, knives and instruments that can be used to cause injury.   The patient should abstain from use of illicit substances/drugs and abuse of any medications.  If symptoms worsen or do not continue to improve or if the patient becomes actively suicidal or homicidal then it is recommended that the patient return to the closest hospital emergency department, the [facility], or call 911 for further evaluation and treatment. National Suicide Prevention Lifeline 1-800-SUICIDE or 782-768-1915.  About 988 988 offers 24/7 access to trained crisis counselors who can help people experiencing mental health-related distress. People can call or text 988 or chat 988lifeline.org for themselves or if they are worried about a loved one who may need crisis support.    Marissa Calamity, NP 10/17/2022, 7:47 AM

## 2022-10-17 NOTE — Discharge Instructions (Addendum)
Discharge recommendations:   Outpatient Follow up: Please review list of outpatient resources for psychiatry and counseling. Please follow up with your primary care provider for all medical related needs.   You are encouraged to follow up with [facility] for outpatient treatment.  Walk in/ Open Access Hours: Monday - Friday 8AM - 11AM (To see provider and therapist) Friday - [place] (To see therapist only)  [facility] [place], [place]  Therapy: We recommend that patient participate in individual therapy to address mental health concerns.  Substance abuse: Please review resource guide for residential substance abuse options.   Safety:   The following safety precautions should be taken:   No sharp objects. This includes scissors, razors, scrapers, and putty knives.   Chemicals should be removed and locked up.   Medications should be removed and locked up.   Weapons should be removed and locked up. This includes firearms, knives and instruments that can be used to cause injury.   The patient should abstain from use of illicit substances/drugs and abuse of any medications.  If symptoms worsen or do not continue to improve or if the patient becomes actively suicidal or homicidal then it is recommended that the patient return to the closest hospital emergency department, the [facility], or call 911 for further evaluation and treatment. National Suicide Prevention Lifeline 1-800-SUICIDE or 8577156725.  About 988 988 offers 24/7 access to trained crisis counselors who can help people experiencing mental health-related distress. People can call or text 988 or chat 988lifeline.org for themselves or if they are worried about a loved one who may need crisis support.

## 2022-10-22 ENCOUNTER — Encounter (HOSPITAL_COMMUNITY): Payer: Self-pay

## 2022-10-22 ENCOUNTER — Emergency Department (HOSPITAL_COMMUNITY)
Admission: EM | Admit: 2022-10-22 | Discharge: 2022-10-22 | Disposition: A | Discharge status: Home / Self Care | Payer: Commercial Managed Care - HMO | Attending: Emergency Medicine | Admitting: Emergency Medicine

## 2022-10-22 DIAGNOSIS — R1084 Generalized abdominal pain: Secondary | ICD-10-CM | POA: Insufficient documentation

## 2022-10-22 DIAGNOSIS — R739 Hyperglycemia, unspecified: Secondary | ICD-10-CM | POA: Insufficient documentation

## 2022-10-22 DIAGNOSIS — R319 Hematuria, unspecified: Secondary | ICD-10-CM | POA: Diagnosis not present

## 2022-10-22 DIAGNOSIS — R1030 Lower abdominal pain, unspecified: Secondary | ICD-10-CM | POA: Diagnosis present

## 2022-10-22 LAB — COMPREHENSIVE METABOLIC PANEL
ALT: 44 U/L (ref 0–44)
AST: 51 U/L — ABNORMAL HIGH (ref 15–41)
Albumin: 4.2 g/dL (ref 3.5–5.0)
Alkaline Phosphatase: 72 U/L (ref 38–126)
Anion gap: 16 — ABNORMAL HIGH (ref 5–15)
BUN: 16 mg/dL (ref 6–20)
CO2: 20 mmol/L — ABNORMAL LOW (ref 22–32)
Calcium: 8.8 mg/dL — ABNORMAL LOW (ref 8.9–10.3)
Chloride: 105 mmol/L (ref 98–111)
Creatinine, Ser: 1.15 mg/dL (ref 0.61–1.24)
GFR, Estimated: >60 mL/min (ref >60)
Glucose, Bld: 68 mg/dL — ABNORMAL LOW (ref 70–99)
Potassium: 3.7 mmol/L (ref 3.5–5.1)
Sodium: 141 mmol/L (ref 135–145)
Total Bilirubin: 0.3 mg/dL (ref 0.3–1.2)
Total Protein: 7.4 g/dL (ref 6.5–8.1)

## 2022-10-22 LAB — RAPID URINE DRUG SCREEN, HOSP PERFORMED
Amphetamines: NOT DETECTED
Barbiturates: NOT DETECTED
Benzodiazepines: NOT DETECTED
Cocaine: POSITIVE — AB
Opiates: NOT DETECTED
Tetrahydrocannabinol: POSITIVE — AB

## 2022-10-22 LAB — CBC
HCT: 41.5 % (ref 39.0–52.0)
Hemoglobin: 13.9 g/dL (ref 13.0–17.0)
MCH: 35.3 pg — ABNORMAL HIGH (ref 26.0–34.0)
MCHC: 33.5 g/dL (ref 30.0–36.0)
MCV: 105.3 fL — ABNORMAL HIGH (ref 80.0–100.0)
Platelets: 235 10*3/uL (ref 150–400)
RBC: 3.94 MIL/uL — ABNORMAL LOW (ref 4.22–5.81)
RDW: 13.1 % (ref 11.5–15.5)
WBC: 8 10*3/uL (ref 4.0–10.5)
nRBC: 0 % (ref 0.0–0.2)

## 2022-10-22 LAB — URINALYSIS, ROUTINE W REFLEX MICROSCOPIC
Bilirubin Urine: NEGATIVE
Glucose, UA: NEGATIVE mg/dL
Ketones, ur: 20 mg/dL — AB
Nitrite: NEGATIVE
Protein, ur: NEGATIVE mg/dL
Specific Gravity, Urine: 1.025 (ref 1.005–1.030)
pH: 5 (ref 5.0–8.0)

## 2022-10-22 LAB — CBG MONITORING, ED
Glucose-Capillary: 57 mg/dL — ABNORMAL LOW (ref 70–99)
Glucose-Capillary: 83 mg/dL (ref 70–99)

## 2022-10-22 LAB — LIPASE, BLOOD: Lipase: 26 U/L (ref 11–51)

## 2022-10-22 MED ORDER — DICYCLOMINE HCL 20 MG PO TABS: 20.0000 mg | ORAL_TABLET | Freq: Three times a day (TID) | ORAL | 0 refills | Status: DC

## 2022-10-22 MED ORDER — FAMOTIDINE 20 MG PO TABS
20.0000 mg | ORAL_TABLET | Freq: Once | ORAL | Status: AC
  Administered 2022-10-22: 20 mg via ORAL
  Filled 2022-10-22: qty 1

## 2022-10-22 MED ORDER — DICYCLOMINE HCL 10 MG PO CAPS
20.0000 mg | ORAL_CAPSULE | Freq: Once | ORAL | Status: AC
  Administered 2022-10-22: 20 mg via ORAL
  Filled 2022-10-22: qty 2

## 2022-10-22 NOTE — ED Provider Notes (Signed)
[facility] Provider Note   CSN: [id number] Arrival date & time: 10/22/22  [id number]     History  Chief Complaint  Patient presents with   Abdominal Pain    Calvin [name] is a 37 y.o. male.   Abdominal Pain  Patient is a 37 year old male presents emergency room today with plaints of lower abdominal pain and diarrhea for 2 weeks.  He has been to the emergency room for similar symptoms before.  He states the symptoms worsened last night.  Initially states he did not use cocaine however on my reassessment patient now indicates that he did use cocaine last night.  He states his symptoms have completely resolved now.  He denies any nausea vomiting or diarrhea.  He denies any black or tarry stool.  He is days he does drink alcohol.  Seems that he did not eat very much food after using cocaine yesterday.     Home Medications Prior to Admission medications   Medication Sig Start Date End Date Taking? Authorizing Provider  dicyclomine (BENTYL) 20 MG tablet Take 1 tablet (20 mg total) by mouth 3 (three) times daily before meals. 10/22/22   Tedd Sias, PA  loperamide (IMODIUM) 2 MG capsule Take 1 capsule (2 mg total) by mouth 4 (four) times daily as needed for diarrhea or loose stools. 10/17/22   Orpah Greek, MD  loratadine (CLARITIN) 10 MG tablet Take 10 mg by mouth daily as needed for allergies.    [provider]  Melatonin 10 MG TABS Take 1 tablet by mouth at bedtime as needed (sleep).    [provider]  nicotine (NICODERM CQ - DOSED IN MG/24 HOURS) 21 mg/24hr patch Place 1 patch (21 mg total) onto the skin daily at 6 (six) AM. Patient not taking: Reported on 10/17/2022 09/23/22   Corky Sox, MD  ondansetron (ZOFRAN-ODT) 4 MG disintegrating tablet 4mg  ODT q4 hours prn nausea/vomit 10/17/22   Pollina, Gwenyth Allegra, MD      Allergies    Tylenol [acetaminophen]    Review of Systems   Review of Systems   Gastrointestinal:  Positive for abdominal pain.    Physical Exam Updated Vital Signs BP 117/67 (BP Location: Right Arm)   Pulse 78   Temp 97.6 F (36.4 C)   Resp 16   SpO2 100%  Physical Exam Vitals and nursing note reviewed.  Constitutional:      General: He is not in acute distress. HENT:     Head: Normocephalic and atraumatic.     Nose: Nose normal.  Eyes:     General: No scleral icterus. Cardiovascular:     Rate and Rhythm: Normal rate and regular rhythm.     Pulses: Normal pulses.     Heart sounds: Normal heart sounds.  Pulmonary:     Effort: Pulmonary effort is normal. No respiratory distress.     Breath sounds: No wheezing.  Abdominal:     Palpations: Abdomen is soft.     Tenderness: There is no abdominal tenderness.  Musculoskeletal:     Cervical back: Normal range of motion.     Right lower leg: No edema.     Left lower leg: No edema.  Skin:    General: Skin is warm and dry.     Capillary Refill: Capillary refill takes less than 2 seconds.  Neurological:     Mental Status: He is alert. Mental status is at baseline.  Psychiatric:  Mood and Affect: Mood normal.        Behavior: Behavior normal.     ED Results / Procedures / Treatments   Labs (all labs ordered are listed, but only abnormal results are displayed) Labs Reviewed  COMPREHENSIVE METABOLIC PANEL - Abnormal; Notable for the following components:      Result Value   CO2 20 (*)    Glucose, Bld 68 (*)    Calcium 8.8 (*)    AST 51 (*)    Anion gap 16 (*)    All other components within normal limits  CBC - Abnormal; Notable for the following components:   RBC 3.94 (*)    MCV 105.3 (*)    MCH 35.3 (*)    All other components within normal limits  URINALYSIS, ROUTINE W REFLEX MICROSCOPIC - Abnormal; Notable for the following components:   Hgb urine dipstick MODERATE (*)    Ketones, ur 20 (*)    Leukocytes,Ua SMALL (*)    Bacteria, UA FEW (*)    All other components within normal  limits  RAPID URINE DRUG SCREEN, HOSP PERFORMED - Abnormal; Notable for the following components:   Cocaine POSITIVE (*)    Tetrahydrocannabinol POSITIVE (*)    All other components within normal limits  CBG MONITORING, ED - Abnormal; Notable for the following components:   Glucose-Capillary 57 (*)    All other components within normal limits  LIPASE, BLOOD    EKG None  Radiology No results found.  Procedures Procedures    Medications Ordered in ED Medications  dicyclomine (BENTYL) capsule 20 mg (20 mg Oral Given 10/22/22 0507)  famotidine (PEPCID) tablet 20 mg (20 mg Oral Given 10/22/22 0507)    ED Course/ Medical Decision Making/ A&P                             Medical Decision Making Amount and/or Complexity of Data Reviewed Labs: ordered.  Risk Prescription drug management.   This patient presents to the ED for concern of Abd pain, this involves a number of treatment options, and is a complaint that carries with it a moderate risk of complications and morbidity. A differential diagnosis was considered for the patient's symptoms which is discussed below:   The causes of generalized abdominal pain include but are not limited to AAA, mesenteric ischemia, appendicitis, diverticulitis, DKA, gastritis, gastroenteritis, AMI, nephrolithiasis, pancreatitis, peritonitis, adrenal insufficiency,lead poisoning, iron toxicity, intestinal ischemia, constipation, UTI,SBO/LBO, splenic rupture, biliary disease, IBD, IBS, PUD, or hepatitis.   Co morbidities: Discussed in HPI   Brief History:  Patient is a 37 year old male presents emergency room today with plaints of lower abdominal pain and diarrhea for 2 weeks.  He has been to the emergency room for similar symptoms before.  He states the symptoms worsened last night.  Initially states he did not use cocaine however on my reassessment patient now indicates that he did use cocaine last night.  He states his symptoms have  completely resolved now.  He denies any nausea vomiting or diarrhea.  He denies any black or tarry stool.  He is days he does drink alcohol.  Seems that he did not eat very much food after using cocaine yesterday.    EMR reviewed including pt PMHx, past surgical history and past visits to ER.   See HPI for more details   Lab Tests:  I ordered and independently interpreted labs. Labs notable for UDS + for cocaine and  THC, CMP 68 gluc / 57 glu  CBC nml UA unremarkable - small hematuria -- PCP FU   Imaging Studies:  No imaging studies ordered for this patient    Cardiac Monitoring:  The patient was maintained on a cardiac monitor.  I personally viewed and interpreted the cardiac monitored which showed an underlying rhythm of: NSR NA   Medicines ordered:  I ordered medication including bentyl/pepcid  for pain Reevaluation of the patient after these medicines showed that the patient resolved I have reviewed the patients home medicines and have made adjustments as needed   Critical Interventions:     Consults/Attending Physician      Reevaluation:  After the interventions noted above I re-evaluated patient and found that they have :resolved   Social Determinants of Health:      Problem List / ED Course:  Abd pain - reassuring workup and exam.  Hypglycemia - re-checking and is eating now - recheck and DC after   Dispostion:  Patient care handed off to [place] -to recheck blood sugar  Final Clinical Impression(s) / ED Diagnoses Final diagnoses:  Generalized abdominal pain    Rx / DC Orders ED Discharge Orders          Ordered    dicyclomine (BENTYL) 20 MG tablet  3 times daily before meals        10/22/22 0706              Tedd Sias, PA 10/22/22 [id number]    Orpah Greek, MD 10/22/22 [phone]

## 2022-10-22 NOTE — ED Notes (Signed)
Sandwich, graham cookies and orange juice given.

## 2022-10-22 NOTE — ED Triage Notes (Addendum)
Pt c/o RLQ pain and diarrhea x 2 weeks; recently evaluated for same; denies N/V, denies urinary symptoms; denies known sick contacts; nothing makes pain better or worse; pain described as "knots"; pt alert, oriented, NAD in triage

## 2022-10-22 NOTE — Discharge Instructions (Addendum)
Please stop using cocaine.  Use Bentyl as needed for abdominal pain, make sure you are eating regular meals, hydrating, resting.  I would ask you to see a primary care doctor.  If you do not have a primary care doctor please follow-up with [facility].

## 2022-10-22 NOTE — ED Provider Notes (Signed)
Patient given in sign out by Pati Gallo, PA-C.  Please review their note for patient HPI, physical exam, workup.  At this time the plan is to hydrate the patient and recheck his blood sugar.  If blood sugar is reasonable patient may be discharged with outpatient follow-up.  Blood sugar is 83 on recheck.  When I went to update the patient about his lab results patient was somnolent but arousable and able to answer my questions thoughtfully.  There is an odor of marijuana in the room.  At this time patient is stable for discharge with outpatient follow-up.  I spoke to patient about the plan as discussed above and importance of following with a primary care provider.  Patient stable for discharge at this time.  Patient verbalized understand this plan.   Chuck Hint, PA-C 10/22/22 [id number]    Orpah Greek, MD 10/23/22 [phone]

## 2022-10-22 NOTE — ED Notes (Signed)
Pt encouraged to drink the rest of orange juice in cup.

## 2022-10-26 ENCOUNTER — Other Ambulatory Visit: Payer: Self-pay

## 2022-10-26 ENCOUNTER — Emergency Department (HOSPITAL_COMMUNITY)
Admission: EM | Admit: 2022-10-26 | Discharge: 2022-10-26 | Disposition: A | Discharge status: Home / Self Care | Payer: Commercial Managed Care - HMO | Attending: Emergency Medicine | Admitting: Emergency Medicine

## 2022-10-26 ENCOUNTER — Emergency Department (HOSPITAL_COMMUNITY): Payer: Commercial Managed Care - HMO

## 2022-10-26 DIAGNOSIS — J45909 Unspecified asthma, uncomplicated: Secondary | ICD-10-CM | POA: Diagnosis not present

## 2022-10-26 DIAGNOSIS — F10920 Alcohol use, unspecified with intoxication, uncomplicated: Secondary | ICD-10-CM | POA: Diagnosis not present

## 2022-10-26 DIAGNOSIS — J020 Streptococcal pharyngitis: Secondary | ICD-10-CM | POA: Diagnosis not present

## 2022-10-26 DIAGNOSIS — R059 Cough, unspecified: Secondary | ICD-10-CM | POA: Diagnosis present

## 2022-10-26 DIAGNOSIS — Z20822 Contact with and (suspected) exposure to covid-19: Secondary | ICD-10-CM | POA: Diagnosis not present

## 2022-10-26 DIAGNOSIS — R197 Diarrhea, unspecified: Secondary | ICD-10-CM | POA: Insufficient documentation

## 2022-10-26 LAB — CBC WITH DIFFERENTIAL/PLATELET
Abs Immature Granulocytes: 0.02 10*3/uL (ref 0.00–0.07)
Basophils Absolute: 0 10*3/uL (ref 0.0–0.1)
Basophils Relative: 0 %
Eosinophils Absolute: 0.2 10*3/uL (ref 0.0–0.5)
Eosinophils Relative: 3 %
HCT: 39.7 % (ref 39.0–52.0)
Hemoglobin: 13.6 g/dL (ref 13.0–17.0)
Immature Granulocytes: 0 %
Lymphocytes Relative: 18 %
Lymphs Abs: 1.2 10*3/uL (ref 0.7–4.0)
MCH: 34.8 pg — ABNORMAL HIGH (ref 26.0–34.0)
MCHC: 34.3 g/dL (ref 30.0–36.0)
MCV: 101.5 fL — ABNORMAL HIGH (ref 80.0–100.0)
Monocytes Absolute: 0.6 10*3/uL (ref 0.1–1.0)
Monocytes Relative: 9 %
Neutro Abs: 4.5 10*3/uL (ref 1.7–7.7)
Neutrophils Relative %: 70 %
Platelets: 204 10*3/uL (ref 150–400)
RBC: 3.91 MIL/uL — ABNORMAL LOW (ref 4.22–5.81)
RDW: 12.6 % (ref 11.5–15.5)
WBC: 6.5 10*3/uL (ref 4.0–10.5)
nRBC: 0 % (ref 0.0–0.2)

## 2022-10-26 LAB — BASIC METABOLIC PANEL
Anion gap: 11 (ref 5–15)
BUN: 10 mg/dL (ref 6–20)
CO2: 24 mmol/L (ref 22–32)
Calcium: 8.7 mg/dL — ABNORMAL LOW (ref 8.9–10.3)
Chloride: 102 mmol/L (ref 98–111)
Creatinine, Ser: 1.08 mg/dL (ref 0.61–1.24)
GFR, Estimated: >60 mL/min (ref >60)
Glucose, Bld: 79 mg/dL (ref 70–99)
Potassium: 3.6 mmol/L (ref 3.5–5.1)
Sodium: 137 mmol/L (ref 135–145)

## 2022-10-26 LAB — GROUP A STREP BY PCR: Group A Strep by PCR: DETECTED — AB

## 2022-10-26 LAB — RESP PANEL BY RT-PCR (RSV, FLU A&B, COVID)  RVPGX2
Influenza A by PCR: NEGATIVE
Influenza B by PCR: NEGATIVE
Resp Syncytial Virus by PCR: NEGATIVE
SARS Coronavirus 2 by RT PCR: NEGATIVE

## 2022-10-26 MED ORDER — AMOXICILLIN 500 MG PO CAPS: 500.0000 mg | ORAL_CAPSULE | Freq: Two times a day (BID) | ORAL | 0 refills | Status: AC

## 2022-10-26 MED ORDER — AMOXICILLIN 500 MG PO CAPS
500.0000 mg | ORAL_CAPSULE | Freq: Once | ORAL | Status: AC
  Administered 2022-10-26: 500 mg via ORAL
  Filled 2022-10-26: qty 1

## 2022-10-26 NOTE — ED Triage Notes (Signed)
PT states sore throat, body aches, abd pain, etc.  VS stable per ems.  Temp 99.0

## 2022-10-26 NOTE — ED Provider Notes (Signed)
[place] EMERGENCY DEPARTMENT AT [facility] Provider Note   CSN: [id number] Arrival date & time: 10/26/22  1617     History  Chief Complaint  Patient presents with   Sore Throat    Calvin [name] is a 37 y.o. male.  With history of polysubstance abuse and asthma who presents to the ED for evaluation of, diarrhea.  He states his symptoms began proximately 1 week ago and have progressively gotten worse.  States it is painful to swallow.  He states his father also has a cough but denies known sick contacts.  His cough is intermittent and nonproductive.  He denies chest pain, shortness of breath, fevers, chills, nausea, vomiting.  He does have intermittent abdominal pain which is typically present when he has episodes of diarrhea. Denies having abdominal pain today.  His diarrhea has been present for quite some time now.  He denies melena or hematochezia.  He states he drank quite a bit last night and also had a shot of liquor prior to arrival.  He endorses smoking marijuana today prior to arrival.  States his last cocaine use was 2 days ago.   Sore Throat       Home Medications Prior to Admission medications   Medication Sig Start Date End Date Taking? Authorizing Provider  amoxicillin (AMOXIL) 500 MG capsule Take 1 capsule (500 mg total) by mouth 2 (two) times daily for 10 days. 10/26/22 11/05/22 Yes Sander Remedios, Edsel Petrin, PA-C  dicyclomine (BENTYL) 20 MG tablet Take 1 tablet (20 mg total) by mouth 3 (three) times daily before meals. 10/22/22   Gailen Shelter, PA  loperamide (IMODIUM) 2 MG capsule Take 1 capsule (2 mg total) by mouth 4 (four) times daily as needed for diarrhea or loose stools. 10/17/22   Gilda Crease, MD  loratadine (CLARITIN) 10 MG tablet Take 10 mg by mouth daily as needed for allergies.    [provider]  Melatonin 10 MG TABS Take 1 tablet by mouth at bedtime as needed (sleep).    [provider]  nicotine (NICODERM CQ - DOSED IN  MG/24 HOURS) 21 mg/24hr patch Place 1 patch (21 mg total) onto the skin daily at 6 (six) AM. Patient not taking: Reported on 10/17/2022 09/23/22   Carlyn Reichert, MD  ondansetron (ZOFRAN-ODT) 4 MG disintegrating tablet 4mg  ODT q4 hours prn nausea/vomit 10/17/22   Pollina, Canary Brim, MD      Allergies    Tylenol [acetaminophen]    Review of Systems   Review of Systems  HENT:  Positive for sore throat.   Respiratory:  Positive for cough.   All other systems reviewed and are negative.   Physical Exam Updated Vital Signs BP 100/67   Pulse 78   Temp 98.1 F (36.7 C) (Oral)   Ht 6\' 1"  (1.854 m)   Wt 65.8 kg   SpO2 97%   BMI 19.14 kg/m  Physical Exam Vitals and nursing note reviewed.  Constitutional:      General: He is not in acute distress.    Appearance: He is well-developed. He is not ill-appearing, toxic-appearing or diaphoretic.     Comments: Resting comfortably in bed.  Strong odor of marijuana.  HENT:     Head: Normocephalic and atraumatic.     Right Ear: Tympanic membrane and ear canal normal.     Left Ear: Tympanic membrane and ear canal normal.     Mouth/Throat:     Mouth: Mucous membranes are moist.  Pharynx: Oropharynx is clear. Uvula midline.     Tonsils: No tonsillar exudate. 1+ on the right. 1+ on the left.     Comments: Significant posterior oropharyngeal erythema.  Uvula midline.  Tonsils 1+ bilaterally.  No RPA or PTA noted.  No exudates. Eyes:     Conjunctiva/sclera: Conjunctivae normal.  Cardiovascular:     Rate and Rhythm: Normal rate and regular rhythm.     Heart sounds: No murmur heard. Pulmonary:     Effort: Pulmonary effort is normal. No respiratory distress.     Breath sounds: Normal breath sounds. No stridor. No wheezing, rhonchi or rales.  Abdominal:     Palpations: Abdomen is soft.     Tenderness: There is no abdominal tenderness. There is no guarding or rebound.  Musculoskeletal:        General: No swelling.     Cervical back: Neck  supple.  Skin:    General: Skin is warm and dry.     Capillary Refill: Capillary refill takes less than 2 seconds.  Neurological:     General: No focal deficit present.     Mental Status: He is alert and oriented to person, place, and time.     Comments: Drowsy but responsive  Psychiatric:        Mood and Affect: Mood normal.     ED Results / Procedures / Treatments   Labs (all labs ordered are listed, but only abnormal results are displayed) Labs Reviewed  GROUP A STREP BY PCR - Abnormal; Notable for the following components:      Result Value   Group A Strep by PCR DETECTED (*)    All other components within normal limits  BASIC METABOLIC PANEL - Abnormal; Notable for the following components:   Calcium 8.7 (*)    All other components within normal limits  CBC WITH DIFFERENTIAL/PLATELET - Abnormal; Notable for the following components:   RBC 3.91 (*)    MCV 101.5 (*)    MCH 34.8 (*)    All other components within normal limits  RESP PANEL BY RT-PCR (RSV, FLU A&B, COVID)  RVPGX2  ETHANOL    EKG None  Radiology DG Chest 2 View  Result Date: 10/26/2022 CLINICAL DATA:  cough EXAM: CHEST - 2 VIEW COMPARISON:  May 09, 2022 FINDINGS: The cardiomediastinal silhouette is normal in contour. No pleural effusion. No pneumothorax. LEFT lung calcified granuloma. No acute pleuroparenchymal abnormality. Visualized abdomen is unremarkable. No acute osseous abnormality noted. IMPRESSION: No acute cardiopulmonary abnormality. Electronically Signed   By: Meda Klinefelter M.D.   On: 10/26/2022 17:22    Procedures Procedures    Medications Ordered in ED Medications  amoxicillin (AMOXIL) capsule 500 mg (has no administration in time range)    ED Course/ Medical Decision Making/ A&P                             Medical Decision Making Amount and/or Complexity of Data Reviewed Labs: ordered. Radiology: ordered.  This patient presents to the ED for concern of sore throat,  cough, body aches, this involves an extensive number of treatment options, and is a complaint that carries with it a high risk of complications and morbidity.  The differential diagnosis includes flu, COVID, RSV, other URI, pneumonia, asthma exacerbation, bronchitis  Co morbidities that complicate the patient evaluation   polysubstance abuse, asthma  My initial workup includes labs, chest x-ray  Additional history obtained from: Nursing notes  from this visit. Previous records within EMR system numerous ED visits for abdominal pain, use, substance use disorder EMS provide a portion of the history  I ordered, reviewed and interpreted labs which include: BMP, CBC, respiratory panel, rapid strep, ethanol.  Strep positive.  Labs otherwise reassuring  I ordered imaging studies including chest x-ray I independently visualized and interpreted imaging which showed normal I agree with the radiologist interpretation  Afebrile, hemodynamically stable.  37 year old male presents to the ED for evaluation of upper respiratory infection type symptoms.  The symptoms have been present for a week.  On my exam, he is drowsy and admits to marijuana and alcohol use today.  He is fully oriented and is in no acute distress.  His oropharynx is erythematous but there is no evidence of PTA or RPA.  He was given the option of Bicillin versus p.o. amoxicillin and requests amoxicillin.  First dose was given in the ED.  Prescription was sent.  This is likely the cause of his symptoms.  He was encouraged to stop smoking, take medicines as prescribed, and use over-the-counter medications as needed.  He was given return precautions.  Stable at discharge.  At this time there does not appear to be any evidence of an acute emergency medical condition and the patient appears stable for discharge with appropriate outpatient follow up. Diagnosis was discussed with patient who verbalizes understanding of care plan and is agreeable to  discharge. I have discussed return precautions with patient who verbalizes understanding. Patient encouraged to follow-up with their PCP within 1 week. All questions answered.  Patient's case discussed with Dr. Charm BargesButler who agrees with plan to discharge with follow-up.   Note: Portions of this report may have been transcribed using voice recognition software. Every effort was made to ensure accuracy; however, inadvertent computerized transcription errors may still be present.        Final Clinical Impression(s) / ED Diagnoses Final diagnoses:  Strep pharyngitis  Alcoholic intoxication without complication    Rx / DC Orders ED Discharge Orders          Ordered    amoxicillin (AMOXIL) 500 MG capsule  2 times daily        10/26/22 2011              Mora BellmanSchutt, Wilmetta Speiser M, PA-C 10/26/22 2013    Terrilee FilesButler, Michael C, MD 10/27/22 [id number]

## 2022-10-26 NOTE — Discharge Instructions (Addendum)
You have been seen today for your complaint of sore throat. Your lab work was positive for strep throat. Your imaging was reassuring and showed no abnormalities. Your discharge medications include amoxicillin. This is an antibiotic. You should take it as prescribed. You should take it for the entire duration of the prescription. This may cause an upset stomach. This is normal. You may take this with food. You may also eat yogurt to prevent diarrhea. Home care instructions are as follows:  Take your medicine as prescribed.  Drink warm tea with honey.  Use throat lozenges. Follow up with: Your primary care provider in 1 week for reevaluation. Please seek immediate medical care if you develop any of the following symptoms: You have new symptoms, such as vomiting, severe headache, stiff or painful neck, chest pain, or shortness of breath. You have severe throat pain, drooling, or changes in your voice. You have swelling of the neck, or the skin on the neck becomes red and tender. You have signs of dehydration, such as tiredness (fatigue), dry mouth, and decreased urination. You become increasingly sleepy, or you cannot wake up completely. Your joints become red or painful. At this time there does not appear to be the presence of an emergent medical condition, however there is always the potential for conditions to change. Please read and follow the below instructions.  Do not take your medicine if  develop an itchy rash, swelling in your mouth or lips, or difficulty breathing; call 911 and seek immediate emergency medical attention if this occurs.  You may review your lab tests and imaging results in their entirety on your MyChart account.  Please discuss all results of fully with your primary care provider and other specialist at your follow-up visit.  Note: Portions of this text may have been transcribed using voice recognition software. Every effort was made to ensure accuracy; however,  inadvertent computerized transcription errors may still be present.

## 2022-10-30 ENCOUNTER — Other Ambulatory Visit (HOSPITAL_COMMUNITY)
Admission: EM | Admit: 2022-10-30 | Discharge: 2022-10-31 | Disposition: A | Discharge status: Home / Self Care | Payer: Commercial Managed Care - HMO | Attending: Psychiatry | Admitting: Psychiatry

## 2022-10-30 DIAGNOSIS — B950 Streptococcus, group A, as the cause of diseases classified elsewhere: Secondary | ICD-10-CM | POA: Insufficient documentation

## 2022-10-30 DIAGNOSIS — F1910 Other psychoactive substance abuse, uncomplicated: Secondary | ICD-10-CM | POA: Insufficient documentation

## 2022-10-30 DIAGNOSIS — F1210 Cannabis abuse, uncomplicated: Secondary | ICD-10-CM | POA: Diagnosis not present

## 2022-10-30 DIAGNOSIS — F1410 Cocaine abuse, uncomplicated: Secondary | ICD-10-CM

## 2022-10-30 DIAGNOSIS — F1010 Alcohol abuse, uncomplicated: Secondary | ICD-10-CM | POA: Insufficient documentation

## 2022-10-30 DIAGNOSIS — Z1152 Encounter for screening for COVID-19: Secondary | ICD-10-CM | POA: Diagnosis not present

## 2022-10-30 LAB — POC SARS CORONAVIRUS 2 AG: SARSCOV2ONAVIRUS 2 AG: NEGATIVE

## 2022-10-30 LAB — SARS CORONAVIRUS 2 BY RT PCR: SARS Coronavirus 2 by RT PCR: NEGATIVE

## 2022-10-30 MED ORDER — OLANZAPINE 5 MG PO TBDP: 5.0000 mg | ORAL_TABLET | Freq: Three times a day (TID) | ORAL | Status: DC | PRN

## 2022-10-30 MED ORDER — LORAZEPAM 1 MG PO TABS
1.0000 mg | ORAL_TABLET | Freq: Four times a day (QID) | ORAL | Status: DC
  Administered 2022-10-30: 1 mg via ORAL
  Filled 2022-10-30 (×2): qty 1

## 2022-10-30 MED ORDER — LORAZEPAM 1 MG PO TABS: 1.0000 mg | ORAL_TABLET | ORAL | Status: DC | PRN

## 2022-10-30 MED ORDER — THIAMINE MONONITRATE 100 MG PO TABS
100.0000 mg | ORAL_TABLET | Freq: Every day | ORAL | Status: DC
  Filled 2022-10-30: qty 1

## 2022-10-30 MED ORDER — HYDROXYZINE HCL 25 MG PO TABS: 25.0000 mg | ORAL_TABLET | Freq: Four times a day (QID) | ORAL | Status: DC | PRN

## 2022-10-30 MED ORDER — LORAZEPAM 1 MG PO TABS: 1.0000 mg | ORAL_TABLET | Freq: Every day | ORAL | Status: DC

## 2022-10-30 MED ORDER — ADULT MULTIVITAMIN W/MINERALS CH
1.0000 | ORAL_TABLET | Freq: Every day | ORAL | Status: DC
  Administered 2022-10-30: 1 via ORAL
  Filled 2022-10-30 (×2): qty 1

## 2022-10-30 MED ORDER — ALUM & MAG HYDROXIDE-SIMETH 200-200-20 MG/5ML PO SUSP: 30.0000 mL | ORAL | Status: DC | PRN

## 2022-10-30 MED ORDER — LORAZEPAM 1 MG PO TABS: 1.0000 mg | ORAL_TABLET | Freq: Two times a day (BID) | ORAL | Status: DC

## 2022-10-30 MED ORDER — LORAZEPAM 1 MG PO TABS: 1.0000 mg | ORAL_TABLET | Freq: Four times a day (QID) | ORAL | Status: DC | PRN

## 2022-10-30 MED ORDER — ZIPRASIDONE MESYLATE 20 MG IM SOLR: 20.0000 mg | INTRAMUSCULAR | Status: DC | PRN

## 2022-10-30 MED ORDER — ONDANSETRON 4 MG PO TBDP: 4.0000 mg | ORAL_TABLET | Freq: Four times a day (QID) | ORAL | Status: DC | PRN

## 2022-10-30 MED ORDER — MAGNESIUM HYDROXIDE 400 MG/5ML PO SUSP: 30.0000 mL | Freq: Every day | ORAL | Status: DC | PRN

## 2022-10-30 MED ORDER — THIAMINE HCL 100 MG/ML IJ SOLN
100.0000 mg | Freq: Once | INTRAMUSCULAR | Status: AC
  Administered 2022-10-30: 100 mg via INTRAMUSCULAR
  Filled 2022-10-30: qty 2

## 2022-10-30 MED ORDER — LOPERAMIDE HCL 2 MG PO CAPS: 2.0000 mg | ORAL_CAPSULE | ORAL | Status: DC | PRN

## 2022-10-30 MED ORDER — LORAZEPAM 1 MG PO TABS: 1.0000 mg | ORAL_TABLET | Freq: Three times a day (TID) | ORAL | Status: DC

## 2022-10-30 NOTE — ED Notes (Signed)
Patient is sleeping. Respirations equal and unlabored, skin warm and dry. No change in assessment or acuity. Routine safety checks conducted according to facility protocol. Will continue to monitor for safety.   
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
## 2022-10-30 NOTE — ED Notes (Signed)
Pt is A & O x 4. Pt is oriented to the unit and provided with meal. Medication has been administered to him and pt is lying on his bed. Pt denies SI/HI/AVH. Will continue to monitor for safety. 

## 2022-10-30 NOTE — Progress Notes (Signed)
   10/30/22 1815  [facility]UC Triage Screening (Walk-ins at [facility] only)  How Did You Hear About [place]? Self  What Is the Reason for Your Visit/Call Today? Calvin [name] is a 37 year old male presenting to [facility] seeking detox. Patient reports he was discharged from [facility] on 3/21 and he relapsed that same week on alcohol, cocaine and THC. Patient reports he drinks a pint or more of liquor a day and reports drinking a pint of liquor about two hours ago. Patient reports using cocaine and THC about two hours ago and states that he uses about a gram of cocaine a day and five or more blunts a day. Patient wishes to detox and possibly discharge to sober living [facility] [place] or an [place] [facility]. Pt denies SI, HI, AVH and does not have any legal issues.  How Long Has This Been Causing You Problems? > than 6 months  Have You Recently Had Any Thoughts About Hurting Yourself? No  Are You Planning to Commit Suicide/Harm Yourself At This time? No  Have you Recently Had Thoughts About Hurting Someone [name]? No  Are You Planning To Harm Someone At This Time? No  Explanation: na  Are you currently experiencing any auditory, visual or other hallucinations? No  Have You Used Any Alcohol or Drugs in the Past 24 Hours? Yes  How long ago did you use Drugs or Alcohol? hour ago  What Did You Use and How Much? THC, cocaine, alcohol  Do you have any current medical co-morbidities that require immediate attention? No  Clinician description of patient physical appearance/behavior: calm  What Do You Feel Would Help You the Most Today? Treatment for Depression or other mood problem;Alcohol or Drug Use Treatment  If access to [facility] was not available, would you have sought care in the Emergency Department? No  Determination of Need Routine (7 days)  Options For Referral Medication Management;Outpatient Therapy

## 2022-10-30 NOTE — ED Provider Notes (Signed)
Facility Based Crisis Admission H&P  Date: 10/31/22 Patient Name: Calvin [name] MRN: [id number] Chief Complaint: relapse on cocaine and alcohol  Diagnoses:  Final diagnoses:  Cocaine abuse  Marijuana abuse  Alcohol abuse    HPI: Calvin [name], 37 y/o male with a history of alcohol abuse, nad substance use disorder, presented to [facility] voluntarily.  According to the patient he relapsed on cocaine, marijuana and alcohol last time he used was prior to coming in today.  According to the patient he was at the treatment center in [place] for 2 weeks.  According to patient's medical record patient was seeing at here at [facility] on 10/17/22.  Patient does have an extensive history of urgent care visits for the same problem.  Patient denies having a psychiatrist or therapist at this time.  According to patient he lives with his aunt patient is unemployed.   Face-to-face observation of patient, patient is alert and oriented x 4, speech is clear but sometimes decreased.  Patient does seem a little bit sleepy that he did not get enough sleep.  Patient is observed in dressed in a hoodie with his hoodie over his head patient had to be reminded to put down the head of the what is so he can see his face.  Patient denies SI, HI, AVH or paranoia.  According to patient he relapsed on cocaine and alcohol he had 1 g of cocaine and like a pint of alcohol prior to coming in and also he smoked marijuana.  At this time patient does not seem to be influenced by external or internal stimuli.  Recommend [facility] admission.  PHQ 2-9:  Flowsheet Row ED from 09/22/2022 in [facility] ED from 09/18/2022 in [facility] ED from 11/05/2021 in [facility]  Thoughts that you would be better off dead, or of hurting yourself in some way Not at all Not at all Not at all  PHQ-9 Total Score 2 12 13        Flowsheet Row ED from 10/30/2022 in [facility] ED from 10/26/2022 in [facility] Emergency Department at [facility] ED from 10/22/2022 in [facility] Emergency Department at [facility]  C-SSRS RISK CATEGORY No Risk No Risk No Risk        Total Time spent with patient: 20 minutes  Musculoskeletal  Strength & Muscle Tone: within normal limits Gait & Station: normal Patient leans: N/A  Psychiatric Specialty Exam  Presentation General Appearance:  Casual  Eye Contact: Fair  Speech: Clear and Coherent  Speech Volume: Decreased  Handedness: Right   Mood and Affect  Mood: Anxious  Affect: Labile   Thought Process  Thought Processes: Coherent  Descriptions of Associations:Circumstantial  Orientation:Full (Time, Place and Person)  Thought Content:WDL  Diagnosis of Schizophrenia or Schizoaffective disorder in past: No   Hallucinations:Hallucinations: None  Ideas of Reference:None  Suicidal Thoughts:Suicidal Thoughts: No  Homicidal Thoughts:Homicidal Thoughts: No   Sensorium  Memory: Immediate Fair  Judgment: Poor  Insight: Poor   Executive Functions  Concentration: Fair  Attention Span: Good  Recall: Fair  Fund of Knowledge: Fair  Language: Fair   Psychomotor Activity  Psychomotor Activity: Psychomotor Activity: Normal   Assets  Assets: Desire for Improvement   Sleep  Sleep: Sleep: Fair Number of Hours of Sleep: 6   Nutritional Assessment (For OBS and FBC admissions only) Has the patient had a weight loss or gain of 10 pounds  or more in the last 3 months?: No Has the patient had a decrease in food intake/or appetite?: No Does the patient have dental problems?: No Does the patient have eating habits or behaviors that may be indicators of an eating disorder including binging or inducing vomiting?: No Has the patient recently lost weight without trying?: 0 Has the patient been eating poorly because of a decreased appetite?:  0 Malnutrition Screening Tool Score: 0    Physical Exam HENT:     Head: Normocephalic.     Nose: Nose normal.  Cardiovascular:     Rate and Rhythm: Normal rate.  Pulmonary:     Effort: Pulmonary effort is normal.  Musculoskeletal:        General: Normal range of motion.     Cervical back: Normal range of motion.  Neurological:     General: No focal deficit present.     Mental Status: He is alert.  Psychiatric:        Mood and Affect: Mood normal.        Behavior: Behavior normal.        Thought Content: Thought content normal.        Judgment: Judgment normal.    Review of Systems  Constitutional: Negative.   HENT: Negative.    Eyes: Negative.   Respiratory: Negative.    Cardiovascular: Negative.   Gastrointestinal: Negative.   Genitourinary: Negative.   Musculoskeletal: Negative.   Skin: Negative.   Neurological: Negative.   Psychiatric/Behavioral:  Positive for substance abuse. The patient is nervous/anxious.     Blood pressure 104/74, pulse 84, temperature 97.8 F (36.6 C), temperature source Oral, resp. rate 17, SpO2 98 %. There is no height or weight on file to calculate BMI.  Past Psychiatric History: Alcohol abuse, polysubstance use  Is the patient at risk to self? No  Has the patient been a risk to self in the past 6 months? No .    Has the patient been a risk to self within the distant past? No   Is the patient a risk to others? No   Has the patient been a risk to others in the past 6 months? No   Has the patient been a risk to others within the distant past? No   Past Medical History: See chart Family History: Unknown Social History: Polysubstance use and alcohol  Last Labs:  Admission on 10/30/2022  Component Date Value Ref Range Status   SARS Coronavirus 2 by RT PCR 10/30/2022 NEGATIVE  NEGATIVE Final   Performed at [facility], [address], [place], [place] [address]   SARSCOV2ONAVIRUS 2 AG 10/30/2022 NEGATIVE  NEGATIVE Final    Comment: (NOTE) SARS-CoV-2 antigen NOT DETECTED.   Negative results are presumptive.  Negative results do not preclude SARS-CoV-2 infection and should not be used as the sole basis for treatment or other patient management decisions, including infection  control decisions, particularly in the presence of clinical signs and  symptoms consistent with COVID-19, or in those who have been in contact with the virus.  Negative results must be combined with clinical observations, patient history, and epidemiological information. The expected result is Negative.  Fact Sheet for Patients: https://www.jennings-kim.com/  Fact Sheet for Healthcare Providers: https://alexander-rogers.biz/  This test is not yet approved or cleared by the [place] FDA and  has been authorized for detection and/or diagnosis of SARS-CoV-2 by FDA under an Emergency Use Authorization (EUA).  This EUA will remain in effect (meaning this test  can be used) for the duration of  the COV                          ID-19 declaration under Section 564(b)(1) of the Act, 21 U.S.C. section 360bbb-3(b)(1), unless the authorization is terminated or revoked sooner.    Admission on 10/26/2022, Discharged on 10/26/2022  Component Date Value Ref Range Status   Sodium 10/26/2022 137  135 - 145 mmol/L Final   Potassium 10/26/2022 3.6  3.5 - 5.1 mmol/L Final   Chloride 10/26/2022 102  98 - 111 mmol/L Final   CO2 10/26/2022 24  22 - 32 mmol/L Final   Glucose, Bld 10/26/2022 79  70 - 99 mg/dL Final   Glucose reference range applies only to samples taken after fasting for at least 8 hours.   BUN 10/26/2022 10  6 - 20 mg/dL Final   Creatinine, Ser 10/26/2022 1.08  0.61 - 1.24 mg/dL Final   Calcium 16/04/9603 8.7 (L)  8.9 - 10.3 mg/dL Final   GFR, Estimated 10/26/2022 >60  >60 mL/min Final   Comment: (NOTE) Calculated using the CKD-EPI Creatinine Equation (2021)    Anion gap 10/26/2022 11  5 - 15 Final    Performed at [facility] Lab, [address], [place], [place] [address]   WBC 10/26/2022 6.5  4.0 - 10.5 K/uL Final   RBC 10/26/2022 3.91 (L)  4.22 - 5.81 MIL/uL Final   Hemoglobin 10/26/2022 13.6  13.0 - 17.0 g/dL Final   HCT 11/91/4782 39.7  39.0 - 52.0 % Final   MCV 10/26/2022 101.5 (H)  80.0 - 100.0 fL Final   MCH 10/26/2022 34.8 (H)  26.0 - 34.0 pg Final   MCHC 10/26/2022 34.3  30.0 - 36.0 g/dL Final   RDW 95/62/1308 12.6  11.5 - 15.5 % Final   Platelets 10/26/2022 204  150 - 400 K/uL Final   nRBC 10/26/2022 0.0  0.0 - 0.2 % Final   Neutrophils Relative % 10/26/2022 70  % Final   Neutro Abs 10/26/2022 4.5  1.7 - 7.7 K/uL Final   Lymphocytes Relative 10/26/2022 18  % Final   Lymphs Abs 10/26/2022 1.2  0.7 - 4.0 K/uL Final   Monocytes Relative 10/26/2022 9  % Final   Monocytes Absolute 10/26/2022 0.6  0.1 - 1.0 K/uL Final   Eosinophils Relative 10/26/2022 3  % Final   Eosinophils Absolute 10/26/2022 0.2  0.0 - 0.5 K/uL Final   Basophils Relative 10/26/2022 0  % Final   Basophils Absolute 10/26/2022 0.0  0.0 - 0.1 K/uL Final   Immature Granulocytes 10/26/2022 0  % Final   Abs Immature Granulocytes 10/26/2022 0.02  0.00 - 0.07 K/uL Final   Performed at [facility], [address], [place], [place] [address]   Group A Strep by PCR 10/26/2022 DETECTED (A)  NOT DETECTED Final   Performed at [facility], [address], [place], [place] [address]   SARS Coronavirus 2 by RT PCR 10/26/2022 NEGATIVE  NEGATIVE Final   Influenza A by PCR 10/26/2022 NEGATIVE  NEGATIVE Final   Influenza B by PCR 10/26/2022 NEGATIVE  NEGATIVE Final   Comment: (NOTE) The Xpert Xpress SARS-CoV-2/FLU/RSV plus assay is intended as an aid in the diagnosis of influenza from Nasopharyngeal swab specimens and should not be used as a sole basis for treatment. Nasal washings and aspirates are unacceptable for Xpert Xpress SARS-CoV-2/FLU/RSV testing.  Fact Sheet for  Patients:  BloggerCourse.com  Fact Sheet for Healthcare Providers: SeriousBroker.it  This test is not yet approved or cleared by the [place] FDA and has been authorized for detection and/or diagnosis of SARS-CoV-2 by FDA under an Emergency Use Authorization (EUA). This EUA will remain in effect (meaning this test can be used) for the duration of the COVID-19 declaration under Section 564(b)(1) of the Act, 21 U.S.C. section 360bbb-3(b)(1), unless the authorization is terminated or revoked.     Resp Syncytial Virus by PCR 10/26/2022 NEGATIVE  NEGATIVE Final   Comment: (NOTE) Fact Sheet for Patients: BloggerCourse.com  Fact Sheet for Healthcare Providers: SeriousBroker.it  This test is not yet approved or cleared by the [place] FDA and has been authorized for detection and/or diagnosis of SARS-CoV-2 by FDA under an Emergency Use Authorization (EUA). This EUA will remain in effect (meaning this test can be used) for the duration of the COVID-19 declaration under Section 564(b)(1) of the Act, 21 U.S.C. section 360bbb-3(b)(1), unless the authorization is terminated or revoked.  Performed at [facility], [address] [place], [place] [address]   Admission on 10/22/2022, Discharged on 10/22/2022  Component Date Value Ref Range Status   Lipase 10/22/2022 26  11 - 51 U/L Final   Performed at [facility], [address], [place], [place] [address]   Sodium 10/22/2022 141  135 - 145 mmol/L Final   Potassium 10/22/2022 3.7  3.5 - 5.1 mmol/L Final   Chloride 10/22/2022 105  98 - 111 mmol/L Final   CO2 10/22/2022 20 (L)  22 - 32 mmol/L Final   Glucose, Bld 10/22/2022 68 (L)  70 - 99 mg/dL Final   Glucose reference range applies only to samples taken after fasting for at least 8 hours.   BUN 10/22/2022 16  6 - 20 mg/dL Final   Creatinine, Ser 10/22/2022 1.15  0.61  - 1.24 mg/dL Final   Calcium 82/95/6213 8.8 (L)  8.9 - 10.3 mg/dL Final   Total Protein 08/65/7846 7.4  6.5 - 8.1 g/dL Final   Albumin 96/29/5284 4.2  3.5 - 5.0 g/dL Final   AST 13/24/4010 51 (H)  15 - 41 U/L Final   ALT 10/22/2022 44  0 - 44 U/L Final   Alkaline Phosphatase 10/22/2022 72  38 - 126 U/L Final   Total Bilirubin 10/22/2022 0.3  0.3 - 1.2 mg/dL Final   GFR, Estimated 10/22/2022 >60  >60 mL/min Final   Comment: (NOTE) Calculated using the CKD-EPI Creatinine Equation (2021)    Anion gap 10/22/2022 16 (H)  5 - 15 Final   Performed at [facility], [address], [place], [place] [address]   WBC 10/22/2022 8.0  4.0 - 10.5 K/uL Final   RBC 10/22/2022 3.94 (L)  4.22 - 5.81 MIL/uL Final   Hemoglobin 10/22/2022 13.9  13.0 - 17.0 g/dL Final   HCT 66/44/0347 41.5  39.0 - 52.0 % Final   MCV 10/22/2022 105.3 (H)  80.0 - 100.0 fL Final   MCH 10/22/2022 35.3 (H)  26.0 - 34.0 pg Final   MCHC 10/22/2022 33.5  30.0 - 36.0 g/dL Final   RDW 42/59/5638 13.1  11.5 - 15.5 % Final   Platelets 10/22/2022 235  150 - 400 K/uL Final   nRBC 10/22/2022 0.0  0.0 - 0.2 % Final   Performed at [facility], [address], [place], [place] [address]   Color, Urine 10/22/2022 YELLOW  YELLOW Final   APPearance 10/22/2022  CLEAR  CLEAR Final   Specific Gravity, Urine 10/22/2022 1.025  1.005 - 1.030 Final   pH 10/22/2022 5.0  5.0 - 8.0 Final   Glucose, UA 10/22/2022 NEGATIVE  NEGATIVE mg/dL Final   Hgb urine dipstick 10/22/2022 MODERATE (A)  NEGATIVE Final   Bilirubin Urine 10/22/2022 NEGATIVE  NEGATIVE Final   Ketones, ur 10/22/2022 20 (A)  NEGATIVE mg/dL Final   Protein, ur 13/24/4010 NEGATIVE  NEGATIVE mg/dL Final   Nitrite 27/25/3664 NEGATIVE  NEGATIVE Final   Leukocytes,Ua 10/22/2022 SMALL (A)  NEGATIVE Final   RBC / HPF 10/22/2022 0-5  0 - 5 RBC/hpf Final   WBC, UA 10/22/2022 21-50  0 - 5 WBC/hpf Final   Bacteria, UA 10/22/2022 FEW (A)  NONE SEEN Final   Squamous Epithelial / HPF  10/22/2022 0-5  0 - 5 /HPF Final   Mucus 10/22/2022 PRESENT   Final   Performed at [facility], [address] [place], [place] [address]   Opiates 10/22/2022 NONE DETECTED  NONE DETECTED Final   Cocaine 10/22/2022 POSITIVE (A)  NONE DETECTED Final   Benzodiazepines 10/22/2022 NONE DETECTED  NONE DETECTED Final   Amphetamines 10/22/2022 NONE DETECTED  NONE DETECTED Final   Tetrahydrocannabinol 10/22/2022 POSITIVE (A)  NONE DETECTED Final   Barbiturates 10/22/2022 NONE DETECTED  NONE DETECTED Final   Comment: (NOTE) DRUG SCREEN FOR MEDICAL PURPOSES ONLY.  IF CONFIRMATION IS NEEDED FOR ANY PURPOSE, NOTIFY LAB WITHIN 5 DAYS.  LOWEST DETECTABLE LIMITS FOR URINE DRUG SCREEN Drug Class                     Cutoff (ng/mL) Amphetamine and metabolites    1000 Barbiturate and metabolites    200 Benzodiazepine                 200 Opiates and metabolites        300 Cocaine and metabolites        300 THC                            50 Performed at [facility], [address], [place], [place] [address]    Glucose-Capillary 10/22/2022 57 (L)  70 - 99 mg/dL Final   Glucose reference range applies only to samples taken after fasting for at least 8 hours.   Glucose-Capillary 10/22/2022 83  70 - 99 mg/dL Final   Glucose reference range applies only to samples taken after fasting for at least 8 hours.   Comment 1 10/22/2022 Document in Chart   Final  Admission on 10/17/2022, Discharged on 10/17/2022  Component Date Value Ref Range Status   WBC 10/17/2022 6.9  4.0 - 10.5 K/uL Final   RBC 10/17/2022 3.79 (L)  4.22 - 5.81 MIL/uL Final   Hemoglobin 10/17/2022 13.1  13.0 - 17.0 g/dL Final   HCT 63/87/5643 39.5  39.0 - 52.0 % Final   MCV 10/17/2022 104.2 (H)  80.0 - 100.0 fL Final   MCH 10/17/2022 34.6 (H)  26.0 - 34.0 pg Final   MCHC 10/17/2022 33.2  30.0 - 36.0 g/dL Final   RDW 32/95/1884 13.1  11.5 - 15.5 % Final   Platelets 10/17/2022 209  150 - 400 K/uL Final   nRBC 10/17/2022 0.0   0.0 - 0.2 % Final   Neutrophils Relative % 10/17/2022 62  % Final   Neutro Abs 10/17/2022 4.2  1.7 - 7.7 K/uL Final  Lymphocytes Relative 10/17/2022 28  % Final   Lymphs Abs 10/17/2022 1.9  0.7 - 4.0 K/uL Final   Monocytes Relative 10/17/2022 8  % Final   Monocytes Absolute 10/17/2022 0.6  0.1 - 1.0 K/uL Final   Eosinophils Relative 10/17/2022 2  % Final   Eosinophils Absolute 10/17/2022 0.1  0.0 - 0.5 K/uL Final   Basophils Relative 10/17/2022 0  % Final   Basophils Absolute 10/17/2022 0.0  0.0 - 0.1 K/uL Final   Immature Granulocytes 10/17/2022 0  % Final   Abs Immature Granulocytes 10/17/2022 0.01  0.00 - 0.07 K/uL Final   Performed at [facility], [address], [place], [place] [address]   Sodium 10/17/2022 136  135 - 145 mmol/L Final   Potassium 10/17/2022 3.5  3.5 - 5.1 mmol/L Final   Chloride 10/17/2022 105  98 - 111 mmol/L Final   CO2 10/17/2022 23  22 - 32 mmol/L Final   Glucose, Bld 10/17/2022 81  70 - 99 mg/dL Final   Glucose reference range applies only to samples taken after fasting for at least 8 hours.   BUN 10/17/2022 9  6 - 20 mg/dL Final   Creatinine, Ser 10/17/2022 0.88  0.61 - 1.24 mg/dL Final   Calcium 60/45/4098 8.6 (L)  8.9 - 10.3 mg/dL Final   Total Protein 11/91/4782 7.2  6.5 - 8.1 g/dL Final   Albumin 95/62/1308 4.3  3.5 - 5.0 g/dL Final   AST 65/78/4696 45 (H)  15 - 41 U/L Final   ALT 10/17/2022 70 (H)  0 - 44 U/L Final   Alkaline Phosphatase 10/17/2022 68  38 - 126 U/L Final   Total Bilirubin 10/17/2022 1.3 (H)  0.3 - 1.2 mg/dL Final   GFR, Estimated 10/17/2022 >60  >60 mL/min Final   Comment: (NOTE) Calculated using the CKD-EPI Creatinine Equation (2021)    Anion gap 10/17/2022 8  5 - 15 Final   Performed at [facility], [address], [place], [place] [address]   Lipase 10/17/2022 27  11 - 51 U/L Final   Performed at [facility], [address] [place], [place] [address]   Lactic Acid,  Venous 10/17/2022 0.8  0.5 - 1.9 mmol/L Final   Performed at [facility], [address] [place], [place] [address]   Alcohol, Ethyl (B) 10/17/2022 <10  <10 mg/dL Final   Comment: (NOTE) Lowest detectable limit for serum alcohol is 10 mg/dL.  For medical purposes only. Performed at [facility], [address], [place], [place] [address]   Admission on 09/22/2022, Discharged on 09/22/2022  Component Date Value Ref Range Status   WBC 09/22/2022 10.1  4.0 - 10.5 K/uL Final   RBC 09/22/2022 3.89 (L)  4.22 - 5.81 MIL/uL Final   Hemoglobin 09/22/2022 13.6  13.0 - 17.0 g/dL Final   HCT 64/40/3474 39.9  39.0 - 52.0 % Final   MCV 09/22/2022 102.6 (H)  80.0 - 100.0 fL Final   MCH 09/22/2022 35.0 (H)  26.0 - 34.0 pg Final   MCHC 09/22/2022 34.1  30.0 - 36.0 g/dL Final   RDW 25/95/6387 12.2  11.5 - 15.5 % Final   Platelets 09/22/2022 186  150 - 400 K/uL Final   nRBC 09/22/2022 0.0  0.0 - 0.2 % Final   Neutrophils Relative % 09/22/2022 83  % Final   Neutro Abs 09/22/2022 8.3 (H)  1.7 - 7.7 K/uL Final   Lymphocytes Relative 09/22/2022  12  % Final   Lymphs Abs 09/22/2022 1.2  0.7 - 4.0 K/uL Final   Monocytes Relative 09/22/2022 5  % Final   Monocytes Absolute 09/22/2022 0.5  0.1 - 1.0 K/uL Final   Eosinophils Relative 09/22/2022 0  % Final   Eosinophils Absolute 09/22/2022 0.0  0.0 - 0.5 K/uL Final   Basophils Relative 09/22/2022 0  % Final   Basophils Absolute 09/22/2022 0.0  0.0 - 0.1 K/uL Final   Immature Granulocytes 09/22/2022 0  % Final   Abs Immature Granulocytes 09/22/2022 0.04  0.00 - 0.07 K/uL Final   Performed at [facility], [address] [place], [place] [address]   Sodium 09/22/2022 135  135 - 145 mmol/L Final   Potassium 09/22/2022 4.2  3.5 - 5.1 mmol/L Final   Chloride 09/22/2022 98  98 - 111 mmol/L Final   CO2 09/22/2022 22  22 - 32 mmol/L Final   Glucose, Bld 09/22/2022 155 (H)  70 - 99 mg/dL Final   Glucose reference range  applies only to samples taken after fasting for at least 8 hours.   BUN 09/22/2022 16  6 - 20 mg/dL Final   Creatinine, Ser 09/22/2022 1.23  0.61 - 1.24 mg/dL Final   Calcium 60/45/4098 8.5 (L)  8.9 - 10.3 mg/dL Final   Total Protein 11/91/4782 7.0  6.5 - 8.1 g/dL Final   Albumin 95/62/1308 4.1  3.5 - 5.0 g/dL Final   AST 65/78/4696 39  15 - 41 U/L Final   ALT 09/22/2022 20  0 - 44 U/L Final   Alkaline Phosphatase 09/22/2022 66  38 - 126 U/L Final   Total Bilirubin 09/22/2022 0.6  0.3 - 1.2 mg/dL Final   GFR, Estimated 09/22/2022 >60  >60 mL/min Final   Comment: (NOTE) Calculated using the CKD-EPI Creatinine Equation (2021)    Anion gap 09/22/2022 15  5 - 15 Final   Performed at [facility], [address] [place], [place] [address]   Alcohol, Ethyl (B) 09/22/2022 69 (H)  <10 mg/dL Final   Comment: (NOTE) Lowest detectable limit for serum alcohol is 10 mg/dL.  For medical purposes only. Performed at [facility], [address], [place], [place] [address]    Opiates 09/22/2022 NONE DETECTED  NONE DETECTED Final   Cocaine 09/22/2022 POSITIVE (A)  NONE DETECTED Final   Benzodiazepines 09/22/2022 NONE DETECTED  NONE DETECTED Final   Amphetamines 09/22/2022 POSITIVE (A)  NONE DETECTED Final   Tetrahydrocannabinol 09/22/2022 POSITIVE (A)  NONE DETECTED Final   Barbiturates 09/22/2022 NONE DETECTED  NONE DETECTED Final   Comment: (NOTE) DRUG SCREEN FOR MEDICAL PURPOSES ONLY.  IF CONFIRMATION IS NEEDED FOR ANY PURPOSE, NOTIFY LAB WITHIN 5 DAYS.  LOWEST DETECTABLE LIMITS FOR URINE DRUG SCREEN Drug Class                     Cutoff (ng/mL) Amphetamine and metabolites    1000 Barbiturate and metabolites    200 Benzodiazepine                 200 Opiates and metabolites        300 Cocaine and metabolites        300 THC                            50 Performed at [facility], [address] [place], [place] [address]  Acetaminophen (Tylenol), Serum 09/22/2022  <10 (L)  10 - 30 ug/mL Final   Comment: (NOTE) Therapeutic concentrations vary significantly. A range of 10-30 ug/mL  may be an effective concentration for many patients. However, some  are best treated at concentrations outside of this range. Acetaminophen concentrations >150 ug/mL at 4 hours after ingestion  and >50 ug/mL at 12 hours after ingestion are often associated with  toxic reactions.  Performed at [facility], [address] [place], [place] [address]    Salicylate Lvl 09/22/2022 <7.0 (L)  7.0 - 30.0 mg/dL Final   Performed at [facility], [address], [place], [place] [address]   SARS Coronavirus 2 by RT PCR 09/22/2022 NEGATIVE  NEGATIVE Final   Influenza A by PCR 09/22/2022 NEGATIVE  NEGATIVE Final   Influenza B by PCR 09/22/2022 NEGATIVE  NEGATIVE Final   Comment: (NOTE) The Xpert Xpress SARS-CoV-2/FLU/RSV plus assay is intended as an aid in the diagnosis of influenza from Nasopharyngeal swab specimens and should not be used as a sole basis for treatment. Nasal washings and aspirates are unacceptable for Xpert Xpress SARS-CoV-2/FLU/RSV testing.  Fact Sheet for Patients: BloggerCourse.com  Fact Sheet for Healthcare Providers: SeriousBroker.it  This test is not yet approved or cleared by the [place] FDA and has been authorized for detection and/or diagnosis of SARS-CoV-2 by FDA under an Emergency Use Authorization (EUA). This EUA will remain in effect (meaning this test can be used) for the duration of the COVID-19 declaration under Section 564(b)(1) of the Act, 21 U.S.C. section 360bbb-3(b)(1), unless the authorization is terminated or revoked.     Resp Syncytial Virus by PCR 09/22/2022 NEGATIVE  NEGATIVE Final   Comment: (NOTE) Fact Sheet for Patients: BloggerCourse.com  Fact Sheet for Healthcare Providers: SeriousBroker.it  This test  is not yet approved or cleared by the [place] FDA and has been authorized for detection and/or diagnosis of SARS-CoV-2 by FDA under an Emergency Use Authorization (EUA). This EUA will remain in effect (meaning this test can be used) for the duration of the COVID-19 declaration under Section 564(b)(1) of the Act, 21 U.S.C. section 360bbb-3(b)(1), unless the authorization is terminated or revoked.  Performed at [facility], [address], [place], [place] [address]   Admission on 09/18/2022, Discharged on 09/20/2022  Component Date Value Ref Range Status   SARS Coronavirus 2 by RT PCR 09/18/2022 NEGATIVE  NEGATIVE Final   Influenza A by PCR 09/18/2022 NEGATIVE  NEGATIVE Final   Influenza B by PCR 09/18/2022 NEGATIVE  NEGATIVE Final   Comment: (NOTE) The Xpert Xpress SARS-CoV-2/FLU/RSV plus assay is intended as an aid in the diagnosis of influenza from Nasopharyngeal swab specimens and should not be used as a sole basis for treatment. Nasal washings and aspirates are unacceptable for Xpert Xpress SARS-CoV-2/FLU/RSV testing.  Fact Sheet for Patients: BloggerCourse.com  Fact Sheet for Healthcare Providers: SeriousBroker.it  This test is not yet approved or cleared by the [place] FDA and has been authorized for detection and/or diagnosis of SARS-CoV-2 by FDA under an Emergency Use Authorization (EUA). This EUA will remain in effect (meaning this test can be used) for the duration of the COVID-19 declaration under Section 564(b)(1) of the Act, 21 U.S.C. section 360bbb-3(b)(1), unless the authorization is terminated or revoked.     Resp Syncytial Virus by PCR 09/18/2022 NEGATIVE  NEGATIVE Final   Comment: (NOTE) Fact Sheet for Patients: BloggerCourse.com  Fact Sheet for Healthcare Providers: SeriousBroker.it  This test  is not yet approved or cleared by the [place] and has been authorized for detection and/or diagnosis of SARS-CoV-2 by FDA under an Emergency Use Authorization (EUA). This EUA will remain in effect (meaning this test can be used) for the duration of the COVID-19 declaration under Section 564(b)(1) of the Act, 21 U.S.C. section 360bbb-3(b)(1), unless the authorization is terminated or revoked.  Performed at [facility], [address], [place], [place] [address]    WBC 09/18/2022 6.7  4.0 - 10.5 K/uL Final   RBC 09/18/2022 4.42  4.22 - 5.81 MIL/uL Final   Hemoglobin 09/18/2022 15.4  13.0 - 17.0 g/dL Final   HCT 60/45/4098 44.6  39.0 - 52.0 % Final   MCV 09/18/2022 100.9 (H)  80.0 - 100.0 fL Final   MCH 09/18/2022 34.8 (H)  26.0 - 34.0 pg Final   MCHC 09/18/2022 34.5  30.0 - 36.0 g/dL Final   RDW 11/91/4782 11.9  11.5 - 15.5 % Final   Platelets 09/18/2022 219  150 - 400 K/uL Final   nRBC 09/18/2022 0.0  0.0 - 0.2 % Final   Neutrophils Relative % 09/18/2022 53  % Final   Neutro Abs 09/18/2022 3.5  1.7 - 7.7 K/uL Final   Lymphocytes Relative 09/18/2022 39  % Final   Lymphs Abs 09/18/2022 2.6  0.7 - 4.0 K/uL Final   Monocytes Relative 09/18/2022 7  % Final   Monocytes Absolute 09/18/2022 0.5  0.1 - 1.0 K/uL Final   Eosinophils Relative 09/18/2022 1  % Final   Eosinophils Absolute 09/18/2022 0.1  0.0 - 0.5 K/uL Final   Basophils Relative 09/18/2022 0  % Final   Basophils Absolute 09/18/2022 0.0  0.0 - 0.1 K/uL Final   Immature Granulocytes 09/18/2022 0  % Final   Abs Immature Granulocytes 09/18/2022 0.01  0.00 - 0.07 K/uL Final   Performed at [facility], [address], [place], [place] [address]   Sodium 09/18/2022 139  135 - 145 mmol/L Final   Potassium 09/18/2022 4.0  3.5 - 5.1 mmol/L Final   Chloride 09/18/2022 99  98 - 111 mmol/L Final   CO2 09/18/2022 27  22 - 32 mmol/L Final   Glucose, Bld 09/18/2022 62 (L)  70 - 99 mg/dL Final   Glucose reference range applies only to samples taken after fasting  for at least 8 hours.   BUN 09/18/2022 9  6 - 20 mg/dL Final   Creatinine, Ser 09/18/2022 0.97  0.61 - 1.24 mg/dL Final   Calcium 30/86/5784 9.4  8.9 - 10.3 mg/dL Final   Total Protein 69/62/9528 8.2 (H)  6.5 - 8.1 g/dL Final   Albumin 41/32/4401 4.4  3.5 - 5.0 g/dL Final   AST 02/72/5366 34  15 - 41 U/L Final   ALT 09/18/2022 23  0 - 44 U/L Final   Alkaline Phosphatase 09/18/2022 67  38 - 126 U/L Final   Total Bilirubin 09/18/2022 0.6  0.3 - 1.2 mg/dL Final   GFR, Estimated 09/18/2022 >60  >60 mL/min Final   Comment: (NOTE) Calculated using the CKD-EPI Creatinine Equation (2021)    Anion gap 09/18/2022 13  5 - 15 Final   Performed at [facility], [address] [place], [place] [address]   Hgb A1c MFr Bld 09/18/2022 4.9  4.8 - 5.6 % Final   Comment: (NOTE)         Prediabetes: 5.7 - 6.4  Diabetes: >6.4         Glycemic control for adults with diabetes: <7.0    Mean Plasma Glucose 09/18/2022 94  mg/dL Final   Comment: (NOTE) Performed At: [facility] [address] [place], [place] [id number] Jolene Schimke MD OZ:[id number]    Cholesterol 09/18/2022 135  0 - 200 mg/dL Final   Triglycerides 62/95/2841 72  <150 mg/dL Final   HDL 32/44/0102 72  >40 mg/dL Final   Total CHOL/HDL Ratio 09/18/2022 1.9  RATIO Final   VLDL 09/18/2022 14  0 - 40 mg/dL Final   LDL Cholesterol 09/18/2022 49  0 - 99 mg/dL Final   Comment:        Total Cholesterol/HDL:CHD Risk Coronary Heart Disease Risk Table                     Men   Women  1/2 Average Risk   3.4   3.3  Average Risk       5.0   4.4  2 X Average Risk   9.6   7.1  3 X Average Risk  23.4   11.0        Use the calculated Patient Ratio above and the CHD Risk Table to determine the patient's CHD Risk.        ATP III CLASSIFICATION (LDL):  <100     mg/dL   Optimal  725-366  mg/dL   Near or Above                    Optimal  130-159  mg/dL   Borderline  440-347  mg/dL   High  >425     mg/dL   Very  High Performed at [facility], [address], [place], [place] [address]    TSH 09/18/2022 1.267  0.350 - 4.500 uIU/mL Final   Comment: Performed by a 3rd Generation assay with a functional sensitivity of <=0.01 uIU/mL. Performed at [facility], [address], [place], [place] [address]    POC Amphetamine UR 09/18/2022 None Detected  NONE DETECTED (Cut Off Level 1000 ng/mL) Preliminary   POC Secobarbital (BAR) 09/18/2022 None Detected  NONE DETECTED (Cut Off Level 300 ng/mL) Preliminary   POC Buprenorphine (BUP) 09/18/2022 None Detected  NONE DETECTED (Cut Off Level 10 ng/mL) Preliminary   POC Oxazepam (BZO) 09/18/2022 None Detected  NONE DETECTED (Cut Off Level 300 ng/mL) Preliminary   POC Cocaine UR 09/18/2022 None Detected  NONE DETECTED (Cut Off Level 300 ng/mL) Preliminary   POC Methamphetamine UR 09/18/2022 Positive (A)  NONE DETECTED (Cut Off Level 1000 ng/mL) Preliminary   POC Morphine 09/18/2022 None Detected  NONE DETECTED (Cut Off Level 300 ng/mL) Preliminary   POC Methadone UR 09/18/2022 None Detected  NONE DETECTED (Cut Off Level 300 ng/mL) Preliminary   POC Oxycodone UR 09/18/2022 Positive (A)  NONE DETECTED (Cut Off Level 100 ng/mL) Preliminary   POC Marijuana UR 09/18/2022 Positive (A)  NONE DETECTED (Cut Off Level 50 ng/mL) Preliminary   SARSCOV2ONAVIRUS 2 AG 09/18/2022 NEGATIVE  NEGATIVE Final   Comment: (NOTE) SARS-CoV-2 antigen NOT DETECTED.   Negative results are presumptive.  Negative results do not preclude SARS-CoV-2 infection and should not be used as the sole basis for treatment or other patient management decisions, including infection  control decisions, particularly in the presence of clinical signs and  symptoms consistent with COVID-19, or in those who have been in contact with the virus.  Negative results  must be combined with clinical observations, patient history, and epidemiological information. The expected result is Negative.  Fact  Sheet for Patients: https://www.jennings-kim.com/https://www.fda.gov/media/141569/download  Fact Sheet for Healthcare Providers: https://alexander-rogers.biz/https://www.fda.gov/media/141568/download  This test is not yet approved or cleared by the [place] FDA and  has been authorized for detection and/or diagnosis of SARS-CoV-2 by FDA under an Emergency Use Authorization (EUA).  This EUA will remain in effect (meaning this test can be used) for the duration of  the COV                          ID-19 declaration under Section 564(b)(1) of the Act, 21 U.S.C. section 360bbb-3(b)(1), unless the authorization is terminated or revoked sooner.    Admission on 08/05/2022, Discharged on 08/06/2022  Component Date Value Ref Range Status   Lipase 08/05/2022 49  11 - 51 U/L Final   Performed at [facility], [address] [place], [place] [address]   Sodium 08/05/2022 136  135 - 145 mmol/L Final   Potassium 08/05/2022 3.9  3.5 - 5.1 mmol/L Final   Chloride 08/05/2022 102  98 - 111 mmol/L Final   CO2 08/05/2022 26  22 - 32 mmol/L Final   Glucose, Bld 08/05/2022 145 (H)  70 - 99 mg/dL Final   Glucose reference range applies only to samples taken after fasting for at least 8 hours.   BUN 08/05/2022 8  6 - 20 mg/dL Final   Creatinine, Ser 08/05/2022 1.01  0.61 - 1.24 mg/dL Final   Calcium 11/91/478201/16/2024 9.0  8.9 - 10.3 mg/dL Final   Total Protein 95/62/130801/16/2024 7.1  6.5 - 8.1 g/dL Final   Albumin 65/78/469601/16/2024 3.9  3.5 - 5.0 g/dL Final   AST 29/52/841301/16/2024 27  15 - 41 U/L Final   ALT 08/05/2022 15  0 - 44 U/L Final   Alkaline Phosphatase 08/05/2022 64  38 - 126 U/L Final   Total Bilirubin 08/05/2022 0.5  0.3 - 1.2 mg/dL Final   GFR, Estimated 08/05/2022 >60  >60 mL/min Final   Comment: (NOTE) Calculated using the CKD-EPI Creatinine Equation (2021)    Anion gap 08/05/2022 8  5 - 15 Final   Performed at [facility], [address] [place], [place] [address]   WBC 08/05/2022 6.7  4.0 - 10.5 K/uL Final   RBC 08/05/2022 4.25  4.22 - 5.81 MIL/uL  Final   Hemoglobin 08/05/2022 14.7  13.0 - 17.0 g/dL Final   HCT 02/72/536601/16/2024 44.2  39.0 - 52.0 % Final   MCV 08/05/2022 104.0 (H)  80.0 - 100.0 fL Final   MCH 08/05/2022 34.6 (H)  26.0 - 34.0 pg Final   MCHC 08/05/2022 33.3  30.0 - 36.0 g/dL Final   RDW 44/03/474201/16/2024 11.9  11.5 - 15.5 % Final   Platelets 08/05/2022 209  150 - 400 K/uL Final   nRBC 08/05/2022 0.0  0.0 - 0.2 % Final   Performed at [facility], [address] [place], [place] [address]   Color, Urine 08/05/2022 YELLOW  YELLOW Final   APPearance 08/05/2022 CLEAR  CLEAR Final   Specific Gravity, Urine 08/05/2022 1.025  1.005 - 1.030 Final   pH 08/05/2022 6.0  5.0 - 8.0 Final   Glucose, UA 08/05/2022 50 (A)  NEGATIVE mg/dL Final   Hgb urine dipstick 08/05/2022 SMALL (A)  NEGATIVE Final   Bilirubin Urine 08/05/2022 NEGATIVE  NEGATIVE Final   Ketones, ur 08/05/2022 NEGATIVE  NEGATIVE mg/dL Final   Protein, ur 16/04/9603 30 (A)  NEGATIVE mg/dL Final   Nitrite 54/03/8118 NEGATIVE  NEGATIVE Final   Leukocytes,Ua 08/05/2022 SMALL (A)  NEGATIVE Final   RBC / HPF 08/05/2022 6-10  0 - 5 RBC/hpf Final   WBC, UA 08/05/2022 11-20  0 - 5 WBC/hpf Final   Bacteria, UA 08/05/2022 RARE (A)  NONE SEEN Final   Squamous Epithelial / HPF 08/05/2022 0-5  0 - 5 /HPF Final   Mucus 08/05/2022 PRESENT   Final   Hyaline Casts, UA 08/05/2022 PRESENT   Final   Performed at [facility], [address], [place], [place] [address]  Admission on 07/05/2022, Discharged on 07/06/2022  Component Date Value Ref Range Status   SARS Coronavirus 2 by RT PCR 07/05/2022 NEGATIVE  NEGATIVE Final   Comment: (NOTE) SARS-CoV-2 target nucleic acids are NOT DETECTED.  The SARS-CoV-2 RNA is generally detectable in upper respiratory specimens during the acute phase of infection. The lowest concentration of SARS-CoV-2 viral copies this assay can detect is 138 copies/mL. A negative result does not preclude SARS-Cov-2 infection and should not be used as the  sole basis for treatment or other patient management decisions. A negative result may occur with  improper specimen collection/handling, submission of specimen other than nasopharyngeal swab, presence of viral mutation(s) within the areas targeted by this assay, and inadequate number of viral copies(<138 copies/mL). A negative result must be combined with clinical observations, patient history, and epidemiological information. The expected result is Negative.  Fact Sheet for Patients:  BloggerCourse.com  Fact Sheet for Healthcare Providers:  SeriousBroker.it  This test is no                          t yet approved or cleared by the [place] FDA and  has been authorized for detection and/or diagnosis of SARS-CoV-2 by FDA under an Emergency Use Authorization (EUA). This EUA will remain  in effect (meaning this test can be used) for the duration of the COVID-19 declaration under Section 564(b)(1) of the Act, 21 U.S.C.section 360bbb-3(b)(1), unless the authorization is terminated  or revoked sooner.       Influenza A by PCR 07/05/2022 NEGATIVE  NEGATIVE Final   Influenza B by PCR 07/05/2022 NEGATIVE  NEGATIVE Final   Comment: (NOTE) The Xpert Xpress SARS-CoV-2/FLU/RSV plus assay is intended as an aid in the diagnosis of influenza from Nasopharyngeal swab specimens and should not be used as a sole basis for treatment. Nasal washings and aspirates are unacceptable for Xpert Xpress SARS-CoV-2/FLU/RSV testing.  Fact Sheet for Patients: BloggerCourse.com  Fact Sheet for Healthcare Providers: SeriousBroker.it  This test is not yet approved or cleared by the [place] FDA and has been authorized for detection and/or diagnosis of SARS-CoV-2 by FDA under an Emergency Use Authorization (EUA). This EUA will remain in effect (meaning this test can be used) for the duration of  the COVID-19 declaration under Section 564(b)(1) of the Act, 21 U.S.C. section 360bbb-3(b)(1), unless the authorization is terminated or revoked.     Resp Syncytial Virus by PCR 07/05/2022 NEGATIVE  NEGATIVE Final   Comment: (NOTE) Fact Sheet for Patients: BloggerCourse.com  Fact Sheet for Healthcare Providers: SeriousBroker.it  This test is not yet approved or cleared by the [place] FDA and has been authorized for detection and/or diagnosis of SARS-CoV-2 by FDA under an Emergency Use Authorization (EUA). This EUA will remain in effect (meaning this test can  be used) for the duration of the COVID-19 declaration under Section 564(b)(1) of the Act, 21 U.S.C. section 360bbb-3(b)(1), unless the authorization is terminated or revoked.  Performed at [facility], [address] [place], [place] [address]    WBC 07/05/2022 6.9  4.0 - 10.5 K/uL Final   RBC 07/05/2022 3.75 (L)  4.22 - 5.81 MIL/uL Final   Hemoglobin 07/05/2022 13.3  13.0 - 17.0 g/dL Final   HCT 60/45/4098 37.9 (L)  39.0 - 52.0 % Final   MCV 07/05/2022 101.1 (H)  80.0 - 100.0 fL Final   MCH 07/05/2022 35.5 (H)  26.0 - 34.0 pg Final   MCHC 07/05/2022 35.1  30.0 - 36.0 g/dL Final   RDW 11/91/4782 12.4  11.5 - 15.5 % Final   Platelets 07/05/2022 199  150 - 400 K/uL Final   nRBC 07/05/2022 0.0  0.0 - 0.2 % Final   Neutrophils Relative % 07/05/2022 47  % Final   Neutro Abs 07/05/2022 3.2  1.7 - 7.7 K/uL Final   Lymphocytes Relative 07/05/2022 42  % Final   Lymphs Abs 07/05/2022 2.9  0.7 - 4.0 K/uL Final   Monocytes Relative 07/05/2022 9  % Final   Monocytes Absolute 07/05/2022 0.7  0.1 - 1.0 K/uL Final   Eosinophils Relative 07/05/2022 1  % Final   Eosinophils Absolute 07/05/2022 0.1  0.0 - 0.5 K/uL Final   Basophils Relative 07/05/2022 1  % Final   Basophils Absolute 07/05/2022 0.1  0.0 - 0.1 K/uL Final   Immature Granulocytes 07/05/2022 0  % Final   Abs  Immature Granulocytes 07/05/2022 0.01  0.00 - 0.07 K/uL Final   Performed at [facility], [address], [place], [place] [address]   Sodium 07/05/2022 140  135 - 145 mmol/L Final   Potassium 07/05/2022 3.7  3.5 - 5.1 mmol/L Final   Chloride 07/05/2022 102  98 - 111 mmol/L Final   CO2 07/05/2022 27  22 - 32 mmol/L Final   Glucose, Bld 07/05/2022 97  70 - 99 mg/dL Final   Glucose reference range applies only to samples taken after fasting for at least 8 hours.   BUN 07/05/2022 9  6 - 20 mg/dL Final   Creatinine, Ser 07/05/2022 1.13  0.61 - 1.24 mg/dL Final   Calcium 30/86/5784 9.3  8.9 - 10.3 mg/dL Final   Total Protein 69/62/9528 7.0  6.5 - 8.1 g/dL Final   Albumin 41/32/4401 4.1  3.5 - 5.0 g/dL Final   AST 02/72/5366 36  15 - 41 U/L Final   ALT 07/05/2022 22  0 - 44 U/L Final   Alkaline Phosphatase 07/05/2022 57  38 - 126 U/L Final   Total Bilirubin 07/05/2022 0.4  0.3 - 1.2 mg/dL Final   GFR, Estimated 07/05/2022 >60  >60 mL/min Final   Comment: (NOTE) Calculated using the CKD-EPI Creatinine Equation (2021)    Anion gap 07/05/2022 11  5 - 15 Final   Performed at [facility], [address], [place], [place] [address]   Hgb A1c MFr Bld 07/05/2022 4.9  4.8 - 5.6 % Final   Comment: (NOTE)         Prediabetes: 5.7 - 6.4         Diabetes: >6.4         Glycemic control for adults with diabetes: <7.0    Mean Plasma Glucose 07/05/2022 94  mg/dL Final   Comment: (NOTE) Performed At: [facility] [address]  [address] [place], [place] [id number] Jolene Schimke MD WU:[id number]    Alcohol, Ethyl (B) 07/05/2022 <10  <10 mg/dL Final   Comment: (NOTE) Lowest detectable limit for serum alcohol is 10 mg/dL.  For medical purposes only. Performed at [facility], [address] [place], [place] [address]    Cholesterol 07/05/2022 111  0 - 200 mg/dL Final   Triglycerides 30/86/5784 38  <150 mg/dL Final   HDL 69/62/9528 71  >40 mg/dL Final   Total CHOL/HDL Ratio  07/05/2022 1.6  RATIO Final   VLDL 07/05/2022 8  0 - 40 mg/dL Final   LDL Cholesterol 07/05/2022 32  0 - 99 mg/dL Final   Comment:        Total Cholesterol/HDL:CHD Risk Coronary Heart Disease Risk Table                     Men   Women  1/2 Average Risk   3.4   3.3  Average Risk       5.0   4.4  2 X Average Risk   9.6   7.1  3 X Average Risk  23.4   11.0        Use the calculated Patient Ratio above and the CHD Risk Table to determine the patient's CHD Risk.        ATP III CLASSIFICATION (LDL):  <100     mg/dL   Optimal  413-244  mg/dL   Near or Above                    Optimal  130-159  mg/dL   Borderline  010-272  mg/dL   High  >536     mg/dL   Very High Performed at [facility], [address] [place], [place] [address]    TSH 07/05/2022 0.914  0.350 - 4.500 uIU/mL Final   Comment: Performed by a 3rd Generation assay with a functional sensitivity of <=0.01 uIU/mL. Performed at [facility], [address] [place], [place] [address]    SARSCOV2ONAVIRUS 2 AG 07/05/2022 NEGATIVE  NEGATIVE Final   Comment: (NOTE) SARS-CoV-2 antigen NOT DETECTED.   Negative results are presumptive.  Negative results do not preclude SARS-CoV-2 infection and should not be used as the sole basis for treatment or other patient management decisions, including infection  control decisions, particularly in the presence of clinical signs and  symptoms consistent with COVID-19, or in those who have been in contact with the virus.  Negative results must be combined with clinical observations, patient history, and epidemiological information. The expected result is Negative.  Fact Sheet for Patients: https://www.jennings-kim.com/  Fact Sheet for Healthcare Providers: https://alexander-rogers.biz/  This test is not yet approved or cleared by the [place] FDA and  has been authorized for detection and/or diagnosis of SARS-CoV-2 by FDA under an Emergency Use  Authorization (EUA).  This EUA will remain in effect (meaning this test can be used) for the duration of  the COV                          ID-19 declaration under Section 564(b)(1) of the Act, 21 U.S.C. section 360bbb-3(b)(1), unless the authorization is terminated or revoked sooner.    Admission on 05/16/2022, Discharged on 05/17/2022  Component Date Value Ref Range Status   Sodium 05/17/2022 135  135 - 145 mmol/L Final   Potassium 05/17/2022 3.7  3.5 - 5.1 mmol/L  Final   Chloride 05/17/2022 102  98 - 111 mmol/L Final   CO2 05/17/2022 25  22 - 32 mmol/L Final   Glucose, Bld 05/17/2022 152 (H)  70 - 99 mg/dL Final   Glucose reference range applies only to samples taken after fasting for at least 8 hours.   BUN 05/17/2022 22 (H)  6 - 20 mg/dL Final   Creatinine, Ser 05/17/2022 1.41 (H)  0.61 - 1.24 mg/dL Final   Calcium 16/04/9603 9.2  8.9 - 10.3 mg/dL Final   Total Protein 54/03/8118 8.5 (H)  6.5 - 8.1 g/dL Final   Albumin 14/78/2956 4.8  3.5 - 5.0 g/dL Final   AST 21/30/8657 47 (H)  15 - 41 U/L Final   ALT 05/17/2022 30  0 - 44 U/L Final   Alkaline Phosphatase 05/17/2022 65  38 - 126 U/L Final   Total Bilirubin 05/17/2022 0.7  0.3 - 1.2 mg/dL Final   GFR, Estimated 05/17/2022 >60  >60 mL/min Final   Comment: (NOTE) Calculated using the CKD-EPI Creatinine Equation (2021)    Anion gap 05/17/2022 8  5 - 15 Final   Performed at [facility], [address] [place], [place] [address]   Alcohol, Ethyl (B) 05/17/2022 <10  <10 mg/dL Final   Comment: (NOTE) Lowest detectable limit for serum alcohol is 10 mg/dL.  For medical purposes only. Performed at [facility], [address], [place], [place] [address]    Salicylate Lvl 05/17/2022 <7.0 (L)  7.0 - 30.0 mg/dL Final   Performed at [facility], [address] [place], [place] [address]   Acetaminophen (Tylenol), Serum 05/17/2022 <10 (L)  10 - 30 ug/mL Final   Comment:  (NOTE) Therapeutic concentrations vary significantly. A range of 10-30 ug/mL  may be an effective concentration for many patients. However, some  are best treated at concentrations outside of this range. Acetaminophen concentrations >150 ug/mL at 4 hours after ingestion  and >50 ug/mL at 12 hours after ingestion are often associated with  toxic reactions.  Performed at [facility], [address] [place], [place] [address]    WBC 05/17/2022 7.2  4.0 - 10.5 K/uL Final   RBC 05/17/2022 4.33  4.22 - 5.81 MIL/uL Final   Hemoglobin 05/17/2022 14.9  13.0 - 17.0 g/dL Final   HCT 72/53/6644 44.2  39.0 - 52.0 % Final   MCV 05/17/2022 102.1 (H)  80.0 - 100.0 fL Final   MCH 05/17/2022 34.4 (H)  26.0 - 34.0 pg Final   MCHC 05/17/2022 33.7  30.0 - 36.0 g/dL Final   RDW 03/47/4259 12.4  11.5 - 15.5 % Final   Platelets 05/17/2022 191  150 - 400 K/uL Final   nRBC 05/17/2022 0.0  0.0 - 0.2 % Final   Performed at [facility], [address], [place], [place] [address]   Opiates 05/17/2022 NONE DETECTED  NONE DETECTED Final   Cocaine 05/17/2022 POSITIVE (A)  NONE DETECTED Final   Benzodiazepines 05/17/2022 NONE DETECTED  NONE DETECTED Final   Amphetamines 05/17/2022 NONE DETECTED  NONE DETECTED Final   Tetrahydrocannabinol 05/17/2022 POSITIVE (A)  NONE DETECTED Final   Barbiturates 05/17/2022 NONE DETECTED  NONE DETECTED Final   Comment: (NOTE) DRUG SCREEN FOR MEDICAL PURPOSES ONLY.  IF CONFIRMATION IS NEEDED FOR ANY PURPOSE, NOTIFY LAB WITHIN 5 DAYS.  LOWEST DETECTABLE LIMITS FOR URINE DRUG SCREEN Drug Class  Cutoff (ng/mL) Amphetamine and metabolites    1000 Barbiturate and metabolites    200 Benzodiazepine                 200 Opiates and metabolites        300 Cocaine and metabolites        300 THC                            50 Performed at [facility], [address] [place], [place] [address]      Allergies: Tylenol [acetaminophen]  Medications:  Facility Ordered Medications  Medication   alum & mag hydroxide-simeth (MAALOX/MYLANTA) 200-200-20 MG/5ML suspension 30 mL   magnesium hydroxide (MILK OF MAGNESIA) suspension 30 mL   [COMPLETED] thiamine (VITAMIN B1) injection 100 mg   thiamine (VITAMIN B1) tablet 100 mg   multivitamin with minerals tablet 1 tablet   LORazepam (ATIVAN) tablet 1 mg   hydrOXYzine (ATARAX) tablet 25 mg   loperamide (IMODIUM) capsule 2-4 mg   ondansetron (ZOFRAN-ODT) disintegrating tablet 4 mg   LORazepam (ATIVAN) tablet 1 mg   Followed by   LORazepam (ATIVAN) tablet 1 mg   Followed by   Melene Muller ON 11/01/2022] LORazepam (ATIVAN) tablet 1 mg   Followed by   Melene Muller ON 11/03/2022] LORazepam (ATIVAN) tablet 1 mg   OLANZapine zydis (ZYPREXA) disintegrating tablet 5 mg   And   LORazepam (ATIVAN) tablet 1 mg   And   ziprasidone (GEODON) injection 20 mg   PTA Medications  Medication Sig   nicotine (NICODERM CQ - DOSED IN MG/24 HOURS) 21 mg/24hr patch Place 1 patch (21 mg total) onto the skin daily at 6 (six) AM. (Patient not taking: Reported on 10/17/2022)   loratadine (CLARITIN) 10 MG tablet Take 10 mg by mouth daily as needed for allergies.   Melatonin 10 MG TABS Take 1 tablet by mouth at bedtime as needed (sleep).   loperamide (IMODIUM) 2 MG capsule Take 1 capsule (2 mg total) by mouth 4 (four) times daily as needed for diarrhea or loose stools.   ondansetron (ZOFRAN-ODT) 4 MG disintegrating tablet 4mg  ODT q4 hours prn nausea/vomit   dicyclomine (BENTYL) 20 MG tablet Take 1 tablet (20 mg total) by mouth 3 (three) times daily before meals.   amoxicillin (AMOXIL) 500 MG capsule Take 1 capsule (500 mg total) by mouth 2 (two) times daily for 10 days.    Long Term Goals: Improvement in symptoms so as ready for discharge  Short Term Goals: Patient will verbalize feelings in meetings with treatment team members., Patient will attend at least of 50% of the  groups daily., Pt will complete the PHQ9 on admission, day 3 and discharge., Patient will participate in completing the [place] Suicide Severity Rating Scale, Patient will score a low risk of violence for 24 hours prior to discharge, and Patient will take medications as prescribed daily.  Medical Decision Making  [facility] admission   Meds ordered this encounter  Medications   alum & mag hydroxide-simeth (MAALOX/MYLANTA) 200-200-20 MG/5ML suspension 30 mL   magnesium hydroxide (MILK OF MAGNESIA) suspension 30 mL   thiamine (VITAMIN B1) injection 100 mg   thiamine (VITAMIN B1) tablet 100 mg   multivitamin with minerals tablet 1 tablet   LORazepam (ATIVAN) tablet 1 mg   hydrOXYzine (ATARAX) tablet 25 mg   loperamide (IMODIUM) capsule 2-4 mg   ondansetron (ZOFRAN-ODT) disintegrating tablet 4 mg   FOLLOWED BY Linked  Order Group    LORazepam (ATIVAN) tablet 1 mg    LORazepam (ATIVAN) tablet 1 mg    LORazepam (ATIVAN) tablet 1 mg    LORazepam (ATIVAN) tablet 1 mg   AND Linked Order Group    OLANZapine zydis (ZYPREXA) disintegrating tablet 5 mg    LORazepam (ATIVAN) tablet 1 mg    ziprasidone (GEODON) injection 20 mg    Lab Orders         SARS Coronavirus 2 by RT PCR (hospital order, performed in [facility] hospital lab) *cepheid single result test* Anterior Nasal Swab         POC SARS Coronavirus 2 Ag     Recommendations  Based on my evaluation the patient appears to have an emergency medical condition for which I recommend the patient be transferred to the emergency department for further evaluation.  Sindy Guadeloupe, NP 10/31/22  5:50 AM

## 2022-10-31 DIAGNOSIS — F1410 Cocaine abuse, uncomplicated: Secondary | ICD-10-CM | POA: Diagnosis not present

## 2022-10-31 NOTE — ED Provider Notes (Cosign Needed Addendum)
Behavioral Health Progress Note  Date and Time: 10/31/2022 9:15 AM Name: Calvin [name] MRN:  [id number]  Subjective:  [name] stated " I want to got to a sober living house."  Evaluation:  [name] was seen and evaluated face-to-face by this provider.  Patient observed resting in bed.  Blankets pulled over his head during most of this evaluation.  [name] is very minimal throughout this assessment.  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  States a recent relapse where he was discharged from [facility] treatment facility 2 weeks prior.  Denied that he has followed up with social worker at this time.  Per nursing staff patient has not been compliant with medication.  Orders placed for CIWA protocol.  He denied nausea, vomiting, dizziness or tremors.   Patient encouraged to contact sober living of [place] facilities within the local area for admission process. He appeared receptive to plan.  Denied depression or depressive symptoms.  [name] is resting in bed; he is alert/oriented x 4; calm/cooperative; and mood congruent with affect.  Patient is speaking in a clear tone at moderate volume, and normal pace; with good eye contact.  Very minimal short and abrupt response. His thought process is coherent and relevant; There is no indication that he is currently responding to internal/external stimuli or experiencing delusional thought content.  Patient denies suicidal/self-harm/homicidal ideation, psychosis, and paranoia.  Patient has remained calm throughout assessment.   Diagnosis:  Final diagnoses:  Cocaine abuse  Marijuana abuse  Alcohol abuse    Total Time spent with patient: 15 minutes  Past Psychiatric History:  Past Medical History:  Family History:  Family Psychiatric  History:  Social History:   Additional Social History:    Pain Medications: See MAR Prescriptions: See MAR Over the Counter: See MAR History of alcohol / drug use?: Yes Longest period of  sobriety (when/how long): 7 years when incarcerated Negative Consequences of Use:  (N/A) Withdrawal Symptoms: None Name of Substance 1: Cocaine 1 - Age of First Use: 14 1 - Amount (size/oz): 1 gram 1 - Frequency: Daily 1 - Duration: Ongoing 1 - Last Use / Amount: Today 1 - Method of Aquiring: Purchase from peers 1- Route of Use: Snort Name of Substance 2: Alcohol 2 - Age of First Use: 24 2 - Amount (size/oz): 1 pint 2 - Frequency: Daily 2 - Duration: Ongoing 2 - Last Use / Amount: Today 2 - Method of Aquiring: Purchase 2 - Route of Substance Use: Orally Name of Substance 3: Marijuana 3 - Age of First Use: 13 3 - Amount (size/oz): 3-4 blunts a day 3 - Frequency: Daily 3 - Duration: Ongoing 3 - Last Use / Amount: Today 3 - Method of Aquiring: Purchase 3 - Route of Substance Use: Orally, smokes              Sleep: Good  Appetite:  Good  Current Medications:  Current Facility-Administered Medications  Medication Dose Route Frequency Provider Last Rate Last Admin   alum & mag hydroxide-simeth (MAALOX/MYLANTA) 200-200-20 MG/5ML suspension 30 mL  30 mL Oral Q4H PRN Sindy Guadeloupe, NP       hydrOXYzine (ATARAX) tablet 25 mg  25 mg Oral Q6H PRN Sindy Guadeloupe, NP       loperamide (IMODIUM) capsule 2-4 mg  2-4 mg Oral PRN Sindy Guadeloupe, NP       LORazepam (ATIVAN) tablet 1 mg  1 mg Oral Q6H PRN Sindy Guadeloupe, NP  LORazepam (ATIVAN) tablet 1 mg  1 mg Oral QID Sindy Guadeloupe, NP   1 mg at 10/30/22 2152   Followed by   LORazepam (ATIVAN) tablet 1 mg  1 mg Oral TID Sindy Guadeloupe, NP       Followed by   Melene Muller ON 11/01/2022] LORazepam (ATIVAN) tablet 1 mg  1 mg Oral BID Sindy Guadeloupe, NP       Followed by   Melene Muller ON 11/03/2022] LORazepam (ATIVAN) tablet 1 mg  1 mg Oral Daily Sindy Guadeloupe, NP       OLANZapine zydis (ZYPREXA) disintegrating tablet 5 mg  5 mg Oral Q8H PRN Sindy Guadeloupe, NP       And   LORazepam (ATIVAN) tablet 1 mg  1 mg Oral PRN Sindy Guadeloupe, NP       And    ziprasidone (GEODON) injection 20 mg  20 mg Intramuscular PRN Sindy Guadeloupe, NP       magnesium hydroxide (MILK OF MAGNESIA) suspension 30 mL  30 mL Oral Daily PRN Sindy Guadeloupe, NP       multivitamin with minerals tablet 1 tablet  1 tablet Oral Daily Sindy Guadeloupe, NP   1 tablet at 10/30/22 2152   ondansetron (ZOFRAN-ODT) disintegrating tablet 4 mg  4 mg Oral Q6H PRN Sindy Guadeloupe, NP       thiamine (VITAMIN B1) tablet 100 mg  100 mg Oral Daily Sindy Guadeloupe, NP       Current Outpatient Medications  Medication Sig Dispense Refill   albuterol (VENTOLIN HFA) 108 (90 Base) MCG/ACT inhaler Inhale 2 puffs into the lungs every 4 (four) hours as needed for wheezing or shortness of breath.     amoxicillin (AMOXIL) 500 MG capsule Take 1 capsule (500 mg total) by mouth 2 (two) times daily for 10 days. 20 capsule 0   dicyclomine (BENTYL) 20 MG tablet Take 1 tablet (20 mg total) by mouth 3 (three) times daily before meals. 30 tablet 0   loperamide (IMODIUM) 2 MG capsule Take 1 capsule (2 mg total) by mouth 4 (four) times daily as needed for diarrhea or loose stools. 12 capsule 0   loratadine (CLARITIN) 10 MG tablet Take 10 mg by mouth daily as needed for allergies.     Melatonin 10 MG TABS Take 1 tablet by mouth at bedtime as needed (sleep).     nicotine (NICODERM CQ - DOSED IN MG/24 HOURS) 21 mg/24hr patch Place 1 patch (21 mg total) onto the skin daily at 6 (six) AM. (Patient not taking: Reported on 10/17/2022) 28 patch 0   ondansetron (ZOFRAN-ODT) 4 MG disintegrating tablet 4mg  ODT q4 hours prn nausea/vomit 10 tablet 0    Labs  Lab Results:  Admission on 10/30/2022  Component Date Value Ref Range Status   SARS Coronavirus 2 by RT PCR 10/30/2022 NEGATIVE  NEGATIVE Final   Performed at [facility], [address] [place], [place] [address]   SARSCOV2ONAVIRUS 2 AG 10/30/2022 NEGATIVE  NEGATIVE Final   Comment: (NOTE) SARS-CoV-2 antigen NOT DETECTED.   Negative results are presumptive.   Negative results do not preclude SARS-CoV-2 infection and should not be used as the sole basis for treatment or other patient management decisions, including infection  control decisions, particularly in the presence of clinical signs and  symptoms consistent with COVID-19, or in those who have been in contact with the virus.  Negative results must be combined with clinical observations, patient history, and epidemiological information. The expected result is Negative.  Fact Sheet for Patients: https://www.jennings-kim.com/  Fact Sheet for Healthcare Providers: https://alexander-rogers.biz/  This test is not yet approved or cleared by the [place] FDA and  has been authorized for detection and/or diagnosis of SARS-CoV-2 by FDA under an Emergency Use Authorization (EUA).  This EUA will remain in effect (meaning this test can be used) for the duration of  the COV                          ID-19 declaration under Section 564(b)(1) of the Act, 21 U.S.C. section 360bbb-3(b)(1), unless the authorization is terminated or revoked sooner.    Admission on 10/26/2022, Discharged on 10/26/2022  Component Date Value Ref Range Status   Sodium 10/26/2022 137  135 - 145 mmol/L Final   Potassium 10/26/2022 3.6  3.5 - 5.1 mmol/L Final   Chloride 10/26/2022 102  98 - 111 mmol/L Final   CO2 10/26/2022 24  22 - 32 mmol/L Final   Glucose, Bld 10/26/2022 79  70 - 99 mg/dL Final   Glucose reference range applies only to samples taken after fasting for at least 8 hours.   BUN 10/26/2022 10  6 - 20 mg/dL Final   Creatinine, Ser 10/26/2022 1.08  0.61 - 1.24 mg/dL Final   Calcium 16/04/9603 8.7 (L)  8.9 - 10.3 mg/dL Final   GFR, Estimated 10/26/2022 >60  >60 mL/min Final   Comment: (NOTE) Calculated using the CKD-EPI Creatinine Equation (2021)    Anion gap 10/26/2022 11  5 - 15 Final   Performed at [facility], [address], [place], [place] [address]   WBC  10/26/2022 6.5  4.0 - 10.5 K/uL Final   RBC 10/26/2022 3.91 (L)  4.22 - 5.81 MIL/uL Final   Hemoglobin 10/26/2022 13.6  13.0 - 17.0 g/dL Final   HCT 11/91/4782 39.7  39.0 - 52.0 % Final   MCV 10/26/2022 101.5 (H)  80.0 - 100.0 fL Final   MCH 10/26/2022 34.8 (H)  26.0 - 34.0 pg Final   MCHC 10/26/2022 34.3  30.0 - 36.0 g/dL Final   RDW 95/62/1308 12.6  11.5 - 15.5 % Final   Platelets 10/26/2022 204  150 - 400 K/uL Final   nRBC 10/26/2022 0.0  0.0 - 0.2 % Final   Neutrophils Relative % 10/26/2022 70  % Final   Neutro Abs 10/26/2022 4.5  1.7 - 7.7 K/uL Final   Lymphocytes Relative 10/26/2022 18  % Final   Lymphs Abs 10/26/2022 1.2  0.7 - 4.0 K/uL Final   Monocytes Relative 10/26/2022 9  % Final   Monocytes Absolute 10/26/2022 0.6  0.1 - 1.0 K/uL Final   Eosinophils Relative 10/26/2022 3  % Final   Eosinophils Absolute 10/26/2022 0.2  0.0 - 0.5 K/uL Final   Basophils Relative 10/26/2022 0  % Final   Basophils Absolute 10/26/2022 0.0  0.0 - 0.1 K/uL Final   Immature Granulocytes 10/26/2022 0  % Final   Abs Immature Granulocytes 10/26/2022 0.02  0.00 - 0.07 K/uL Final   Performed at [facility], [address], [place], [place] [address]   Group A Strep by PCR 10/26/2022 DETECTED (A)  NOT DETECTED Final   Performed at [facility], [address], [place], [place] [address]   SARS Coronavirus 2 by RT PCR 10/26/2022 NEGATIVE  NEGATIVE Final   Influenza A by PCR 10/26/2022 NEGATIVE  NEGATIVE Final   Influenza B by PCR 10/26/2022 NEGATIVE  NEGATIVE Final   Comment: (NOTE) The Xpert Xpress SARS-CoV-2/FLU/RSV plus assay is intended as an aid in the diagnosis of influenza from Nasopharyngeal swab specimens and should not be used as a sole basis for treatment. Nasal washings and aspirates are unacceptable for Xpert Xpress SARS-CoV-2/FLU/RSV testing.  Fact Sheet for Patients: BloggerCourse.com  Fact Sheet for Healthcare  Providers: SeriousBroker.it  This test is not yet approved or cleared by the [place] FDA and has been authorized for detection and/or diagnosis of SARS-CoV-2 by FDA under an Emergency Use Authorization (EUA). This EUA will remain in effect (meaning this test can be used) for the duration of the COVID-19 declaration under Section 564(b)(1) of the Act, 21 U.S.C. section 360bbb-3(b)(1), unless the authorization is terminated or revoked.     Resp Syncytial Virus by PCR 10/26/2022 NEGATIVE  NEGATIVE Final   Comment: (NOTE) Fact Sheet for Patients: BloggerCourse.com  Fact Sheet for Healthcare Providers: SeriousBroker.it  This test is not yet approved or cleared by the [place] FDA and has been authorized for detection and/or diagnosis of SARS-CoV-2 by FDA under an Emergency Use Authorization (EUA). This EUA will remain in effect (meaning this test can be used) for the duration of the COVID-19 declaration under Section 564(b)(1) of the Act, 21 U.S.C. section 360bbb-3(b)(1), unless the authorization is terminated or revoked.  Performed at [facility], [address] [place], [place] [address]   Admission on 10/22/2022, Discharged on 10/22/2022  Component Date Value Ref Range Status   Lipase 10/22/2022 26  11 - 51 U/L Final   Performed at [facility], [address] [place], [place] [address]   Sodium 10/22/2022 141  135 - 145 mmol/L Final   Potassium 10/22/2022 3.7  3.5 - 5.1 mmol/L Final   Chloride 10/22/2022 105  98 - 111 mmol/L Final   CO2 10/22/2022 20 (L)  22 - 32 mmol/L Final   Glucose, Bld 10/22/2022 68 (L)  70 - 99 mg/dL Final   Glucose reference range applies only to samples taken after fasting for at least 8 hours.   BUN 10/22/2022 16  6 - 20 mg/dL Final   Creatinine, Ser 10/22/2022 1.15  0.61 - 1.24 mg/dL Final   Calcium 09/81/1914 8.8 (L)  8.9 - 10.3 mg/dL Final   Total  Protein 10/22/2022 7.4  6.5 - 8.1 g/dL Final   Albumin 78/29/5621 4.2  3.5 - 5.0 g/dL Final   AST 30/86/5784 51 (H)  15 - 41 U/L Final   ALT 10/22/2022 44  0 - 44 U/L Final   Alkaline Phosphatase 10/22/2022 72  38 - 126 U/L Final   Total Bilirubin 10/22/2022 0.3  0.3 - 1.2 mg/dL Final   GFR, Estimated 10/22/2022 >60  >60 mL/min Final   Comment: (NOTE) Calculated using the CKD-EPI Creatinine Equation (2021)    Anion gap 10/22/2022 16 (H)  5 - 15 Final   Performed at [facility], [address], [place], [place] [address]   WBC 10/22/2022 8.0  4.0 - 10.5 K/uL Final   RBC 10/22/2022 3.94 (L)  4.22 - 5.81 MIL/uL Final   Hemoglobin 10/22/2022 13.9  13.0 - 17.0 g/dL Final   HCT 52/84/1324 41.5  39.0 - 52.0 % Final   MCV 10/22/2022 105.3 (H)  80.0 - 100.0 fL Final   MCH 10/22/2022 35.3 (H)  26.0 - 34.0 pg Final   MCHC 10/22/2022 33.5  30.0 - 36.0 g/dL Final   RDW 40/04/2724 13.1  11.5 -  15.5 % Final   Platelets 10/22/2022 235  150 - 400 K/uL Final   nRBC 10/22/2022 0.0  0.0 - 0.2 % Final   Performed at [facility], [address], [place], [place] [address]   Color, Urine 10/22/2022 YELLOW  YELLOW Final   APPearance 10/22/2022 CLEAR  CLEAR Final   Specific Gravity, Urine 10/22/2022 1.025  1.005 - 1.030 Final   pH 10/22/2022 5.0  5.0 - 8.0 Final   Glucose, UA 10/22/2022 NEGATIVE  NEGATIVE mg/dL Final   Hgb urine dipstick 10/22/2022 MODERATE (A)  NEGATIVE Final   Bilirubin Urine 10/22/2022 NEGATIVE  NEGATIVE Final   Ketones, ur 10/22/2022 20 (A)  NEGATIVE mg/dL Final   Protein, ur 19/14/7829 NEGATIVE  NEGATIVE mg/dL Final   Nitrite 56/21/3086 NEGATIVE  NEGATIVE Final   Leukocytes,Ua 10/22/2022 SMALL (A)  NEGATIVE Final   RBC / HPF 10/22/2022 0-5  0 - 5 RBC/hpf Final   WBC, UA 10/22/2022 21-50  0 - 5 WBC/hpf Final   Bacteria, UA 10/22/2022 FEW (A)  NONE SEEN Final   Squamous Epithelial / HPF 10/22/2022 0-5  0 - 5 /HPF Final   Mucus 10/22/2022 PRESENT   Final   Performed at  [facility], [address] [place], [place] [address]   Opiates 10/22/2022 NONE DETECTED  NONE DETECTED Final   Cocaine 10/22/2022 POSITIVE (A)  NONE DETECTED Final   Benzodiazepines 10/22/2022 NONE DETECTED  NONE DETECTED Final   Amphetamines 10/22/2022 NONE DETECTED  NONE DETECTED Final   Tetrahydrocannabinol 10/22/2022 POSITIVE (A)  NONE DETECTED Final   Barbiturates 10/22/2022 NONE DETECTED  NONE DETECTED Final   Comment: (NOTE) DRUG SCREEN FOR MEDICAL PURPOSES ONLY.  IF CONFIRMATION IS NEEDED FOR ANY PURPOSE, NOTIFY LAB WITHIN 5 DAYS.  LOWEST DETECTABLE LIMITS FOR URINE DRUG SCREEN Drug Class                     Cutoff (ng/mL) Amphetamine and metabolites    1000 Barbiturate and metabolites    200 Benzodiazepine                 200 Opiates and metabolites        300 Cocaine and metabolites        300 THC                            50 Performed at [facility], [address], [place], [place] [address]    Glucose-Capillary 10/22/2022 57 (L)  70 - 99 mg/dL Final   Glucose reference range applies only to samples taken after fasting for at least 8 hours.   Glucose-Capillary 10/22/2022 83  70 - 99 mg/dL Final   Glucose reference range applies only to samples taken after fasting for at least 8 hours.   Comment 1 10/22/2022 Document in Chart   Final  Admission on 10/17/2022, Discharged on 10/17/2022  Component Date Value Ref Range Status   WBC 10/17/2022 6.9  4.0 - 10.5 K/uL Final   RBC 10/17/2022 3.79 (L)  4.22 - 5.81 MIL/uL Final   Hemoglobin 10/17/2022 13.1  13.0 - 17.0 g/dL Final   HCT 28/41/3244 39.5  39.0 - 52.0 % Final   MCV 10/17/2022 104.2 (H)  80.0 - 100.0 fL Final   MCH 10/17/2022 34.6 (H)  26.0 - 34.0 pg Final   MCHC 10/17/2022 33.2  30.0 - 36.0 g/dL Final   RDW 07/23/7251 13.1  11.5 - 15.5 % Final   Platelets 10/17/2022 209  150 - 400 K/uL Final   nRBC 10/17/2022 0.0  0.0 - 0.2 % Final   Neutrophils Relative % 10/17/2022 62  % Final   Neutro Abs  10/17/2022 4.2  1.7 - 7.7 K/uL Final   Lymphocytes Relative 10/17/2022 28  % Final   Lymphs Abs 10/17/2022 1.9  0.7 - 4.0 K/uL Final   Monocytes Relative 10/17/2022 8  % Final   Monocytes Absolute 10/17/2022 0.6  0.1 - 1.0 K/uL Final   Eosinophils Relative 10/17/2022 2  % Final   Eosinophils Absolute 10/17/2022 0.1  0.0 - 0.5 K/uL Final   Basophils Relative 10/17/2022 0  % Final   Basophils Absolute 10/17/2022 0.0  0.0 - 0.1 K/uL Final   Immature Granulocytes 10/17/2022 0  % Final   Abs Immature Granulocytes 10/17/2022 0.01  0.00 - 0.07 K/uL Final   Performed at [facility], [address] [place], [place] [address]   Sodium 10/17/2022 136  135 - 145 mmol/L Final   Potassium 10/17/2022 3.5  3.5 - 5.1 mmol/L Final   Chloride 10/17/2022 105  98 - 111 mmol/L Final   CO2 10/17/2022 23  22 - 32 mmol/L Final   Glucose, Bld 10/17/2022 81  70 - 99 mg/dL Final   Glucose reference range applies only to samples taken after fasting for at least 8 hours.   BUN 10/17/2022 9  6 - 20 mg/dL Final   Creatinine, Ser 10/17/2022 0.88  0.61 - 1.24 mg/dL Final   Calcium 60/45/4098 8.6 (L)  8.9 - 10.3 mg/dL Final   Total Protein 11/91/4782 7.2  6.5 - 8.1 g/dL Final   Albumin 95/62/1308 4.3  3.5 - 5.0 g/dL Final   AST 65/78/4696 45 (H)  15 - 41 U/L Final   ALT 10/17/2022 70 (H)  0 - 44 U/L Final   Alkaline Phosphatase 10/17/2022 68  38 - 126 U/L Final   Total Bilirubin 10/17/2022 1.3 (H)  0.3 - 1.2 mg/dL Final   GFR, Estimated 10/17/2022 >60  >60 mL/min Final   Comment: (NOTE) Calculated using the CKD-EPI Creatinine Equation (2021)    Anion gap 10/17/2022 8  5 - 15 Final   Performed at [facility], [address] [place], [place] [address]   Lipase 10/17/2022 27  11 - 51 U/L Final   Performed at [facility], [address], [place], [place] [address]   Lactic Acid, Venous 10/17/2022 0.8  0.5 - 1.9 mmol/L Final   Performed at [facility], [address], [place], [place] [address]   Alcohol, Ethyl (B) 10/17/2022 <10  <10 mg/dL Final   Comment: (NOTE) Lowest detectable limit for serum alcohol is 10 mg/dL.  For medical purposes only. Performed at [facility], [address] [place], [place] [address]   Admission on 09/22/2022, Discharged on 09/22/2022  Component Date Value Ref Range Status   WBC 09/22/2022 10.1  4.0 - 10.5 K/uL Final   RBC 09/22/2022 3.89 (L)  4.22 - 5.81 MIL/uL Final   Hemoglobin 09/22/2022 13.6  13.0 - 17.0 g/dL Final   HCT 64/40/3474 39.9  39.0 - 52.0 % Final   MCV 09/22/2022 102.6 (H)  80.0 - 100.0 fL Final   MCH 09/22/2022 35.0 (H)  26.0 - 34.0 pg Final   MCHC 09/22/2022 34.1  30.0 - 36.0 g/dL Final   RDW 25/95/6387 12.2  11.5 - 15.5 %  Final   Platelets 09/22/2022 186  150 - 400 K/uL Final   nRBC 09/22/2022 0.0  0.0 - 0.2 % Final   Neutrophils Relative % 09/22/2022 83  % Final   Neutro Abs 09/22/2022 8.3 (H)  1.7 - 7.7 K/uL Final   Lymphocytes Relative 09/22/2022 12  % Final   Lymphs Abs 09/22/2022 1.2  0.7 - 4.0 K/uL Final   Monocytes Relative 09/22/2022 5  % Final   Monocytes Absolute 09/22/2022 0.5  0.1 - 1.0 K/uL Final   Eosinophils Relative 09/22/2022 0  % Final   Eosinophils Absolute 09/22/2022 0.0  0.0 - 0.5 K/uL Final   Basophils Relative 09/22/2022 0  % Final   Basophils Absolute 09/22/2022 0.0  0.0 - 0.1 K/uL Final   Immature Granulocytes 09/22/2022 0  % Final   Abs Immature Granulocytes 09/22/2022 0.04  0.00 - 0.07 K/uL Final   Performed at [facility], [name], [place], [place] [address]   Sodium 09/22/2022 135  135 - 145 mmol/L Final   Potassium 09/22/2022 4.2  3.5 - 5.1 mmol/L Final   Chloride 09/22/2022 98  98 - 111 mmol/L Final   CO2 09/22/2022 22  22 - 32 mmol/L Final   Glucose, Bld 09/22/2022 155 (H)  70 - 99 mg/dL Final   Glucose reference range applies only to samples taken after fasting for at least 8 hours.   BUN  09/22/2022 16  6 - 20 mg/dL Final   Creatinine, Ser 09/22/2022 1.23  0.61 - 1.24 mg/dL Final   Calcium 60/45/4098 8.5 (L)  8.9 - 10.3 mg/dL Final   Total Protein 11/91/4782 7.0  6.5 - 8.1 g/dL Final   Albumin 95/62/1308 4.1  3.5 - 5.0 g/dL Final   AST 65/78/4696 39  15 - 41 U/L Final   ALT 09/22/2022 20  0 - 44 U/L Final   Alkaline Phosphatase 09/22/2022 66  38 - 126 U/L Final   Total Bilirubin 09/22/2022 0.6  0.3 - 1.2 mg/dL Final   GFR, Estimated 09/22/2022 >60  >60 mL/min Final   Comment: (NOTE) Calculated using the CKD-EPI Creatinine Equation (2021)    Anion gap 09/22/2022 15  5 - 15 Final   Performed at [facility], [address], [place], [place] [address]   Alcohol, Ethyl (B) 09/22/2022 69 (H)  <10 mg/dL Final   Comment: (NOTE) Lowest detectable limit for serum alcohol is 10 mg/dL.  For medical purposes only. Performed at [facility], [address] [place], [place] [address]    Opiates 09/22/2022 NONE DETECTED  NONE DETECTED Final   Cocaine 09/22/2022 POSITIVE (A)  NONE DETECTED Final   Benzodiazepines 09/22/2022 NONE DETECTED  NONE DETECTED Final   Amphetamines 09/22/2022 POSITIVE (A)  NONE DETECTED Final   Tetrahydrocannabinol 09/22/2022 POSITIVE (A)  NONE DETECTED Final   Barbiturates 09/22/2022 NONE DETECTED  NONE DETECTED Final   Comment: (NOTE) DRUG SCREEN FOR MEDICAL PURPOSES ONLY.  IF CONFIRMATION IS NEEDED FOR ANY PURPOSE, NOTIFY LAB WITHIN 5 DAYS.  LOWEST DETECTABLE LIMITS FOR URINE DRUG SCREEN Drug Class                     Cutoff (ng/mL) Amphetamine and metabolites    1000 Barbiturate and metabolites    200 Benzodiazepine                 200 Opiates and metabolites        300 Cocaine and metabolites  300 THC                            50 Performed at [facility], [name] [place], [place] [address]    Acetaminophen (Tylenol), Serum 09/22/2022 <10 (L)  10 - 30 ug/mL Final   Comment: (NOTE) Therapeutic  concentrations vary significantly. A range of 10-30 ug/mL  may be an effective concentration for many patients. However, some  are best treated at concentrations outside of this range. Acetaminophen concentrations >150 ug/mL at 4 hours after ingestion  and >50 ug/mL at 12 hours after ingestion are often associated with  toxic reactions.  Performed at [facility], [address] [place], [place] [address]    Salicylate Lvl 09/22/2022 <7.0 (L)  7.0 - 30.0 mg/dL Final   Performed at [facility], [address] [place], [place] [address]   SARS Coronavirus 2 by RT PCR 09/22/2022 NEGATIVE  NEGATIVE Final   Influenza A by PCR 09/22/2022 NEGATIVE  NEGATIVE Final   Influenza B by PCR 09/22/2022 NEGATIVE  NEGATIVE Final   Comment: (NOTE) The Xpert Xpress SARS-CoV-2/FLU/RSV plus assay is intended as an aid in the diagnosis of influenza from Nasopharyngeal swab specimens and should not be used as a sole basis for treatment. Nasal washings and aspirates are unacceptable for Xpert Xpress SARS-CoV-2/FLU/RSV testing.  Fact Sheet for Patients: BloggerCourse.com  Fact Sheet for Healthcare Providers: SeriousBroker.it  This test is not yet approved or cleared by the [place] FDA and has been authorized for detection and/or diagnosis of SARS-CoV-2 by FDA under an Emergency Use Authorization (EUA). This EUA will remain in effect (meaning this test can be used) for the duration of the COVID-19 declaration under Section 564(b)(1) of the Act, 21 U.S.C. section 360bbb-3(b)(1), unless the authorization is terminated or revoked.     Resp Syncytial Virus by PCR 09/22/2022 NEGATIVE  NEGATIVE Final   Comment: (NOTE) Fact Sheet for Patients: BloggerCourse.com  Fact Sheet for Healthcare Providers: SeriousBroker.it  This test is not yet approved or cleared by the [place] FDA  and has been authorized for detection and/or diagnosis of SARS-CoV-2 by FDA under an Emergency Use Authorization (EUA). This EUA will remain in effect (meaning this test can be used) for the duration of the COVID-19 declaration under Section 564(b)(1) of the Act, 21 U.S.C. section 360bbb-3(b)(1), unless the authorization is terminated or revoked.  Performed at [facility], [address], [place], [place] [address]   Admission on 09/18/2022, Discharged on 09/20/2022  Component Date Value Ref Range Status   SARS Coronavirus 2 by RT PCR 09/18/2022 NEGATIVE  NEGATIVE Final   Influenza A by PCR 09/18/2022 NEGATIVE  NEGATIVE Final   Influenza B by PCR 09/18/2022 NEGATIVE  NEGATIVE Final   Comment: (NOTE) The Xpert Xpress SARS-CoV-2/FLU/RSV plus assay is intended as an aid in the diagnosis of influenza from Nasopharyngeal swab specimens and should not be used as a sole basis for treatment. Nasal washings and aspirates are unacceptable for Xpert Xpress SARS-CoV-2/FLU/RSV testing.  Fact Sheet for Patients: BloggerCourse.com  Fact Sheet for Healthcare Providers: SeriousBroker.it  This test is not yet approved or cleared by the [place] FDA and has been authorized for detection and/or diagnosis of SARS-CoV-2 by FDA under an Emergency Use Authorization (EUA). This EUA will remain in effect (meaning this test can be used) for the duration of the COVID-19 declaration under Section 564(b)(1) of  the Act, 21 U.S.C. section 360bbb-3(b)(1), unless the authorization is terminated or revoked.     Resp Syncytial Virus by PCR 09/18/2022 NEGATIVE  NEGATIVE Final   Comment: (NOTE) Fact Sheet for Patients: BloggerCourse.com  Fact Sheet for Healthcare Providers: SeriousBroker.it  This test is not yet approved or cleared by the [place] FDA and has been authorized for detection  and/or diagnosis of SARS-CoV-2 by FDA under an Emergency Use Authorization (EUA). This EUA will remain in effect (meaning this test can be used) for the duration of the COVID-19 declaration under Section 564(b)(1) of the Act, 21 U.S.C. section 360bbb-3(b)(1), unless the authorization is terminated or revoked.  Performed at [facility], [address] [place], [place] [address]    WBC 09/18/2022 6.7  4.0 - 10.5 K/uL Final   RBC 09/18/2022 4.42  4.22 - 5.81 MIL/uL Final   Hemoglobin 09/18/2022 15.4  13.0 - 17.0 g/dL Final   HCT 60/45/4098 44.6  39.0 - 52.0 % Final   MCV 09/18/2022 100.9 (H)  80.0 - 100.0 fL Final   MCH 09/18/2022 34.8 (H)  26.0 - 34.0 pg Final   MCHC 09/18/2022 34.5  30.0 - 36.0 g/dL Final   RDW 11/91/4782 11.9  11.5 - 15.5 % Final   Platelets 09/18/2022 219  150 - 400 K/uL Final   nRBC 09/18/2022 0.0  0.0 - 0.2 % Final   Neutrophils Relative % 09/18/2022 53  % Final   Neutro Abs 09/18/2022 3.5  1.7 - 7.7 K/uL Final   Lymphocytes Relative 09/18/2022 39  % Final   Lymphs Abs 09/18/2022 2.6  0.7 - 4.0 K/uL Final   Monocytes Relative 09/18/2022 7  % Final   Monocytes Absolute 09/18/2022 0.5  0.1 - 1.0 K/uL Final   Eosinophils Relative 09/18/2022 1  % Final   Eosinophils Absolute 09/18/2022 0.1  0.0 - 0.5 K/uL Final   Basophils Relative 09/18/2022 0  % Final   Basophils Absolute 09/18/2022 0.0  0.0 - 0.1 K/uL Final   Immature Granulocytes 09/18/2022 0  % Final   Abs Immature Granulocytes 09/18/2022 0.01  0.00 - 0.07 K/uL Final   Performed at [facility], [address] [place], [place] [address]   Sodium 09/18/2022 139  135 - 145 mmol/L Final   Potassium 09/18/2022 4.0  3.5 - 5.1 mmol/L Final   Chloride 09/18/2022 99  98 - 111 mmol/L Final   CO2 09/18/2022 27  22 - 32 mmol/L Final   Glucose, Bld 09/18/2022 62 (L)  70 - 99 mg/dL Final   Glucose reference range applies only to samples taken after fasting for at least 8 hours.   BUN 09/18/2022 9  6 - 20  mg/dL Final   Creatinine, Ser 09/18/2022 0.97  0.61 - 1.24 mg/dL Final   Calcium 30/86/5784 9.4  8.9 - 10.3 mg/dL Final   Total Protein 69/62/9528 8.2 (H)  6.5 - 8.1 g/dL Final   Albumin 41/32/4401 4.4  3.5 - 5.0 g/dL Final   AST 02/72/5366 34  15 - 41 U/L Final   ALT 09/18/2022 23  0 - 44 U/L Final   Alkaline Phosphatase 09/18/2022 67  38 - 126 U/L Final   Total Bilirubin 09/18/2022 0.6  0.3 - 1.2 mg/dL Final   GFR, Estimated 09/18/2022 >60  >60 mL/min Final   Comment: (NOTE) Calculated using the CKD-EPI Creatinine Equation (2021)    Anion gap 09/18/2022 13  5 - 15 Final   Performed at [facility], [address] [place], [place] [address]   Hgb A1c MFr Bld 09/18/2022 4.9  4.8 - 5.6 % Final   Comment: (NOTE)         Prediabetes: 5.7 - 6.4         Diabetes: >6.4         Glycemic control for adults with diabetes: <7.0    Mean Plasma Glucose 09/18/2022 94  mg/dL Final   Comment: (NOTE) Performed At: [facility] [address] [place], [place] [id number] Jolene Schimke MD YS:[id number]    Cholesterol 09/18/2022 135  0 - 200 mg/dL Final   Triglycerides 32/35/5732 72  <150 mg/dL Final   HDL 20/25/4270 72  >40 mg/dL Final   Total CHOL/HDL Ratio 09/18/2022 1.9  RATIO Final   VLDL 09/18/2022 14  0 - 40 mg/dL Final   LDL Cholesterol 09/18/2022 49  0 - 99 mg/dL Final   Comment:        Total Cholesterol/HDL:CHD Risk Coronary Heart Disease Risk Table                     Men   Women  1/2 Average Risk   3.4   3.3  Average Risk       5.0   4.4  2 X Average Risk   9.6   7.1  3 X Average Risk  23.4   11.0        Use the calculated Patient Ratio above and the CHD Risk Table to determine the patient's CHD Risk.        ATP III CLASSIFICATION (LDL):  <100     mg/dL   Optimal  623-762  mg/dL   Near or Above                    Optimal  130-159  mg/dL   Borderline  831-517  mg/dL   High  >616     mg/dL   Very High Performed at [facility], [address],  [place], [place] [address]    TSH 09/18/2022 1.267  0.350 - 4.500 uIU/mL Final   Comment: Performed by a 3rd Generation assay with a functional sensitivity of <=0.01 uIU/mL. Performed at [facility], [address], [place], [place] [address]    POC Amphetamine UR 09/18/2022 None Detected  NONE DETECTED (Cut Off Level 1000 ng/mL) Preliminary   POC Secobarbital (BAR) 09/18/2022 None Detected  NONE DETECTED (Cut Off Level 300 ng/mL) Preliminary   POC Buprenorphine (BUP) 09/18/2022 None Detected  NONE DETECTED (Cut Off Level 10 ng/mL) Preliminary   POC Oxazepam (BZO) 09/18/2022 None Detected  NONE DETECTED (Cut Off Level 300 ng/mL) Preliminary   POC Cocaine UR 09/18/2022 None Detected  NONE DETECTED (Cut Off Level 300 ng/mL) Preliminary   POC Methamphetamine UR 09/18/2022 Positive (A)  NONE DETECTED (Cut Off Level 1000 ng/mL) Preliminary   POC Morphine 09/18/2022 None Detected  NONE DETECTED (Cut Off Level 300 ng/mL) Preliminary   POC Methadone UR 09/18/2022 None Detected  NONE DETECTED (Cut Off Level 300 ng/mL) Preliminary   POC Oxycodone UR 09/18/2022 Positive (A)  NONE DETECTED (Cut Off Level 100 ng/mL) Preliminary   POC Marijuana UR 09/18/2022 Positive (A)  NONE DETECTED (Cut Off Level 50 ng/mL) Preliminary   SARSCOV2ONAVIRUS 2 AG 09/18/2022 NEGATIVE  NEGATIVE Final   Comment: (NOTE) SARS-CoV-2 antigen NOT DETECTED.   Negative results are presumptive.  Negative results do not preclude SARS-CoV-2 infection and  should not be used as the sole basis for treatment or other patient management decisions, including infection  control decisions, particularly in the presence of clinical signs and  symptoms consistent with COVID-19, or in those who have been in contact with the virus.  Negative results must be combined with clinical observations, patient history, and epidemiological information. The expected result is Negative.  Fact Sheet for Patients:  https://www.jennings-kim.com/  Fact Sheet for Healthcare Providers: https://alexander-rogers.biz/  This test is not yet approved or cleared by the [place] FDA and  has been authorized for detection and/or diagnosis of SARS-CoV-2 by FDA under an Emergency Use Authorization (EUA).  This EUA will remain in effect (meaning this test can be used) for the duration of  the COV                          ID-19 declaration under Section 564(b)(1) of the Act, 21 U.S.C. section 360bbb-3(b)(1), unless the authorization is terminated or revoked sooner.    Admission on 08/05/2022, Discharged on 08/06/2022  Component Date Value Ref Range Status   Lipase 08/05/2022 49  11 - 51 U/L Final   Performed at [facility], [address], [place], [place] [address]   Sodium 08/05/2022 136  135 - 145 mmol/L Final   Potassium 08/05/2022 3.9  3.5 - 5.1 mmol/L Final   Chloride 08/05/2022 102  98 - 111 mmol/L Final   CO2 08/05/2022 26  22 - 32 mmol/L Final   Glucose, Bld 08/05/2022 145 (H)  70 - 99 mg/dL Final   Glucose reference range applies only to samples taken after fasting for at least 8 hours.   BUN 08/05/2022 8  6 - 20 mg/dL Final   Creatinine, Ser 08/05/2022 1.01  0.61 - 1.24 mg/dL Final   Calcium 15/61/5379 9.0  8.9 - 10.3 mg/dL Final   Total Protein 43/27/6147 7.1  6.5 - 8.1 g/dL Final   Albumin 04/18/5746 3.9  3.5 - 5.0 g/dL Final   AST 34/09/7094 27  15 - 41 U/L Final   ALT 08/05/2022 15  0 - 44 U/L Final   Alkaline Phosphatase 08/05/2022 64  38 - 126 U/L Final   Total Bilirubin 08/05/2022 0.5  0.3 - 1.2 mg/dL Final   GFR, Estimated 08/05/2022 >60  >60 mL/min Final   Comment: (NOTE) Calculated using the CKD-EPI Creatinine Equation (2021)    Anion gap 08/05/2022 8  5 - 15 Final   Performed at [facility], [address] [place], [place] [address]   WBC 08/05/2022 6.7  4.0 - 10.5 K/uL Final   RBC 08/05/2022 4.25  4.22 - 5.81 MIL/uL Final   Hemoglobin  08/05/2022 14.7  13.0 - 17.0 g/dL Final   HCT 18/40/3754 44.2  39.0 - 52.0 % Final   MCV 08/05/2022 104.0 (H)  80.0 - 100.0 fL Final   MCH 08/05/2022 34.6 (H)  26.0 - 34.0 pg Final   MCHC 08/05/2022 33.3  30.0 - 36.0 g/dL Final   RDW 36/12/7701 11.9  11.5 - 15.5 % Final   Platelets 08/05/2022 209  150 - 400 K/uL Final   nRBC 08/05/2022 0.0  0.0 - 0.2 % Final   Performed at [facility], [address] [place], [place] [address]   Color, Urine 08/05/2022 YELLOW  YELLOW Final   APPearance 08/05/2022 CLEAR  CLEAR Final   Specific Gravity, Urine 08/05/2022 1.025  1.005 - 1.030 Final  pH 08/05/2022 6.0  5.0 - 8.0 Final   Glucose, UA 08/05/2022 50 (A)  NEGATIVE mg/dL Final   Hgb urine dipstick 08/05/2022 SMALL (A)  NEGATIVE Final   Bilirubin Urine 08/05/2022 NEGATIVE  NEGATIVE Final   Ketones, ur 08/05/2022 NEGATIVE  NEGATIVE mg/dL Final   Protein, ur 16/04/9603 30 (A)  NEGATIVE mg/dL Final   Nitrite 54/03/8118 NEGATIVE  NEGATIVE Final   Leukocytes,Ua 08/05/2022 SMALL (A)  NEGATIVE Final   RBC / HPF 08/05/2022 6-10  0 - 5 RBC/hpf Final   WBC, UA 08/05/2022 11-20  0 - 5 WBC/hpf Final   Bacteria, UA 08/05/2022 RARE (A)  NONE SEEN Final   Squamous Epithelial / HPF 08/05/2022 0-5  0 - 5 /HPF Final   Mucus 08/05/2022 PRESENT   Final   Hyaline Casts, UA 08/05/2022 PRESENT   Final   Performed at [facility], [address], [place], [place] [address]  Admission on 07/05/2022, Discharged on 07/06/2022  Component Date Value Ref Range Status   SARS Coronavirus 2 by RT PCR 07/05/2022 NEGATIVE  NEGATIVE Final   Comment: (NOTE) SARS-CoV-2 target nucleic acids are NOT DETECTED.  The SARS-CoV-2 RNA is generally detectable in upper respiratory specimens during the acute phase of infection. The lowest concentration of SARS-CoV-2 viral copies this assay can detect is 138 copies/mL. A negative result does not preclude SARS-Cov-2 infection and should not be used as the sole basis for  treatment or other patient management decisions. A negative result may occur with  improper specimen collection/handling, submission of specimen other than nasopharyngeal swab, presence of viral mutation(s) within the areas targeted by this assay, and inadequate number of viral copies(<138 copies/mL). A negative result must be combined with clinical observations, patient history, and epidemiological information. The expected result is Negative.  Fact Sheet for Patients:  BloggerCourse.com  Fact Sheet for Healthcare Providers:  SeriousBroker.it  This test is no                          t yet approved or cleared by the [place] FDA and  has been authorized for detection and/or diagnosis of SARS-CoV-2 by FDA under an Emergency Use Authorization (EUA). This EUA will remain  in effect (meaning this test can be used) for the duration of the COVID-19 declaration under Section 564(b)(1) of the Act, 21 U.S.C.section 360bbb-3(b)(1), unless the authorization is terminated  or revoked sooner.       Influenza A by PCR 07/05/2022 NEGATIVE  NEGATIVE Final   Influenza B by PCR 07/05/2022 NEGATIVE  NEGATIVE Final   Comment: (NOTE) The Xpert Xpress SARS-CoV-2/FLU/RSV plus assay is intended as an aid in the diagnosis of influenza from Nasopharyngeal swab specimens and should not be used as a sole basis for treatment. Nasal washings and aspirates are unacceptable for Xpert Xpress SARS-CoV-2/FLU/RSV testing.  Fact Sheet for Patients: BloggerCourse.com  Fact Sheet for Healthcare Providers: SeriousBroker.it  This test is not yet approved or cleared by the [place] FDA and has been authorized for detection and/or diagnosis of SARS-CoV-2 by FDA under an Emergency Use Authorization (EUA). This EUA will remain in effect (meaning this test can be used) for the duration of the COVID-19  declaration under Section 564(b)(1) of the Act, 21 U.S.C. section 360bbb-3(b)(1), unless the authorization is terminated or revoked.     Resp Syncytial Virus by PCR 07/05/2022 NEGATIVE  NEGATIVE Final   Comment: (NOTE) Fact Sheet for Patients: BloggerCourse.com  Fact Sheet  for Healthcare Providers: SeriousBroker.it  This test is not yet approved or cleared by the [place] and has been authorized for detection and/or diagnosis of SARS-CoV-2 by FDA under an Emergency Use Authorization (EUA). This EUA will remain in effect (meaning this test can be used) for the duration of the COVID-19 declaration under Section 564(b)(1) of the Act, 21 U.S.C. section 360bbb-3(b)(1), unless the authorization is terminated or revoked.  Performed at [facility], [address], [place], [place] [address]    WBC 07/05/2022 6.9  4.0 - 10.5 K/uL Final   RBC 07/05/2022 3.75 (L)  4.22 - 5.81 MIL/uL Final   Hemoglobin 07/05/2022 13.3  13.0 - 17.0 g/dL Final   HCT 62/13/0865 37.9 (L)  39.0 - 52.0 % Final   MCV 07/05/2022 101.1 (H)  80.0 - 100.0 fL Final   MCH 07/05/2022 35.5 (H)  26.0 - 34.0 pg Final   MCHC 07/05/2022 35.1  30.0 - 36.0 g/dL Final   RDW 78/46/9629 12.4  11.5 - 15.5 % Final   Platelets 07/05/2022 199  150 - 400 K/uL Final   nRBC 07/05/2022 0.0  0.0 - 0.2 % Final   Neutrophils Relative % 07/05/2022 47  % Final   Neutro Abs 07/05/2022 3.2  1.7 - 7.7 K/uL Final   Lymphocytes Relative 07/05/2022 42  % Final   Lymphs Abs 07/05/2022 2.9  0.7 - 4.0 K/uL Final   Monocytes Relative 07/05/2022 9  % Final   Monocytes Absolute 07/05/2022 0.7  0.1 - 1.0 K/uL Final   Eosinophils Relative 07/05/2022 1  % Final   Eosinophils Absolute 07/05/2022 0.1  0.0 - 0.5 K/uL Final   Basophils Relative 07/05/2022 1  % Final   Basophils Absolute 07/05/2022 0.1  0.0 - 0.1 K/uL Final   Immature Granulocytes 07/05/2022 0  % Final   Abs Immature  Granulocytes 07/05/2022 0.01  0.00 - 0.07 K/uL Final   Performed at [facility], [address], [place], [place] [address]   Sodium 07/05/2022 140  135 - 145 mmol/L Final   Potassium 07/05/2022 3.7  3.5 - 5.1 mmol/L Final   Chloride 07/05/2022 102  98 - 111 mmol/L Final   CO2 07/05/2022 27  22 - 32 mmol/L Final   Glucose, Bld 07/05/2022 97  70 - 99 mg/dL Final   Glucose reference range applies only to samples taken after fasting for at least 8 hours.   BUN 07/05/2022 9  6 - 20 mg/dL Final   Creatinine, Ser 07/05/2022 1.13  0.61 - 1.24 mg/dL Final   Calcium 32/44/0102 9.3  8.9 - 10.3 mg/dL Final   Total Protein 72/53/6644 7.0  6.5 - 8.1 g/dL Final   Albumin 03/47/4259 4.1  3.5 - 5.0 g/dL Final   AST 56/38/7564 36  15 - 41 U/L Final   ALT 07/05/2022 22  0 - 44 U/L Final   Alkaline Phosphatase 07/05/2022 57  38 - 126 U/L Final   Total Bilirubin 07/05/2022 0.4  0.3 - 1.2 mg/dL Final   GFR, Estimated 07/05/2022 >60  >60 mL/min Final   Comment: (NOTE) Calculated using the CKD-EPI Creatinine Equation (2021)    Anion gap 07/05/2022 11  5 - 15 Final   Performed at [facility], [address] [place], [place] [address]   Hgb A1c MFr Bld 07/05/2022 4.9  4.8 - 5.6 % Final   Comment: (NOTE)         Prediabetes: 5.7 - 6.4  Diabetes: >6.4         Glycemic control for adults with diabetes: <7.0    Mean Plasma Glucose 07/05/2022 94  mg/dL Final   Comment: (NOTE) Performed At: [facility] [address] [place], [place] [id number] Jolene Schimke MD BM:[id number]    Alcohol, Ethyl (B) 07/05/2022 <10  <10 mg/dL Final   Comment: (NOTE) Lowest detectable limit for serum alcohol is 10 mg/dL.  For medical purposes only. Performed at [facility], [address] [place], [place] [address]    Cholesterol 07/05/2022 111  0 - 200 mg/dL Final   Triglycerides 66/44/0347 38  <150 mg/dL Final   HDL 42/59/5638 71  >40 mg/dL Final   Total CHOL/HDL Ratio 07/05/2022  1.6  RATIO Final   VLDL 07/05/2022 8  0 - 40 mg/dL Final   LDL Cholesterol 07/05/2022 32  0 - 99 mg/dL Final   Comment:        Total Cholesterol/HDL:CHD Risk Coronary Heart Disease Risk Table                     Men   Women  1/2 Average Risk   3.4   3.3  Average Risk       5.0   4.4  2 X Average Risk   9.6   7.1  3 X Average Risk  23.4   11.0        Use the calculated Patient Ratio above and the CHD Risk Table to determine the patient's CHD Risk.        ATP III CLASSIFICATION (LDL):  <100     mg/dL   Optimal  756-433  mg/dL   Near or Above                    Optimal  130-159  mg/dL   Borderline  295-188  mg/dL   High  >416     mg/dL   Very High Performed at [facility], [address], [place], [place] [address]    TSH 07/05/2022 0.914  0.350 - 4.500 uIU/mL Final   Comment: Performed by a 3rd Generation assay with a functional sensitivity of <=0.01 uIU/mL. Performed at [facility], [address] [place], [place] [address]    SARSCOV2ONAVIRUS 2 AG 07/05/2022 NEGATIVE  NEGATIVE Final   Comment: (NOTE) SARS-CoV-2 antigen NOT DETECTED.   Negative results are presumptive.  Negative results do not preclude SARS-CoV-2 infection and should not be used as the sole basis for treatment or other patient management decisions, including infection  control decisions, particularly in the presence of clinical signs and  symptoms consistent with COVID-19, or in those who have been in contact with the virus.  Negative results must be combined with clinical observations, patient history, and epidemiological information. The expected result is Negative.  Fact Sheet for Patients: https://www.jennings-kim.com/  Fact Sheet for Healthcare Providers: https://alexander-rogers.biz/  This test is not yet approved or cleared by the [place] FDA and  has been authorized for detection and/or diagnosis of SARS-CoV-2 by FDA under an Emergency Use  Authorization (EUA).  This EUA will remain in effect (meaning this test can be used) for the duration of  the COV                          ID-19 declaration under Section 564(b)(1) of the Act, 21 U.S.C. section 360bbb-3(b)(1), unless the authorization is terminated or  revoked sooner.    Admission on 05/16/2022, Discharged on 05/17/2022  Component Date Value Ref Range Status   Sodium 05/17/2022 135  135 - 145 mmol/L Final   Potassium 05/17/2022 3.7  3.5 - 5.1 mmol/L Final   Chloride 05/17/2022 102  98 - 111 mmol/L Final   CO2 05/17/2022 25  22 - 32 mmol/L Final   Glucose, Bld 05/17/2022 152 (H)  70 - 99 mg/dL Final   Glucose reference range applies only to samples taken after fasting for at least 8 hours.   BUN 05/17/2022 22 (H)  6 - 20 mg/dL Final   Creatinine, Ser 05/17/2022 1.41 (H)  0.61 - 1.24 mg/dL Final   Calcium 09/81/1914 9.2  8.9 - 10.3 mg/dL Final   Total Protein 78/29/5621 8.5 (H)  6.5 - 8.1 g/dL Final   Albumin 30/86/5784 4.8  3.5 - 5.0 g/dL Final   AST 69/62/9528 47 (H)  15 - 41 U/L Final   ALT 05/17/2022 30  0 - 44 U/L Final   Alkaline Phosphatase 05/17/2022 65  38 - 126 U/L Final   Total Bilirubin 05/17/2022 0.7  0.3 - 1.2 mg/dL Final   GFR, Estimated 05/17/2022 >60  >60 mL/min Final   Comment: (NOTE) Calculated using the CKD-EPI Creatinine Equation (2021)    Anion gap 05/17/2022 8  5 - 15 Final   Performed at [facility], [address], [place], [place] [address]   Alcohol, Ethyl (B) 05/17/2022 <10  <10 mg/dL Final   Comment: (NOTE) Lowest detectable limit for serum alcohol is 10 mg/dL.  For medical purposes only. Performed at [facility], [address] [place], [place] [address]    Salicylate Lvl 05/17/2022 <7.0 (L)  7.0 - 30.0 mg/dL Final   Performed at [facility], [address], [place], [place] [address]   Acetaminophen (Tylenol), Serum 05/17/2022 <10 (L)  10 - 30 ug/mL Final   Comment:  (NOTE) Therapeutic concentrations vary significantly. A range of 10-30 ug/mL  may be an effective concentration for many patients. However, some  are best treated at concentrations outside of this range. Acetaminophen concentrations >150 ug/mL at 4 hours after ingestion  and >50 ug/mL at 12 hours after ingestion are often associated with  toxic reactions.  Performed at [facility], [address] [place], [place] [address]    WBC 05/17/2022 7.2  4.0 - 10.5 K/uL Final   RBC 05/17/2022 4.33  4.22 - 5.81 MIL/uL Final   Hemoglobin 05/17/2022 14.9  13.0 - 17.0 g/dL Final   HCT 47/42/5956 44.2  39.0 - 52.0 % Final   MCV 05/17/2022 102.1 (H)  80.0 - 100.0 fL Final   MCH 05/17/2022 34.4 (H)  26.0 - 34.0 pg Final   MCHC 05/17/2022 33.7  30.0 - 36.0 g/dL Final   RDW 38/75/6433 12.4  11.5 - 15.5 % Final   Platelets 05/17/2022 191  150 - 400 K/uL Final   nRBC 05/17/2022 0.0  0.0 - 0.2 % Final   Performed at [name], [address] [place], [place] [address]   Opiates 05/17/2022 NONE DETECTED  NONE DETECTED Final   Cocaine 05/17/2022 POSITIVE (A)  NONE DETECTED Final   Benzodiazepines 05/17/2022 NONE DETECTED  NONE DETECTED Final   Amphetamines 05/17/2022 NONE DETECTED  NONE DETECTED Final   Tetrahydrocannabinol 05/17/2022 POSITIVE (A)  NONE DETECTED Final   Barbiturates 05/17/2022 NONE DETECTED  NONE DETECTED Final   Comment: (NOTE) DRUG SCREEN  FOR MEDICAL PURPOSES ONLY.  IF CONFIRMATION IS NEEDED FOR ANY PURPOSE, NOTIFY LAB WITHIN 5 DAYS.  LOWEST DETECTABLE LIMITS FOR URINE DRUG SCREEN Drug Class                     Cutoff (ng/mL) Amphetamine and metabolites    1000 Barbiturate and metabolites    200 Benzodiazepine                 200 Opiates and metabolites        300 Cocaine and metabolites        300 THC                            50 Performed at [facility], [address], [place], [place] [address]     Blood  Alcohol level:  Lab Results  Component Value Date   ETH <10 10/17/2022   ETH 69 (H) 09/22/2022    Metabolic Disorder Labs: Lab Results  Component Value Date   HGBA1C 4.9 09/18/2022   MPG 94 09/18/2022   MPG 94 07/05/2022   No results found for: "PROLACTIN" Lab Results  Component Value Date   CHOL 135 09/18/2022   TRIG 72 09/18/2022   HDL 72 09/18/2022   CHOLHDL 1.9 09/18/2022   VLDL 14 09/18/2022   LDLCALC 49 09/18/2022   LDLCALC 32 07/05/2022    Therapeutic Lab Levels: No results found for: "LITHIUM" No results found for: "VALPROATE" No results found for: "CBMZ"  Physical Findings   PHQ2-9    Flowsheet Row ED from 09/22/2022 in [facility] ED from 09/18/2022 in [facility] ED from 11/05/2021 in [facility]  PHQ-2 Total Score PHQ-9 Total Score Flowsheet Row ED from 10/30/2022 in [facility] ED from 10/26/2022 in [facility] Emergency Department at [facility] ED from 10/22/2022 in [facility] Emergency Department at [facility]  C-SSRS RISK CATEGORY No Risk No Risk No Risk        Musculoskeletal  Strength & Muscle Tone: within normal limits Gait & Station: normal Patient leans: N/A  Psychiatric Specialty Exam  Presentation  General Appearance:  Casual  Eye Contact: Fair  Speech: Clear and Coherent  Speech Volume: Decreased  Handedness: Right   Mood and Affect  Mood: Anxious  Affect: Labile   Thought Process  Thought Processes: Coherent  Descriptions of Associations:Circumstantial  Orientation:Full (Time, Place and Person)  Thought Content:WDL  Diagnosis of Schizophrenia or Schizoaffective disorder in past: No    Hallucinations:Hallucinations: None  Ideas of Reference:None  Suicidal Thoughts:Suicidal Thoughts: No  Homicidal Thoughts:Homicidal Thoughts: No   Sensorium   Memory: Immediate Fair  Judgment: Poor  Insight: Poor   Executive Functions  Concentration: Fair  Attention Span: Good  Recall: Fair  Fund of Knowledge: Fair  Language: Fair   Psychomotor Activity  Psychomotor Activity: Psychomotor Activity: Normal   Assets  Assets: Desire for Improvement   Sleep  Sleep: Sleep: Fair Number of Hours of Sleep: 6   Nutritional Assessment (For OBS and FBC admissions only) Has the patient had a weight loss or gain of 10 pounds or more in the last 3 months?: No Has the patient had a decrease in food intake/or appetite?: No Does the patient have dental problems?: No Does the patient have eating  habits or behaviors that may be indicators of an eating disorder including binging or inducing vomiting?: No Has the patient recently lost weight without trying?: 0 Has the patient been eating poorly because of a decreased appetite?: 0 Malnutrition Screening Tool Score: 0    Physical Exam  Physical Exam Vitals and nursing note reviewed.  Cardiovascular:     Rate and Rhythm: Normal rate and regular rhythm.  Pulmonary:     Effort: Pulmonary effort is normal.     Breath sounds: Normal breath sounds.  Abdominal:     General: Abdomen is flat.  Neurological:     Mental Status: He is oriented to person, place, and time.  Psychiatric:        Mood and Affect: Mood normal.        Behavior: Behavior normal.    Review of Systems  Psychiatric/Behavioral:  Positive for substance abuse. Negative for depression and suicidal ideas. The patient is nervous/anxious.   All other systems reviewed and are negative.  Blood pressure 122/72, pulse (!) 56, temperature 100 F (37.8 C), temperature source Oral, resp. rate 17, SpO2 100 %. There is no height or weight on file to calculate BMI.  Treatment Plan Summary: Daily contact with patient to assess and evaluate symptoms and progress in treatment and Medication management  Continue with  current treatment plan on 10/31/2022 as listed below except for noted  -Continue CIWA protocol Ativan -Follow-up with CSW for additional outpatient resources for discharge planning -Patient reported seeking [facility] [place] facility  Oneta Rack, NP 10/31/2022 9:15 AM

## 2022-10-31 NOTE — Progress Notes (Signed)
Patient refused all of his morning medication. Rn went over the importantance of taking his medication.Will continue to monitor for safety.

## 2022-10-31 NOTE — ED Notes (Signed)
Patient is requesting discharge Rn notifying provider.

## 2022-10-31 NOTE — ED Notes (Signed)
Patient is discharged.

## 2022-10-31 NOTE — ED Notes (Signed)
Patient alert and oriented x 3. Denies SI/HI/AVH. Denies intent or plan to harm self or others. Routine conducted according to faculty protocol. Encourage patient to notify staff with any needs or concerns. Patient verbalized agreement and understanding. Will continue to monitor for safety. 

## 2022-10-31 NOTE — ED Notes (Signed)
Patient refused moring medication notified the provider.

## 2022-10-31 NOTE — ED Provider Notes (Signed)
FBC/OBS ASAP Discharge Summary  Date and Time: 10/31/2022 12:51 PM  Name: Calvin [name]  MRN:  [id number]   Discharge Diagnoses:  Final diagnoses:  Cocaine abuse  Marijuana abuse  Alcohol abuse  Streptococcal infection group A    Subjective: ***  Stay Summary: ***  Total Time spent with patient: {Time; 15 min - 8 hours:17441}  Past Psychiatric History: *** Past Medical History: *** Family History: *** Family Psychiatric History: *** Social History: *** Tobacco Cessation:  {Discharge tobacco cessation prescription:304700209}  Current Medications:  Current Facility-Administered Medications  Medication Dose Route Frequency Provider Last Rate Last Admin   alum & mag hydroxide-simeth (MAALOX/MYLANTA) 200-200-20 MG/5ML suspension 30 mL  30 mL Oral Q4H PRN Sindy Guadeloupe, NP       hydrOXYzine (ATARAX) tablet 25 mg  25 mg Oral Q6H PRN Sindy Guadeloupe, NP       loperamide (IMODIUM) capsule 2-4 mg  2-4 mg Oral PRN Sindy Guadeloupe, NP       LORazepam (ATIVAN) tablet 1 mg  1 mg Oral Q6H PRN Sindy Guadeloupe, NP       LORazepam (ATIVAN) tablet 1 mg  1 mg Oral QID Sindy Guadeloupe, NP   1 mg at 10/30/22 2152   Followed by   LORazepam (ATIVAN) tablet 1 mg  1 mg Oral TID Sindy Guadeloupe, NP       Followed by   Melene Muller ON 11/01/2022] LORazepam (ATIVAN) tablet 1 mg  1 mg Oral BID Sindy Guadeloupe, NP       Followed by   Melene Muller ON 11/03/2022] LORazepam (ATIVAN) tablet 1 mg  1 mg Oral Daily Sindy Guadeloupe, NP       OLANZapine zydis (ZYPREXA) disintegrating tablet 5 mg  5 mg Oral Q8H PRN Sindy Guadeloupe, NP       And   LORazepam (ATIVAN) tablet 1 mg  1 mg Oral PRN Sindy Guadeloupe, NP       And   ziprasidone (GEODON) injection 20 mg  20 mg Intramuscular PRN Sindy Guadeloupe, NP       magnesium hydroxide (MILK OF MAGNESIA) suspension 30 mL  30 mL Oral Daily PRN Sindy Guadeloupe, NP       multivitamin with minerals tablet 1 tablet  1 tablet Oral Daily Sindy Guadeloupe, NP   1 tablet at 10/30/22 2152   ondansetron  (ZOFRAN-ODT) disintegrating tablet 4 mg  4 mg Oral Q6H PRN Sindy Guadeloupe, NP       thiamine (VITAMIN B1) tablet 100 mg  100 mg Oral Daily Sindy Guadeloupe, NP       Current Outpatient Medications  Medication Sig Dispense Refill   Melatonin 10 MG TABS Take 10 mg by mouth at bedtime as needed (For sleep).     amoxicillin (AMOXIL) 500 MG capsule Take 1 capsule (500 mg total) by mouth 2 (two) times daily for 10 days. (Patient not taking: Reported on 10/31/2022) 20 capsule 0    PTA Medications:  Facility Ordered Medications  Medication   alum & mag hydroxide-simeth (MAALOX/MYLANTA) 200-200-20 MG/5ML suspension 30 mL   magnesium hydroxide (MILK OF MAGNESIA) suspension 30 mL   [COMPLETED] thiamine (VITAMIN B1) injection 100 mg   thiamine (VITAMIN B1) tablet 100 mg   multivitamin with minerals tablet 1 tablet   LORazepam (ATIVAN) tablet 1 mg   hydrOXYzine (ATARAX) tablet 25 mg   loperamide (IMODIUM) capsule 2-4 mg   ondansetron (ZOFRAN-ODT) disintegrating tablet 4 mg   LORazepam (ATIVAN) tablet 1 mg   Followed by  LORazepam (ATIVAN) tablet 1 mg   Followed by   Melene Muller ON 11/01/2022] LORazepam (ATIVAN) tablet 1 mg   Followed by   Melene Muller ON 11/03/2022] LORazepam (ATIVAN) tablet 1 mg   OLANZapine zydis (ZYPREXA) disintegrating tablet 5 mg   And   LORazepam (ATIVAN) tablet 1 mg   And   ziprasidone (GEODON) injection 20 mg   PTA Medications  Medication Sig   Melatonin 10 MG TABS Take 10 mg by mouth at bedtime as needed (For sleep).   amoxicillin (AMOXIL) 500 MG capsule Take 1 capsule (500 mg total) by mouth 2 (two) times daily for 10 days. (Patient not taking: Reported on 10/31/2022)       10/31/2022   12:50 PM 09/25/2022    8:59 AM 09/22/2022    4:58 PM  Depression screen PHQ 2/9  Decreased Interest Down, Depressed, Hopeless 2 0 1  PHQ - 2 Score Altered sleeping 2 0 2  Tired, decreased energy Change in appetite 0 0 2  Feeling bad or failure about yourself  2 0    Trouble concentrating 2 0 1  Moving slowly or fidgety/restless 2 0 1  Suicidal thoughts 0 0 0  PHQ-9 Score Difficult doing work/chores Somewhat difficult Not difficult at all Somewhat difficult    Flowsheet Row ED from [facility]30/[facility] in [facility] ED from 10/26/2022 in [facility] Emergency Department at [facility] ED from 10/22/2022 in [facility] Emergency Department at [facility]  C-SSRS RISK CATEGORY Error: Question 6 not populated No Risk No Risk       Musculoskeletal  Strength & Muscle Tone: {desc; muscle tone:32375} Gait & Station: {PE GAIT ED BJYN:82956} Patient leans: {Patient Leans:21022755}  Psychiatric Specialty Exam  Presentation  General Appearance:  Casual  Eye Contact: Fair  Speech: Clear and Coherent  Speech Volume: Decreased  Handedness: Right   Mood and Affect  Mood: Anxious  Affect: Labile   Thought Process  Thought Processes: Coherent  Descriptions of Associations:Circumstantial  Orientation:Full (Time, Place and Person)  Thought Content:WDL  Diagnosis of Schizophrenia or Schizoaffective disorder in past: No    Hallucinations:Hallucinations: None  Ideas of Reference:None  Suicidal Thoughts:Suicidal Thoughts: No  Homicidal Thoughts:Homicidal Thoughts: No   Sensorium  Memory: Immediate Fair  Judgment: Poor  Insight: Poor   Executive Functions  Concentration: Fair  Attention Span: Good  Recall: Fair  Fund of Knowledge: Fair  Language: Fair   Psychomotor Activity  Psychomotor Activity: Psychomotor Activity: Normal   Assets  Assets: Desire for Improvement   Sleep  Sleep: Sleep: Fair Number of Hours of Sleep: 6   Nutritional Assessment (For OBS and FBC admissions only) Has the patient had a weight loss or gain of 10 pounds or more in the last 3 months?: No Has the patient had a decrease in food intake/or appetite?: No Does the patient have  dental problems?: No Does the patient have eating habits or behaviors that may be indicators of an eating disorder including binging or inducing vomiting?: No Has the patient recently lost weight without trying?: 0 Has the patient been eating poorly because of a decreased appetite?: 0 Malnutrition Screening Tool Score: 0    Physical Exam  Physical Exam ROS Blood pressure 122/72, pulse 60, temperature 97.7 F (36.5 C), temperature source Oral, resp. rate 17, SpO2 100 %. There is no height or weight on file to calculate BMI.  Demographic Factors:  {Demographic Factors:20662}  Loss Factors: {Loss Factors:20659}  Historical Factors: {Historical Factors:20660}  Risk Reduction Factors:   {Risk Reduction Factors:20661}  Continued Clinical Symptoms:  {Clinical Factors:22706}  Cognitive Features That Contribute To Risk:  {chl bhh Cognitive Features:304700251}    Suicide Risk:  {BHH SUICIDE NGEX:52841}  Plan Of Care/Follow-up recommendations:  {BHH DC FU RECOMMENDATIONS:22620}  Disposition: ***  Joaquin Courts, NP 10/31/2022, 12:51 PM

## 2022-10-31 NOTE — ED Notes (Signed)
Patient A&O x 4, ambulatory. Patient discharged in no acute distress. Patient denied SI/HI, A/VH upon discharge. Patient verbalized understanding of all discharge instructions explained by staff, to include follow up appointments, RX's and safety plan. Patient reported mood 10/10.  Pt belongings returned to patient from locker #  intact. Patient escorted to lobby via staff for transport to destination. Safety maintained.   

## 2022-10-31 NOTE — Discharge Instructions (Addendum)
Per review of labs you positive for strep A infection. Your provider has sent medication to the pharmacy. Please start medication today.    [facility]  [facility]: [phone]: Ask about specific location [facility]: [phone] [facility]: Male and Male facility; [phone] [facility] in [place], [place]: [phone] [facility]: [name] Admissions Coordinator [phone] [facility] in [place], [place] [phone] or [phone] [facility]: Main: [phone]  [facility] [email or web address]  12 STEP PROGRAMS:  Alcoholics Anonymous of [place] [employer]et[employer]  Narcotics Anonymous of [place] [employer]  Al-Anon of [place], [place] www.[place]alanon.org/find-meetings.html  Nar-Anon https://nar-anon.org/find-[email or web address]-meetin

## 2022-10-31 NOTE — Tx Team (Signed)
LCSW and MD spoke with patient in his room on this morning regarding readmission. Patient reports he presented because he got discharged from [facility] and then returned back to using cocaine, alcohol, and marijuana. Patient reports needing assistance with detox and reports his plan is to go to [facility] [place] or get into an [facility] at discharge. Patient reports his interest in in [place] and [place]. Contact information printed and provided to the patient for his follow up. Patient reports he will begin making calls once he wakes up from his nap. No other needs were reported at this time. Additional resources to be provided in AVS.   Fernande Boyden, LCSW Clinical Social Worker [facility] Ph: [phone]

## 2022-10-31 NOTE — ED Notes (Signed)
Patient refused to go to group. 

## 2022-10-31 NOTE — ED Notes (Signed)
Pt is sleeping. No distress noted. Will continue to monitor for safety. 

## 2022-10-31 NOTE — ED Notes (Addendum)
Patient was informed that it is currently lunch time 

## 2022-10-31 NOTE — ED Notes (Signed)
Patient  sleeping in no acute stress. RR even and unlabored .Environment secured .Will continue to monitor for safely. 

## 2022-10-31 NOTE — ED Notes (Signed)
Patient refused to do safety pain and discharge survey.

## 2023-01-23 ENCOUNTER — Encounter (HOSPITAL_COMMUNITY): Payer: Self-pay

## 2023-01-23 ENCOUNTER — Ambulatory Visit (INDEPENDENT_AMBULATORY_CARE_PROVIDER_SITE_OTHER)
Admission: EM | Admit: 2023-01-23 | Discharge: 2023-01-24 | Disposition: A | Discharge status: Home / Self Care | Payer: Commercial Managed Care - HMO | Source: Home / Self Care

## 2023-01-23 ENCOUNTER — Emergency Department (HOSPITAL_COMMUNITY)
Admission: EM | Admit: 2023-01-23 | Discharge: 2023-01-23 | Disposition: A | Discharge status: Home / Self Care | Payer: Commercial Managed Care - HMO | Source: Home / Self Care | Attending: Emergency Medicine | Admitting: Emergency Medicine

## 2023-01-23 ENCOUNTER — Other Ambulatory Visit: Payer: Self-pay

## 2023-01-23 ENCOUNTER — Emergency Department (HOSPITAL_COMMUNITY): Payer: Commercial Managed Care - HMO

## 2023-01-23 ENCOUNTER — Emergency Department (HOSPITAL_COMMUNITY)
Admission: EM | Admit: 2023-01-23 | Discharge: 2023-01-23 | Disposition: A | Discharge status: Home / Self Care | Payer: Commercial Managed Care - HMO | Attending: Emergency Medicine | Admitting: Emergency Medicine

## 2023-01-23 DIAGNOSIS — F1410 Cocaine abuse, uncomplicated: Secondary | ICD-10-CM | POA: Insufficient documentation

## 2023-01-23 DIAGNOSIS — R440 Auditory hallucinations: Secondary | ICD-10-CM | POA: Insufficient documentation

## 2023-01-23 DIAGNOSIS — F1010 Alcohol abuse, uncomplicated: Secondary | ICD-10-CM

## 2023-01-23 DIAGNOSIS — F17200 Nicotine dependence, unspecified, uncomplicated: Secondary | ICD-10-CM | POA: Insufficient documentation

## 2023-01-23 DIAGNOSIS — J45909 Unspecified asthma, uncomplicated: Secondary | ICD-10-CM | POA: Insufficient documentation

## 2023-01-23 DIAGNOSIS — F1910 Other psychoactive substance abuse, uncomplicated: Secondary | ICD-10-CM | POA: Insufficient documentation

## 2023-01-23 DIAGNOSIS — R1030 Lower abdominal pain, unspecified: Secondary | ICD-10-CM | POA: Diagnosis present

## 2023-01-23 DIAGNOSIS — R109 Unspecified abdominal pain: Secondary | ICD-10-CM

## 2023-01-23 LAB — COMPREHENSIVE METABOLIC PANEL
ALT: 31 U/L (ref 0–44)
AST: 35 U/L (ref 15–41)
Albumin: 3.9 g/dL (ref 3.5–5.0)
Alkaline Phosphatase: 56 U/L (ref 38–126)
Anion gap: 8 (ref 5–15)
BUN: 10 mg/dL (ref 6–20)
CO2: 27 mmol/L (ref 22–32)
Calcium: 9 mg/dL (ref 8.9–10.3)
Chloride: 103 mmol/L (ref 98–111)
Creatinine, Ser: 1.08 mg/dL (ref 0.61–1.24)
GFR, Estimated: >60 mL/min (ref >60)
Glucose, Bld: 88 mg/dL (ref 70–99)
Potassium: 3.7 mmol/L (ref 3.5–5.1)
Sodium: 138 mmol/L (ref 135–145)
Total Bilirubin: 0.5 mg/dL (ref 0.3–1.2)
Total Protein: 7.1 g/dL (ref 6.5–8.1)

## 2023-01-23 LAB — RAPID URINE DRUG SCREEN, HOSP PERFORMED
Amphetamines: NOT DETECTED
Barbiturates: NOT DETECTED
Benzodiazepines: NOT DETECTED
Cocaine: POSITIVE — AB
Opiates: NOT DETECTED
Tetrahydrocannabinol: POSITIVE — AB

## 2023-01-23 LAB — CBC
HCT: 41.6 % (ref 39.0–52.0)
Hemoglobin: 13.5 g/dL (ref 13.0–17.0)
MCH: 33.6 pg (ref 26.0–34.0)
MCHC: 32.5 g/dL (ref 30.0–36.0)
MCV: 103.5 fL — ABNORMAL HIGH (ref 80.0–100.0)
Platelets: 226 10*3/uL (ref 150–400)
RBC: 4.02 MIL/uL — ABNORMAL LOW (ref 4.22–5.81)
RDW: 12.5 % (ref 11.5–15.5)
WBC: 7.2 10*3/uL (ref 4.0–10.5)
nRBC: 0 % (ref 0.0–0.2)

## 2023-01-23 LAB — URINALYSIS, ROUTINE W REFLEX MICROSCOPIC
Bilirubin Urine: NEGATIVE
Glucose, UA: NEGATIVE mg/dL
Ketones, ur: NEGATIVE mg/dL
Leukocytes,Ua: NEGATIVE
Nitrite: NEGATIVE
Protein, ur: NEGATIVE mg/dL
Specific Gravity, Urine: >1.03 — ABNORMAL HIGH (ref 1.005–1.030)
pH: 5.5 (ref 5.0–8.0)

## 2023-01-23 LAB — URINALYSIS, MICROSCOPIC (REFLEX): Bacteria, UA: NONE SEEN

## 2023-01-23 LAB — ETHANOL: Alcohol, Ethyl (B): 11 mg/dL — ABNORMAL HIGH (ref <10)

## 2023-01-23 LAB — LIPID PANEL
Cholesterol: 126 mg/dL (ref 0–200)
HDL: 59 mg/dL (ref >40)
LDL Cholesterol: 47 mg/dL (ref 0–99)
Total CHOL/HDL Ratio: 2.1 RATIO
Triglycerides: 100 mg/dL (ref <150)
VLDL: 20 mg/dL (ref 0–40)

## 2023-01-23 LAB — LIPASE, BLOOD: Lipase: 36 U/L (ref 11–51)

## 2023-01-23 MED ORDER — NAPROXEN 250 MG PO TABS
500.0000 mg | ORAL_TABLET | Freq: Once | ORAL | Status: AC
  Administered 2023-01-23: 500 mg via ORAL
  Filled 2023-01-23: qty 2

## 2023-01-23 MED ORDER — THIAMINE MONONITRATE 100 MG PO TABS: 100.0000 mg | ORAL_TABLET | Freq: Every day | ORAL | Status: DC

## 2023-01-23 MED ORDER — MAGNESIUM HYDROXIDE 400 MG/5ML PO SUSP: 30.0000 mL | Freq: Every day | ORAL | Status: DC | PRN

## 2023-01-23 MED ORDER — HYDROXYZINE HCL 25 MG PO TABS: 25.0000 mg | ORAL_TABLET | Freq: Four times a day (QID) | ORAL | Status: DC | PRN

## 2023-01-23 MED ORDER — LORAZEPAM 1 MG PO TABS: 1.0000 mg | ORAL_TABLET | Freq: Four times a day (QID) | ORAL | Status: DC | PRN

## 2023-01-23 MED ORDER — LOPERAMIDE HCL 2 MG PO CAPS: 2.0000 mg | ORAL_CAPSULE | ORAL | Status: DC | PRN

## 2023-01-23 MED ORDER — NAPROXEN 500 MG PO TABS: 500.0000 mg | ORAL_TABLET | Freq: Two times a day (BID) | ORAL | 0 refills | Status: DC

## 2023-01-23 MED ORDER — DICYCLOMINE HCL 20 MG PO TABS: 20.0000 mg | ORAL_TABLET | Freq: Two times a day (BID) | ORAL | 0 refills | Status: DC

## 2023-01-23 MED ORDER — ADULT MULTIVITAMIN W/MINERALS CH
1.0000 | ORAL_TABLET | Freq: Every day | ORAL | Status: DC
  Administered 2023-01-23: 1 via ORAL
  Filled 2023-01-23: qty 1

## 2023-01-23 MED ORDER — HYDROXYZINE HCL 25 MG PO TABS: 25.0000 mg | ORAL_TABLET | Freq: Three times a day (TID) | ORAL | Status: DC | PRN

## 2023-01-23 MED ORDER — ONDANSETRON 4 MG PO TBDP: 4.0000 mg | ORAL_TABLET | Freq: Four times a day (QID) | ORAL | Status: DC | PRN

## 2023-01-23 MED ORDER — CHLORDIAZEPOXIDE HCL 25 MG PO CAPS: ORAL_CAPSULE | ORAL | 0 refills | Status: DC

## 2023-01-23 MED ORDER — ALUM & MAG HYDROXIDE-SIMETH 200-200-20 MG/5ML PO SUSP: 30.0000 mL | ORAL | Status: DC | PRN

## 2023-01-23 MED ORDER — THIAMINE HCL 100 MG/ML IJ SOLN
100.0000 mg | Freq: Once | INTRAMUSCULAR | Status: AC
  Administered 2023-01-23: 100 mg via INTRAMUSCULAR
  Filled 2023-01-23: qty 2

## 2023-01-23 NOTE — Discharge Instructions (Signed)
You were evaluated in the Emergency Department and after careful evaluation, we did not find any emergent condition requiring admission or further testing in the hospital.  Your exam/testing today is overall reassuring.  X-ray was normal, blood test also normal.  Recommend follow-up with the gastroenterologist to discuss your symptoms.  Use the Naprosyn twice daily for pain.  Also recommend trying the Bentyl as needed.  Please return to the Emergency Department if you experience any worsening of your condition.   Thank you for allowing [place] to be a part of your care.

## 2023-01-23 NOTE — ED Notes (Signed)
Patient  sleeping in no acute stress. RR even and unlabored .Environment secured .Will continue to monitor for safely. 
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
## 2023-01-23 NOTE — ED Notes (Addendum)
Patient Alert and Oriented X 3.Denies SI/HI/AVH Patient contracted for safety. Is currently calm and cooperative. Safety of environment ensured. Skin check conducted by Delice Bison, RN and Cala Bradford, RN. Patient's item locked in locker #11 (shorts, socks, hoodie, bus pass, ATM card). Will continue to monitor for safety.

## 2023-01-23 NOTE — ED Notes (Signed)
Pt A&O x 4, sleeping at present, no distress noted.  Pending [place] in am.  Monitoring for safety.
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
## 2023-01-23 NOTE — Progress Notes (Signed)
   01/23/23 0714  [facility] Triage Screening (Walk-ins at [facility] only)  How Did You Hear About [place]? Self  What Is the Reason for Your Visit/Call Today? Pt is a 37 yo male who presents voluntarily to [facility] for detox from alcohol, marijuana, and cocaine. Pt states he snorted 3g of cocaine, drank 1 pint of liquor and 4 grams of marijuana approximately 2-3 hours ago. Pt reports that he is currently struggling with depression and anxiety. Pt reports feelings of hopelessness and sadness. Pt reports that he currently worries a lot about life stressors.  Patient deny any recent manic symptoms. Patient denies SI, HI, and visual hallucinations. Pt reports hearing voices and noises as recent as of today. Patient denies access to guns or weapons. Pt reports that he does have any outpatient services at this time.  How Long Has This Been Causing You Problems? > than 6 months  Have You Recently Had Any Thoughts About Hurting Yourself? No  Are You Planning to Commit Suicide/Harm Yourself At This time? No  Have you Recently Had Thoughts About Hurting Someone [name]? No  Are You Planning To Harm Someone At This Time? No  Are you currently experiencing any auditory, visual or other hallucinations? Yes  Please explain the hallucinations you are currently experiencing: Auditory Hallucinations: Pt reports hearing voices and noises.  Have You Used Any Alcohol or Drugs in the Past 24 Hours? Yes  How long ago did you use Drugs or Alcohol? 2-3 hours ago  What Did You Use and How Much? alcohol, cocaine, and marijuana  Do you have any current medical co-morbidities that require immediate attention? No  Clinician description of patient physical appearance/behavior: Pt is calm and cooperative but present depressed with flat affect .  What Do You Feel Would Help You the Most Today? Alcohol or Drug Use Treatment;Treatment for Depression or other mood problem;Stress Management;Medication(s)  Determination of Need Urgent (48 hours)  Options  For Referral Facility-Based Crisis    Flowsheet Row ED from 01/23/2023 in [facility] Most recent reading at 01/23/2023  7:37 AM ED from 01/23/2023 in [facility] Emergency Department at [facility] Most recent reading at 01/23/2023  4:33 AM ED from 01/23/2023 in [facility] Emergency Department at [facility] Most recent reading at 01/23/2023  2:09 AM  C-SSRS RISK CATEGORY No Risk No Risk No Risk

## 2023-01-23 NOTE — Discharge Instructions (Addendum)
Your labs not show any concerning findings at this time.  We sent a prescription for Librium, take as directed.  Do not consume alcohol while you are taking this medications.  Attached is information for the behavioral urgent care center, you may follow-up with them.  Resources also attached to help with detox.  Return to the ED for experience increasing/worsening symptoms.

## 2023-01-23 NOTE — ED Notes (Signed)
Provider states patient does not need the cbc cmp or drug screen

## 2023-01-23 NOTE — ED Notes (Signed)
Notified provider that patient had labs drawn earlier and that he is refusing them now.

## 2023-01-23 NOTE — ED Triage Notes (Signed)
Pt is coming here for bilateral lower abd pain, has been seen recently for same. States it does hurt when he urinates some but its occasional. Pt had diarrhea, and nausea but no vomiting. States the pain has been going on for a week and it come in intermittent spell of pain that is not continuous.
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
## 2023-01-23 NOTE — ED Provider Notes (Deleted)
Behavioral Health Urgent Care Medical Screening Exam  Patient Name: Calvin [name] MRN: [id number] Date of Evaluation: 01/23/23 Chief Complaint:  " My birthday is tomorrow and I would like to detox." Diagnosis:  Final diagnoses:  None    History of Present illness: Calvin [name] is a 37 y.o. male.  Presents to [facility] urgent care requesting detox from cocaine, ecstasy alcohol and mushrooms.  He reports frequent daily use.  Reports last use earlier this morning.  States he was seen and evaluated by the local emergency department and A bus to be evaluated here.  States he has detox in the past.  Chart review patient was treated at [facility] treatment facility and had been attending sober living of [place].  He denies that he takes any medications for mental health.  Does report struggling with depression and anxiety.  He is denying suicidal or homicidal ideations.  Denies auditory visual hallucinations.  Calvin [name] denied that he is currently employed.  Reports paranoia. "  I think everyone is out to get me."  Patient was recently hospitalized however denied that he is followed up with outpatient services.   During evaluation Calvin [name] is sitting: he is alert/oriented x 4; calm/cooperative; and mood congruent with affect.  Patient is speaking in a clear tone at moderate volume, and normal pace; with good eye contact.  His thought process is coherent and relevant; There is no indication that he is currently responding to internal/external stimuli or experiencing delusional thought content.  Patient denies suicidal/self-harm/homicidal ideation or psychosis.  Patient has remained calm throughout assessment and has answered questions appropriately.   Flowsheet Row ED from 01/23/2023 in [facility] Most recent reading at 01/23/2023  7:37 AM ED from 01/23/2023 in [facility] Emergency Department at [facility] Most recent reading at 01/23/2023  4:33 AM ED from  01/23/2023 in [name] Emergency Department at [facility] Most recent reading at 01/23/2023  2:09 AM  C-SSRS RISK CATEGORY No Risk No Risk No Risk       Psychiatric Specialty Exam  Presentation  General Appearance:Appropriate for Environment  Eye Contact:Good  Speech:Clear and Coherent  Speech Volume:Normal  Handedness:Right   Mood and Affect  Mood: Anxious  Affect: Appropriate; Congruent   Thought Process  Thought Processes: Coherent  Descriptions of Associations:Intact  Orientation:Full (Time, Place and Person)  Thought Content:Logical  Diagnosis of Schizophrenia or Schizoaffective disorder in past: No   Hallucinations:Other (comment) "voices talking about me."  Ideas of Reference:None  Suicidal Thoughts:No  Homicidal Thoughts:No   Sensorium  Memory: Immediate Fair; Remote Good  Judgment: Good  Insight: Good   Executive Functions  Concentration: Good  Attention Span: Good  Recall: Fair  Fund of Knowledge: Fair  Language: Good   Psychomotor Activity  Psychomotor Activity: Normal   Assets  Assets: Desire for Improvement   Sleep  Sleep: Fair  Number of hours:  6   Physical Exam: Physical Exam Vitals and nursing note reviewed.  Constitutional:      Appearance: Normal appearance.  Cardiovascular:     Rate and Rhythm: Normal rate and regular rhythm.  Neurological:     Mental Status: He is alert and oriented to person, place, and time.  Psychiatric:        Mood and Affect: Mood normal.        Behavior: Behavior normal.        Thought Content: Thought content normal.    Review of Systems  Psychiatric/Behavioral:  Positive for depression and substance abuse. Negative for suicidal ideas. The patient is nervous/anxious.   All other systems reviewed and are negative.  Blood pressure 108/63, pulse 74, temperature 97.7 F (36.5 C), temperature source Oral, resp. rate 18, SpO2 98 %. There is no height or  weight on file to calculate BMI.  Musculoskeletal: Strength & Muscle Tone: within normal limits Gait & Station: normal Patient leans: N/A   [facility] MSE Discharge Disposition for Follow up and Recommendations: Based on my evaluation the patient does not appear to have an emergency medical condition and can be discharged with resources and follow up care in outpatient services for SW to follow-up    Calvin Rack, NP 01/23/2023, 8:06 AM

## 2023-01-23 NOTE — ED Provider Notes (Cosign Needed Addendum)
[name] Continuous Assessment Admission H&P  Date: 01/23/23 Patient Name: Calvin [name] MRN: [id number] Chief Complaint:   " My birthday is tomorrow and I would like to detox."   Diagnoses:  Final diagnoses:  Polysubstance abuse (HCC)  Substance abuse (HCC)    HPI: History of Present illness: Calvin [name] is a 37 y.o. male.  Presents to [facility] urgent care requesting detox from cocaine, ecstasy alcohol and mushrooms.  He reports frequent daily use.  Reports last use earlier this morning.  States he was seen and evaluated by the local emergency department and caught the bus to be evaluated here for detox.   States he has detox in the past and has completed residential issues treatment.  Chart review patient was treated at [facility] treatment facility and had been attending [facility].  He denies that he takes any medications for mental health.  Does report struggling with depression and anxiety.  He is denying suicidal or homicidal ideations.  Denies auditory visual hallucinations.  [name] denied that he is currently employed.  Reports paranoia. "  I think everyone is out to get me."  Patient was recently hospitalized however denied that he is followed up with outpatient services. Patient has been admitted to [facility] in March and April/ 2024.   During evaluation Calvin [name] is sitting: he is alert/oriented x 4; calm/cooperative; and mood congruent with affect.  Patient is speaking in a clear tone at moderate volume, and normal pace; with good eye contact.  His thought process is coherent and relevant; There is no indication that he is currently responding to internal/external stimuli or experiencing delusional thought content.  Patient denies suicidal/self-harm/homicidal ideation or psychosis.  Patient has remained calm throughout assessment and has answered questions appropriately.   Total Time spent with patient: 15 minutes  Musculoskeletal  Strength & Muscle  Tone: within normal limits Gait & Station: normal Patient leans: N/A  Psychiatric Specialty Exam  Presentation General Appearance:  Appropriate for Environment  Eye Contact: Good  Speech: Clear and Coherent  Speech Volume: Normal  Handedness: Right   Mood and Affect  Mood: Anxious  Affect: Appropriate; Congruent   Thought Process  Thought Processes: Coherent  Descriptions of Associations:Intact  Orientation:Full (Time, Place and Person)  Thought Content:Logical  Diagnosis of Schizophrenia or Schizoaffective disorder in past: No   Hallucinations:Hallucinations: Other (comment)  Ideas of Reference:None  Suicidal Thoughts:Suicidal Thoughts: No  Homicidal Thoughts:Homicidal Thoughts: No   Sensorium  Memory: Immediate Fair; Remote Good  Judgment: Good  Insight: Good   Executive Functions  Concentration: Good  Attention Span: Good  Recall: Fair  Fund of Knowledge: Fair  Language: Good   Psychomotor Activity  Psychomotor Activity: Psychomotor Activity: Normal   Assets  Assets: Desire for Improvement   Sleep  Sleep: Sleep: Fair   Nutritional Assessment (For OBS and FBC admissions only) Has the patient had a weight loss or gain of 10 pounds or more in the last 3 months?: No Has the patient had a decrease in food intake/or appetite?: No Does the patient have dental problems?: No Does the patient have eating habits or behaviors that may be indicators of an eating disorder including binging or inducing vomiting?: No Has the patient recently lost weight without trying?: 0 Has the patient been eating poorly because of a decreased appetite?: 0 Malnutrition Screening Tool Score: 0    Physical Exam Vitals and nursing note reviewed.  Constitutional:      Appearance: Normal  appearance.  Cardiovascular:     Rate and Rhythm: Normal rate.  Neurological:     Mental Status: He is alert and oriented to person, place, and time.   Psychiatric:        Mood and Affect: Mood normal.        Behavior: Behavior normal.    Review of Systems  Psychiatric/Behavioral:  Positive for depression and substance abuse. The patient is nervous/anxious.   All other systems reviewed and are negative.   Blood pressure 108/63, pulse 74, temperature 97.7 F (36.5 C), temperature source Oral, resp. rate 18, SpO2 98 %. There is no height or weight on file to calculate BMI.  Past Psychiatric History: Substance induced mood disorder, depression and anxiety.   Is the patient at risk to self? Yes  Has the patient been a risk to self in the past 6 months? Yes .    Has the patient been a risk to self within the distant past? No   Is the patient a risk to others? No   Has the patient been a risk to others in the past 6 months? No   Has the patient been a risk to others within the distant past? No   Past Medical History: see HPI  Family History: See HPI  Social History: See HPI  Last Labs:  Admission on 01/23/2023, Discharged on 01/23/2023  Component Date Value Ref Range Status   Alcohol, Ethyl (B) 01/23/2023 11 (H)  <10 mg/dL Final   Comment: (NOTE) Lowest detectable limit for serum alcohol is 10 mg/dL.  For medical purposes only. Performed at [facility], [address], [place], [place] [address]    Opiates 01/23/2023 NONE DETECTED  NONE DETECTED Final   Cocaine 01/23/2023 POSITIVE (A)  NONE DETECTED Final   Benzodiazepines 01/23/2023 NONE DETECTED  NONE DETECTED Final   Amphetamines 01/23/2023 NONE DETECTED  NONE DETECTED Final   Tetrahydrocannabinol 01/23/2023 POSITIVE (A)  NONE DETECTED Final   Barbiturates 01/23/2023 NONE DETECTED  NONE DETECTED Final   Comment: (NOTE) DRUG SCREEN FOR MEDICAL PURPOSES ONLY.  IF CONFIRMATION IS NEEDED FOR ANY PURPOSE, NOTIFY LAB WITHIN 5 DAYS.  LOWEST DETECTABLE LIMITS FOR URINE DRUG SCREEN Drug Class                     Cutoff (ng/mL) Amphetamine and metabolites     1000 Barbiturate and metabolites    200 Benzodiazepine                 200 Opiates and metabolites        300 Cocaine and metabolites        300 THC                            50 Performed at [facility], [address], [place], [place] [address]   Admission on 01/23/2023, Discharged on 01/23/2023  Component Date Value Ref Range Status   Lipase 01/23/2023 36  11 - 51 U/L Final   Performed at [facility], [address], [place], [place] [address]   Sodium 01/23/2023 138  135 - 145 mmol/L Final   Potassium 01/23/2023 3.7  3.5 - 5.1 mmol/L Final   Chloride 01/23/2023 103  98 - 111 mmol/L Final   CO2 01/23/2023 27  22 - 32 mmol/L Final   Glucose, Bld 01/23/2023 88  70 - 99 mg/dL Final   Glucose reference  range applies only to samples taken after fasting for at least 8 hours.   BUN 01/23/2023 10  6 - 20 mg/dL Final   Creatinine, Ser 01/23/2023 1.08  0.61 - 1.24 mg/dL Final   Calcium 40/98/1191 9.0  8.9 - 10.3 mg/dL Final   Total Protein 47/82/9562 7.1  6.5 - 8.1 g/dL Final   Albumin 13/02/6577 3.9  3.5 - 5.0 g/dL Final   AST 46/96/2952 35  15 - 41 U/L Final   ALT 01/23/2023 31  0 - 44 U/L Final   Alkaline Phosphatase 01/23/2023 56  38 - 126 U/L Final   Total Bilirubin 01/23/2023 0.5  0.3 - 1.2 mg/dL Final   GFR, Estimated 01/23/2023 >60  >60 mL/min Final   Comment: (NOTE) Calculated using the CKD-EPI Creatinine Equation (2021)    Anion gap 01/23/2023 8  5 - 15 Final   Performed at [facility], [address] [place], [place] [address]   WBC 01/23/2023 7.2  4.0 - 10.5 K/uL Final   RBC 01/23/2023 4.02 (L)  4.22 - 5.81 MIL/uL Final   Hemoglobin 01/23/2023 13.5  13.0 - 17.0 g/dL Final   HCT 44/07/270 41.6  39.0 - 52.0 % Final   MCV 01/23/2023 103.5 (H)  80.0 - 100.0 fL Final   MCH 01/23/2023 33.6  26.0 - 34.0 pg Final   MCHC 01/23/2023 32.5  30.0 - 36.0 g/dL Final   RDW 53/66/4403 12.5  11.5 - 15.5 % Final   Platelets 01/23/2023 226  150 - 400 K/uL Final    nRBC 01/23/2023 0.0  0.0 - 0.2 % Final   Performed at [facility], [address], [place], [place] [address]   Color, Urine 01/23/2023 YELLOW  YELLOW Final   APPearance 01/23/2023 CLEAR  CLEAR Final   Specific Gravity, Urine 01/23/2023 >1.030 (H)  1.005 - 1.030 Final   pH 01/23/2023 5.5  5.0 - 8.0 Final   Glucose, UA 01/23/2023 NEGATIVE  NEGATIVE mg/dL Final   Hgb urine dipstick 01/23/2023 TRACE (A)  NEGATIVE Final   Bilirubin Urine 01/23/2023 NEGATIVE  NEGATIVE Final   Ketones, ur 01/23/2023 NEGATIVE  NEGATIVE mg/dL Final   Protein, ur 95/63/8756 NEGATIVE  NEGATIVE mg/dL Final   Nitrite 43/32/9518 NEGATIVE  NEGATIVE Final   Leukocytes,Ua 01/23/2023 NEGATIVE  NEGATIVE Final   Performed at [facility], [address] [place], [place] [address]   RBC / HPF 01/23/2023 0-5  0 - 5 RBC/hpf Final   WBC, UA 01/23/2023 6-10  0 - 5 WBC/hpf Final   Bacteria, UA 01/23/2023 NONE SEEN  NONE SEEN Final   Squamous Epithelial / HPF 01/23/2023 0-5  0 - 5 /HPF Final   Mucus 01/23/2023 PRESENT   Final   Performed at [facility], [address], [place], [place] [address]  Admission on 10/30/2022, Discharged on 10/31/2022  Component Date Value Ref Range Status   SARS Coronavirus 2 by RT PCR 10/30/2022 NEGATIVE  NEGATIVE Final   Performed at [facility], [address] [place], [place] [address]   SARSCOV2ONAVIRUS 2 AG 10/30/2022 NEGATIVE  NEGATIVE Final   Comment: (NOTE) SARS-CoV-2 antigen NOT DETECTED.   Negative results are presumptive.  Negative results do not preclude SARS-CoV-2 infection and should not be used as the sole basis for treatment or other patient management decisions, including infection  control decisions, particularly in the presence of clinical signs and  symptoms consistent with COVID-19, or in those who have been  in contact with the virus.  Negative results must be combined with clinical observations, patient history, and epidemiological information.  The expected result is Negative.  Fact Sheet for Patients: https://www.jennings-kim.com/  Fact Sheet for Healthcare Providers: https://alexander-rogers.biz/  This test is not yet approved or cleared by the [place] FDA and  has been authorized for detection and/or diagnosis of SARS-CoV-2 by FDA under an Emergency Use Authorization (EUA).  This EUA will remain in effect (meaning this test can be used) for the duration of  the COV                          ID-19 declaration under Section 564(b)(1) of the Act, 21 U.S.C. section 360bbb-3(b)(1), unless the authorization is terminated or revoked sooner.    Admission on 10/26/2022, Discharged on 10/26/2022  Component Date Value Ref Range Status   Sodium 10/26/2022 137  135 - 145 mmol/L Final   Potassium 10/26/2022 3.6  3.5 - 5.1 mmol/L Final   Chloride 10/26/2022 102  98 - 111 mmol/L Final   CO2 10/26/2022 24  22 - 32 mmol/L Final   Glucose, Bld 10/26/2022 79  70 - 99 mg/dL Final   Glucose reference range applies only to samples taken after fasting for at least 8 hours.   BUN 10/26/2022 10  6 - 20 mg/dL Final   Creatinine, Ser 10/26/2022 1.08  0.61 - 1.24 mg/dL Final   Calcium 16/04/9603 8.7 (L)  8.9 - 10.3 mg/dL Final   GFR, Estimated 10/26/2022 >60  >60 mL/min Final   Comment: (NOTE) Calculated using the CKD-EPI Creatinine Equation (2021)    Anion gap 10/26/2022 11  5 - 15 Final   Performed at [facility], [address] [place], [place] [address]   WBC 10/26/2022 6.5  4.0 - 10.5 K/uL Final   RBC 10/26/2022 3.91 (L)  4.22 - 5.81 MIL/uL Final   Hemoglobin 10/26/2022 13.6  13.0 - 17.0 g/dL Final   HCT 11/91/4782 39.7  39.0 - 52.0 % Final   MCV 10/26/2022 101.5 (H)  80.0 - 100.0 fL Final   MCH 10/26/2022 34.8 (H)  26.0 - 34.0 pg Final   MCHC 10/26/2022 34.3  30.0 - 36.0 g/dL Final   RDW 95/62/1308 12.6  11.5 - 15.5 % Final   Platelets 10/26/2022 204  150 - 400 K/uL Final   nRBC 10/26/2022 0.0   0.0 - 0.2 % Final   Neutrophils Relative % 10/26/2022 70  % Final   Neutro Abs 10/26/2022 4.5  1.7 - 7.7 K/uL Final   Lymphocytes Relative 10/26/2022 18  % Final   Lymphs Abs 10/26/2022 1.2  0.7 - 4.0 K/uL Final   Monocytes Relative 10/26/2022 9  % Final   Monocytes Absolute 10/26/2022 0.6  0.1 - 1.0 K/uL Final   Eosinophils Relative 10/26/2022 3  % Final   Eosinophils Absolute 10/26/2022 0.2  0.0 - 0.5 K/uL Final   Basophils Relative 10/26/2022 0  % Final   Basophils Absolute 10/26/2022 0.0  0.0 - 0.1 K/uL Final   Immature Granulocytes 10/26/2022 0  % Final   Abs Immature Granulocytes 10/26/2022 0.02  0.00 - 0.07 K/uL Final   Performed at [facility], [address] [place], [place] [address]   Group A Strep by PCR 10/26/2022 DETECTED (A)  NOT DETECTED Final   Performed at [facility], [address] [place], [place] [address]   SARS Coronavirus 2 by  RT PCR 10/26/2022 NEGATIVE  NEGATIVE Final   Influenza A by PCR 10/26/2022 NEGATIVE  NEGATIVE Final   Influenza B by PCR 10/26/2022 NEGATIVE  NEGATIVE Final   Comment: (NOTE) The Xpert Xpress SARS-CoV-2/FLU/RSV plus assay is intended as an aid in the diagnosis of influenza from Nasopharyngeal swab specimens and should not be used as a sole basis for treatment. Nasal washings and aspirates are unacceptable for Xpert Xpress SARS-CoV-2/FLU/RSV testing.  Fact Sheet for Patients: BloggerCourse.com  Fact Sheet for Healthcare Providers: SeriousBroker.it  This test is not yet approved or cleared by the [place] FDA and has been authorized for detection and/or diagnosis of SARS-CoV-2 by FDA under an Emergency Use Authorization (EUA). This EUA will remain in effect (meaning this test can be used) for the duration of the COVID-19 declaration under Section 564(b)(1) of the Act, 21 U.S.C. section 360bbb-3(b)(1), unless the authorization is terminated or revoked.     Resp  Syncytial Virus by PCR 10/26/2022 NEGATIVE  NEGATIVE Final   Comment: (NOTE) Fact Sheet for Patients: BloggerCourse.com  Fact Sheet for Healthcare Providers: SeriousBroker.it  This test is not yet approved or cleared by the [place] FDA and has been authorized for detection and/or diagnosis of SARS-CoV-2 by FDA under an Emergency Use Authorization (EUA). This EUA will remain in effect (meaning this test can be used) for the duration of the COVID-19 declaration under Section 564(b)(1) of the Act, 21 U.S.C. section 360bbb-3(b)(1), unless the authorization is terminated or revoked.  Performed at [facility], [address] [place], [place] [address]   Admission on 10/22/2022, Discharged on 10/22/2022  Component Date Value Ref Range Status   Lipase 10/22/2022 26  11 - 51 U/L Final   Performed at [facility], [address], [place], [place] [address]   Sodium 10/22/2022 141  135 - 145 mmol/L Final   Potassium 10/22/2022 3.7  3.5 - 5.1 mmol/L Final   Chloride 10/22/2022 105  98 - 111 mmol/L Final   CO2 10/22/2022 20 (L)  22 - 32 mmol/L Final   Glucose, Bld 10/22/2022 68 (L)  70 - 99 mg/dL Final   Glucose reference range applies only to samples taken after fasting for at least 8 hours.   BUN 10/22/2022 16  6 - 20 mg/dL Final   Creatinine, Ser 10/22/2022 1.15  0.61 - 1.24 mg/dL Final   Calcium 09/81/1914 8.8 (L)  8.9 - 10.3 mg/dL Final   Total Protein 78/29/5621 7.4  6.5 - 8.1 g/dL Final   Albumin 30/86/5784 4.2  3.5 - 5.0 g/dL Final   AST 69/62/9528 51 (H)  15 - 41 U/L Final   ALT 10/22/2022 44  0 - 44 U/L Final   Alkaline Phosphatase 10/22/2022 72  38 - 126 U/L Final   Total Bilirubin 10/22/2022 0.3  0.3 - 1.2 mg/dL Final   GFR, Estimated 10/22/2022 >60  >60 mL/min Final   Comment: (NOTE) Calculated using the CKD-EPI Creatinine Equation (2021)    Anion gap 10/22/2022 16 (H)  5 - 15 Final   Performed at [facility], [address] [place], [place] [address]   WBC 10/22/2022 8.0  4.0 - 10.5 K/uL Final   RBC 10/22/2022 3.94 (L)  4.22 - 5.81 MIL/uL Final   Hemoglobin 10/22/2022 13.9  13.0 - 17.0 g/dL Final   HCT 40/04/2724 41.5  39.0 - 52.0 % Final   MCV 10/22/2022 105.3 (H)  80.0 - 100.0 fL Final   MCH  10/22/2022 35.3 (H)  26.0 - 34.0 pg Final   MCHC 10/22/2022 33.5  30.0 - 36.0 g/dL Final   RDW 16/04/9603 13.1  11.5 - 15.5 % Final   Platelets 10/22/2022 235  150 - 400 K/uL Final   nRBC 10/22/2022 0.0  0.0 - 0.2 % Final   Performed at [facility], [address], [place], [place] [address]   Color, Urine 10/22/2022 YELLOW  YELLOW Final   APPearance 10/22/2022 CLEAR  CLEAR Final   Specific Gravity, Urine 10/22/2022 1.025  1.005 - 1.030 Final   pH 10/22/2022 5.0  5.0 - 8.0 Final   Glucose, UA 10/22/2022 NEGATIVE  NEGATIVE mg/dL Final   Hgb urine dipstick 10/22/2022 MODERATE (A)  NEGATIVE Final   Bilirubin Urine 10/22/2022 NEGATIVE  NEGATIVE Final   Ketones, ur 10/22/2022 20 (A)  NEGATIVE mg/dL Final   Protein, ur 11/91/4782 NEGATIVE  NEGATIVE mg/dL Final   Nitrite 95/62/1308 NEGATIVE  NEGATIVE Final   Leukocytes,Ua 10/22/2022 SMALL (A)  NEGATIVE Final   RBC / HPF 10/22/2022 0-5  0 - 5 RBC/hpf Final   WBC, UA 10/22/2022 21-50  0 - 5 WBC/hpf Final   Bacteria, UA 10/22/2022 FEW (A)  NONE SEEN Final   Squamous Epithelial / HPF 10/22/2022 0-5  0 - 5 /HPF Final   Mucus 10/22/2022 PRESENT   Final   Performed at [facility], [address] [place], [place] [address]   Opiates 10/22/2022 NONE DETECTED  NONE DETECTED Final   Cocaine 10/22/2022 POSITIVE (A)  NONE DETECTED Final   Benzodiazepines 10/22/2022 NONE DETECTED  NONE DETECTED Final   Amphetamines 10/22/2022 NONE DETECTED  NONE DETECTED Final   Tetrahydrocannabinol 10/22/2022 POSITIVE (A)  NONE DETECTED Final   Barbiturates 10/22/2022 NONE DETECTED  NONE DETECTED Final   Comment: (NOTE) DRUG SCREEN FOR MEDICAL  PURPOSES ONLY.  IF CONFIRMATION IS NEEDED FOR ANY PURPOSE, NOTIFY LAB WITHIN 5 DAYS.  LOWEST DETECTABLE LIMITS FOR URINE DRUG SCREEN Drug Class                     Cutoff (ng/mL) Amphetamine and metabolites    1000 Barbiturate and metabolites    200 Benzodiazepine                 200 Opiates and metabolites        300 Cocaine and metabolites        300 THC                            50 Performed at [facility], [address], [place], [place] [address]    Glucose-Capillary 10/22/2022 57 (L)  70 - 99 mg/dL Final   Glucose reference range applies only to samples taken after fasting for at least 8 hours.   Glucose-Capillary 10/22/2022 83  70 - 99 mg/dL Final   Glucose reference range applies only to samples taken after fasting for at least 8 hours.   Comment 1 10/22/2022 Document in Chart   Final  Admission on 10/17/2022, Discharged on 10/17/2022  Component Date Value Ref Range Status   WBC 10/17/2022 6.9  4.0 - 10.5 K/uL Final   RBC 10/17/2022 3.79 (L)  4.22 - 5.81 MIL/uL Final   Hemoglobin 10/17/2022 13.1  13.0 - 17.0 g/dL Final   HCT 52/84/1324 39.5  39.0 - 52.0 % Final   MCV 10/17/2022 104.2 (H)  80.0 - 100.0 fL Final  MCH 10/17/2022 34.6 (H)  26.0 - 34.0 pg Final   MCHC 10/17/2022 33.2  30.0 - 36.0 g/dL Final   RDW 16/04/9603 13.1  11.5 - 15.5 % Final   Platelets 10/17/2022 209  150 - 400 K/uL Final   nRBC 10/17/2022 0.0  0.0 - 0.2 % Final   Neutrophils Relative % 10/17/2022 62  % Final   Neutro Abs 10/17/2022 4.2  1.7 - 7.7 K/uL Final   Lymphocytes Relative 10/17/2022 28  % Final   Lymphs Abs 10/17/2022 1.9  0.7 - 4.0 K/uL Final   Monocytes Relative 10/17/2022 8  % Final   Monocytes Absolute 10/17/2022 0.6  0.1 - 1.0 K/uL Final   Eosinophils Relative 10/17/2022 2  % Final   Eosinophils Absolute 10/17/2022 0.1  0.0 - 0.5 K/uL Final   Basophils Relative 10/17/2022 0  % Final   Basophils Absolute 10/17/2022 0.0  0.0 - 0.1 K/uL Final   Immature Granulocytes  10/17/2022 0  % Final   Abs Immature Granulocytes 10/17/2022 0.01  0.00 - 0.07 K/uL Final   Performed at [facility], [address], [place], [place] [address]   Sodium 10/17/2022 136  135 - 145 mmol/L Final   Potassium 10/17/2022 3.5  3.5 - 5.1 mmol/L Final   Chloride 10/17/2022 105  98 - 111 mmol/L Final   CO2 10/17/2022 23  22 - 32 mmol/L Final   Glucose, Bld 10/17/2022 81  70 - 99 mg/dL Final   Glucose reference range applies only to samples taken after fasting for at least 8 hours.   BUN 10/17/2022 9  6 - 20 mg/dL Final   Creatinine, Ser 10/17/2022 0.88  0.61 - 1.24 mg/dL Final   Calcium 11/91/4782 8.6 (L)  8.9 - 10.3 mg/dL Final   Total Protein 95/62/1308 7.2  6.5 - 8.1 g/dL Final   Albumin 65/78/4696 4.3  3.5 - 5.0 g/dL Final   AST 29/52/8413 45 (H)  15 - 41 U/L Final   ALT 10/17/2022 70 (H)  0 - 44 U/L Final   Alkaline Phosphatase 10/17/2022 68  38 - 126 U/L Final   Total Bilirubin 10/17/2022 1.3 (H)  0.3 - 1.2 mg/dL Final   GFR, Estimated 10/17/2022 >60  >60 mL/min Final   Comment: (NOTE) Calculated using the CKD-EPI Creatinine Equation (2021)    Anion gap 10/17/2022 8  5 - 15 Final   Performed at [facility], [address] [place], [place] [address]   Lipase 10/17/2022 27  11 - 51 U/L Final   Performed at [facility], [address] [place], [place] [address]   Lactic Acid, Venous 10/17/2022 0.8  0.5 - 1.9 mmol/L Final   Performed at [facility], [address], [place], [place] [address]   Alcohol, Ethyl (B) 10/17/2022 <10  <10 mg/dL Final   Comment: (NOTE) Lowest detectable limit for serum alcohol is 10 mg/dL.  For medical purposes only. Performed at [facility], [address], [place], [place] [address]   Admission on 09/22/2022, Discharged on 09/22/2022  Component Date Value Ref Range Status   WBC 09/22/2022 10.1  4.0 - 10.5 K/uL Final   RBC 09/22/2022 3.89 (L)   4.22 - 5.81 MIL/uL Final   Hemoglobin 09/22/2022 13.6  13.0 - 17.0 g/dL Final   HCT 25/95/6387 39.9  39.0 - 52.0 % Final   MCV 09/22/2022 102.6 (H)  80.0 - 100.0 fL Final   MCH 09/22/2022 35.0 (  H)  26.0 - 34.0 pg Final   MCHC 09/22/2022 34.1  30.0 - 36.0 g/dL Final   RDW 27/25/3664 12.2  11.5 - 15.5 % Final   Platelets 09/22/2022 186  150 - 400 K/uL Final   nRBC 09/22/2022 0.0  0.0 - 0.2 % Final   Neutrophils Relative % 09/22/2022 83  % Final   Neutro Abs 09/22/2022 8.3 (H)  1.7 - 7.7 K/uL Final   Lymphocytes Relative 09/22/2022 12  % Final   Lymphs Abs 09/22/2022 1.2  0.7 - 4.0 K/uL Final   Monocytes Relative 09/22/2022 5  % Final   Monocytes Absolute 09/22/2022 0.5  0.1 - 1.0 K/uL Final   Eosinophils Relative 09/22/2022 0  % Final   Eosinophils Absolute 09/22/2022 0.0  0.0 - 0.5 K/uL Final   Basophils Relative 09/22/2022 0  % Final   Basophils Absolute 09/22/2022 0.0  0.0 - 0.1 K/uL Final   Immature Granulocytes 09/22/2022 0  % Final   Abs Immature Granulocytes 09/22/2022 0.04  0.00 - 0.07 K/uL Final   Performed at [facility], [address] [place], [place] [address]   Sodium 09/22/2022 135  135 - 145 mmol/L Final   Potassium 09/22/2022 4.2  3.5 - 5.1 mmol/L Final   Chloride 09/22/2022 98  98 - 111 mmol/L Final   CO2 09/22/2022 22  22 - 32 mmol/L Final   Glucose, Bld 09/22/2022 155 (H)  70 - 99 mg/dL Final   Glucose reference range applies only to samples taken after fasting for at least 8 hours.   BUN 09/22/2022 16  6 - 20 mg/dL Final   Creatinine, Ser 09/22/2022 1.23  0.61 - 1.24 mg/dL Final   Calcium 42/59/5638 8.5 (L)  8.9 - 10.3 mg/dL Final   Total Protein 75/64/3329 7.0  6.5 - 8.1 g/dL Final   Albumin 51/88/4166 4.1  3.5 - 5.0 g/dL Final   AST 01/18/1600 39  15 - 41 U/L Final   ALT 09/22/2022 20  0 - 44 U/L Final   Alkaline Phosphatase 09/22/2022 66  38 - 126 U/L Final   Total Bilirubin 09/22/2022 0.6  0.3 - 1.2 mg/dL Final   GFR, Estimated 09/22/2022 >60  >60  mL/min Final   Comment: (NOTE) Calculated using the CKD-EPI Creatinine Equation (2021)    Anion gap 09/22/2022 15  5 - 15 Final   Performed at [facility], [address], [place], [place] [address]   Alcohol, Ethyl (B) 09/22/2022 69 (H)  <10 mg/dL Final   Comment: (NOTE) Lowest detectable limit for serum alcohol is 10 mg/dL.  For medical purposes only. Performed at [facility], [address] [place], [place] [address]    Opiates 09/22/2022 NONE DETECTED  NONE DETECTED Final   Cocaine 09/22/2022 POSITIVE (A)  NONE DETECTED Final   Benzodiazepines 09/22/2022 NONE DETECTED  NONE DETECTED Final   Amphetamines 09/22/2022 POSITIVE (A)  NONE DETECTED Final   Tetrahydrocannabinol 09/22/2022 POSITIVE (A)  NONE DETECTED Final   Barbiturates 09/22/2022 NONE DETECTED  NONE DETECTED Final   Comment: (NOTE) DRUG SCREEN FOR MEDICAL PURPOSES ONLY.  IF CONFIRMATION IS NEEDED FOR ANY PURPOSE, NOTIFY LAB WITHIN 5 DAYS.  LOWEST DETECTABLE LIMITS FOR URINE DRUG SCREEN Drug Class                     Cutoff (ng/mL) Amphetamine and metabolites    1000 Barbiturate and metabolites    200 Benzodiazepine  200 Opiates and metabolites        300 Cocaine and metabolites        300 THC                            50 Performed at [facility], [address], [place], [place] [address]    Acetaminophen (Tylenol), Serum 09/22/2022 <10 (L)  10 - 30 ug/mL Final   Comment: (NOTE) Therapeutic concentrations vary significantly. A range of 10-30 ug/mL  may be an effective concentration for many patients. However, some  are best treated at concentrations outside of this range. Acetaminophen concentrations >150 ug/mL at 4 hours after ingestion  and >50 ug/mL at 12 hours after ingestion are often associated with  toxic reactions.  Performed at [facility], [address], [place], [place] [address]    Salicylate Lvl 09/22/2022 <7.0 (L)  7.0 - 30.0 mg/dL Final    Performed at [facility], [address] [place], [place] [address]   SARS Coronavirus 2 by RT PCR 09/22/2022 NEGATIVE  NEGATIVE Final   Influenza A by PCR 09/22/2022 NEGATIVE  NEGATIVE Final   Influenza B by PCR 09/22/2022 NEGATIVE  NEGATIVE Final   Comment: (NOTE) The Xpert Xpress SARS-CoV-2/FLU/RSV plus assay is intended as an aid in the diagnosis of influenza from Nasopharyngeal swab specimens and should not be used as a sole basis for treatment. Nasal washings and aspirates are unacceptable for Xpert Xpress SARS-CoV-2/FLU/RSV testing.  Fact Sheet for Patients: BloggerCourse.com  Fact Sheet for Healthcare Providers: SeriousBroker.it  This test is not yet approved or cleared by the [place] FDA and has been authorized for detection and/or diagnosis of SARS-CoV-2 by FDA under an Emergency Use Authorization (EUA). This EUA will remain in effect (meaning this test can be used) for the duration of the COVID-19 declaration under Section 564(b)(1) of the Act, 21 U.S.C. section 360bbb-3(b)(1), unless the authorization is terminated or revoked.     Resp Syncytial Virus by PCR 09/22/2022 NEGATIVE  NEGATIVE Final   Comment: (NOTE) Fact Sheet for Patients: BloggerCourse.com  Fact Sheet for Healthcare Providers: SeriousBroker.it  This test is not yet approved or cleared by the [place] FDA and has been authorized for detection and/or diagnosis of SARS-CoV-2 by FDA under an Emergency Use Authorization (EUA). This EUA will remain in effect (meaning this test can be used) for the duration of the COVID-19 declaration under Section 564(b)(1) of the Act, 21 U.S.C. section 360bbb-3(b)(1), unless the authorization is terminated or revoked.  Performed at [facility], [address] [place], [place] [address]   Admission on 09/18/2022, Discharged on 09/20/2022   Component Date Value Ref Range Status   SARS Coronavirus 2 by RT PCR 09/18/2022 NEGATIVE  NEGATIVE Final   Influenza A by PCR 09/18/2022 NEGATIVE  NEGATIVE Final   Influenza B by PCR 09/18/2022 NEGATIVE  NEGATIVE Final   Comment: (NOTE) The Xpert Xpress SARS-CoV-2/FLU/RSV plus assay is intended as an aid in the diagnosis of influenza from Nasopharyngeal swab specimens and should not be used as a sole basis for treatment. Nasal washings and aspirates are unacceptable for Xpert Xpress SARS-CoV-2/FLU/RSV testing.  Fact Sheet for Patients: BloggerCourse.com  Fact Sheet for Healthcare Providers: SeriousBroker.it  This test is not yet approved or cleared by the [place] FDA and has been authorized for detection and/or diagnosis of SARS-CoV-2 by FDA under an Emergency Use Authorization (EUA). This  EUA will remain in effect (meaning this test can be used) for the duration of the COVID-19 declaration under Section 564(b)(1) of the Act, 21 U.S.C. section 360bbb-3(b)(1), unless the authorization is terminated or revoked.     Resp Syncytial Virus by PCR 09/18/2022 NEGATIVE  NEGATIVE Final   Comment: (NOTE) Fact Sheet for Patients: BloggerCourse.com  Fact Sheet for Healthcare Providers: SeriousBroker.it  This test is not yet approved or cleared by the [place] FDA and has been authorized for detection and/or diagnosis of SARS-CoV-2 by FDA under an Emergency Use Authorization (EUA). This EUA will remain in effect (meaning this test can be used) for the duration of the COVID-19 declaration under Section 564(b)(1) of the Act, 21 U.S.C. section 360bbb-3(b)(1), unless the authorization is terminated or revoked.  Performed at [facility], [address], [place], [place] [address]    WBC 09/18/2022 6.7  4.0 - 10.5 K/uL Final   RBC 09/18/2022 4.42  4.22 - 5.81 MIL/uL Final    Hemoglobin 09/18/2022 15.4  13.0 - 17.0 g/dL Final   HCT 57/84/6962 44.6  39.0 - 52.0 % Final   MCV 09/18/2022 100.9 (H)  80.0 - 100.0 fL Final   MCH 09/18/2022 34.8 (H)  26.0 - 34.0 pg Final   MCHC 09/18/2022 34.5  30.0 - 36.0 g/dL Final   RDW 95/28/4132 11.9  11.5 - 15.5 % Final   Platelets 09/18/2022 219  150 - 400 K/uL Final   nRBC 09/18/2022 0.0  0.0 - 0.2 % Final   Neutrophils Relative % 09/18/2022 53  % Final   Neutro Abs 09/18/2022 3.5  1.7 - 7.7 K/uL Final   Lymphocytes Relative 09/18/2022 39  % Final   Lymphs Abs 09/18/2022 2.6  0.7 - 4.0 K/uL Final   Monocytes Relative 09/18/2022 7  % Final   Monocytes Absolute 09/18/2022 0.5  0.1 - 1.0 K/uL Final   Eosinophils Relative 09/18/2022 1  % Final   Eosinophils Absolute 09/18/2022 0.1  0.0 - 0.5 K/uL Final   Basophils Relative 09/18/2022 0  % Final   Basophils Absolute 09/18/2022 0.0  0.0 - 0.1 K/uL Final   Immature Granulocytes 09/18/2022 0  % Final   Abs Immature Granulocytes 09/18/2022 0.01  0.00 - 0.07 K/uL Final   Performed at [facility], [address], [place], [place] [address]   Sodium 09/18/2022 139  135 - 145 mmol/L Final   Potassium 09/18/2022 4.0  3.5 - 5.1 mmol/L Final   Chloride 09/18/2022 99  98 - 111 mmol/L Final   CO2 09/18/2022 27  22 - 32 mmol/L Final   Glucose, Bld 09/18/2022 62 (L)  70 - 99 mg/dL Final   Glucose reference range applies only to samples taken after fasting for at least 8 hours.   BUN 09/18/2022 9  6 - 20 mg/dL Final   Creatinine, Ser 09/18/2022 0.97  0.61 - 1.24 mg/dL Final   Calcium 27/25/3664 9.4  8.9 - 10.3 mg/dL Final   Total Protein 40/34/7425 8.2 (H)  6.5 - 8.1 g/dL Final   Albumin 95/63/8756 4.4  3.5 - 5.0 g/dL Final   AST 43/32/9518 34  15 - 41 U/L Final   ALT 09/18/2022 23  0 - 44 U/L Final   Alkaline Phosphatase 09/18/2022 67  38 - 126 U/L Final   Total Bilirubin 09/18/2022 0.6  0.3 - 1.2 mg/dL Final   GFR, Estimated 09/18/2022 >60  >60 mL/min Final   Comment:  (NOTE) Calculated using the  CKD-EPI Creatinine Equation (2021)    Anion gap 09/18/2022 13  5 - 15 Final   Performed at [facility], [address], [place], [place] [address]   Hgb A1c MFr Bld 09/18/2022 4.9  4.8 - 5.6 % Final   Comment: (NOTE)         Prediabetes: 5.7 - 6.4         Diabetes: >6.4         Glycemic control for adults with diabetes: <7.0    Mean Plasma Glucose 09/18/2022 94  mg/dL Final   Comment: (NOTE) Performed At: [facility] [address] [place], [place] [id number] Jolene Schimke MD [id number]    Cholesterol 09/18/2022 135  0 - 200 mg/dL Final   Triglycerides 30/86/5784 72  <150 mg/dL Final   HDL 69/62/9528 72  >40 mg/dL Final   Total CHOL/HDL Ratio 09/18/2022 1.9  RATIO Final   VLDL 09/18/2022 14  0 - 40 mg/dL Final   LDL Cholesterol 09/18/2022 49  0 - 99 mg/dL Final   Comment:        Total Cholesterol/HDL:CHD Risk Coronary Heart Disease Risk Table                     Men   Women  1/2 Average Risk   3.4   3.3  Average Risk       5.0   4.4  2 X Average Risk   9.6   7.1  3 X Average Risk  23.4   11.0        Use the calculated Patient Ratio above and the CHD Risk Table to determine the patient's CHD Risk.        ATP III CLASSIFICATION (LDL):  <100     mg/dL   Optimal  413-244  mg/dL   Near or Above                    Optimal  130-159  mg/dL   Borderline  010-272  mg/dL   High  >536     mg/dL   Very High Performed at [facility], [address], [place], [place] [address]    TSH 09/18/2022 1.267  0.350 - 4.500 uIU/mL Final   Comment: Performed by a 3rd Generation assay with a functional sensitivity of <=0.01 uIU/mL. Performed at [facility], [address], [place], [place] [address]    POC Amphetamine UR 09/18/2022 None Detected  NONE DETECTED (Cut Off Level 1000 ng/mL) Preliminary   POC Secobarbital (BAR) 09/18/2022 None Detected  NONE DETECTED (Cut Off Level 300 ng/mL) Preliminary   POC Buprenorphine (BUP)  09/18/2022 None Detected  NONE DETECTED (Cut Off Level 10 ng/mL) Preliminary   POC Oxazepam (BZO) 09/18/2022 None Detected  NONE DETECTED (Cut Off Level 300 ng/mL) Preliminary   POC Cocaine UR 09/18/2022 None Detected  NONE DETECTED (Cut Off Level 300 ng/mL) Preliminary   POC Methamphetamine UR 09/18/2022 Positive (A)  NONE DETECTED (Cut Off Level 1000 ng/mL) Preliminary   POC Morphine 09/18/2022 None Detected  NONE DETECTED (Cut Off Level 300 ng/mL) Preliminary   POC Methadone UR 09/18/2022 None Detected  NONE DETECTED (Cut Off Level 300 ng/mL) Preliminary   POC Oxycodone UR 09/18/2022 Positive (A)  NONE DETECTED (Cut Off Level 100 ng/mL) Preliminary   POC Marijuana UR 09/18/2022 Positive (A)  NONE DETECTED (Cut Off Level 50 ng/mL) Preliminary   SARSCOV2ONAVIRUS 2 AG 09/18/2022 NEGATIVE  NEGATIVE Final  Comment: (NOTE) SARS-CoV-2 antigen NOT DETECTED.   Negative results are presumptive.  Negative results do not preclude SARS-CoV-2 infection and should not be used as the sole basis for treatment or other patient management decisions, including infection  control decisions, particularly in the presence of clinical signs and  symptoms consistent with COVID-19, or in those who have been in contact with the virus.  Negative results must be combined with clinical observations, patient history, and epidemiological information. The expected result is Negative.  Fact Sheet for Patients: https://www.jennings-kim.com/  Fact Sheet for Healthcare Providers: https://alexander-rogers.biz/  This test is not yet approved or cleared by the [place] FDA and  has been authorized for detection and/or diagnosis of SARS-CoV-2 by FDA under an Emergency Use Authorization (EUA).  This EUA will remain in effect (meaning this test can be used) for the duration of  the COV                          ID-19 declaration under Section 564(b)(1) of the Act, 21 U.S.C. section  360bbb-3(b)(1), unless the authorization is terminated or revoked sooner.    Admission on 08/05/2022, Discharged on 08/06/2022  Component Date Value Ref Range Status   Lipase 08/05/2022 49  11 - 51 U/L Final   Performed at [facility], [address], [place], [place] [address]   Sodium 08/05/2022 136  135 - 145 mmol/L Final   Potassium 08/05/2022 3.9  3.5 - 5.1 mmol/L Final   Chloride 08/05/2022 102  98 - 111 mmol/L Final   CO2 08/05/2022 26  22 - 32 mmol/L Final   Glucose, Bld 08/05/2022 145 (H)  70 - 99 mg/dL Final   Glucose reference range applies only to samples taken after fasting for at least 8 hours.   BUN 08/05/2022 8  6 - 20 mg/dL Final   Creatinine, Ser 08/05/2022 1.01  0.61 - 1.24 mg/dL Final   Calcium 60/45/4098 9.0  8.9 - 10.3 mg/dL Final   Total Protein 11/91/4782 7.1  6.5 - 8.1 g/dL Final   Albumin 95/62/1308 3.9  3.5 - 5.0 g/dL Final   AST 65/78/4696 27  15 - 41 U/L Final   ALT 08/05/2022 15  0 - 44 U/L Final   Alkaline Phosphatase 08/05/2022 64  38 - 126 U/L Final   Total Bilirubin 08/05/2022 0.5  0.3 - 1.2 mg/dL Final   GFR, Estimated 08/05/2022 >60  >60 mL/min Final   Comment: (NOTE) Calculated using the CKD-EPI Creatinine Equation (2021)    Anion gap 08/05/2022 8  5 - 15 Final   Performed at [facility], [address], [place], [place] [address]   WBC 08/05/2022 6.7  4.0 - 10.5 K/uL Final   RBC 08/05/2022 4.25  4.22 - 5.81 MIL/uL Final   Hemoglobin 08/05/2022 14.7  13.0 - 17.0 g/dL Final   HCT 41/32/4401 44.2  39.0 - 52.0 % Final   MCV 08/05/2022 104.0 (H)  80.0 - 100.0 fL Final   MCH 08/05/2022 34.6 (H)  26.0 - 34.0 pg Final   MCHC 08/05/2022 33.3  30.0 - 36.0 g/dL Final   RDW 02/72/5366 11.9  11.5 - 15.5 % Final   Platelets 08/05/2022 209  150 - 400 K/uL Final   nRBC 08/05/2022 0.0  0.0 - 0.2 % Final   Performed at [facility], [address] [place], [place] [address]   Color, Urine 08/05/2022 YELLOW  YELLOW Final  APPearance  08/05/2022 CLEAR  CLEAR Final   Specific Gravity, Urine 08/05/2022 1.025  1.005 - 1.030 Final   pH 08/05/2022 6.0  5.0 - 8.0 Final   Glucose, UA 08/05/2022 50 (A)  NEGATIVE mg/dL Final   Hgb urine dipstick 08/05/2022 SMALL (A)  NEGATIVE Final   Bilirubin Urine 08/05/2022 NEGATIVE  NEGATIVE Final   Ketones, ur 08/05/2022 NEGATIVE  NEGATIVE mg/dL Final   Protein, ur 16/04/9603 30 (A)  NEGATIVE mg/dL Final   Nitrite 54/03/8118 NEGATIVE  NEGATIVE Final   Leukocytes,Ua 08/05/2022 SMALL (A)  NEGATIVE Final   RBC / HPF 08/05/2022 6-10  0 - 5 RBC/hpf Final   WBC, UA 08/05/2022 11-20  0 - 5 WBC/hpf Final   Bacteria, UA 08/05/2022 RARE (A)  NONE SEEN Final   Squamous Epithelial / HPF 08/05/2022 0-5  0 - 5 /HPF Final   Mucus 08/05/2022 PRESENT   Final   Hyaline Casts, UA 08/05/2022 PRESENT   Final   Performed at [facility], [address], [place], [place] [address]    Allergies: Tylenol [acetaminophen]  Medications:  Facility Ordered Medications  Medication   [COMPLETED] naproxen (NAPROSYN) tablet 500 mg   alum & mag hydroxide-simeth (MAALOX/MYLANTA) 200-200-20 MG/5ML suspension 30 mL   magnesium hydroxide (MILK OF MAGNESIA) suspension 30 mL   [START ON 01/26/2023] hydrOXYzine (ATARAX) tablet 25 mg   thiamine (VITAMIN B1) injection 100 mg   [START ON 01/24/2023] thiamine (VITAMIN B1) tablet 100 mg   multivitamin with minerals tablet 1 tablet   LORazepam (ATIVAN) tablet 1 mg   hydrOXYzine (ATARAX) tablet 25 mg   loperamide (IMODIUM) capsule 2-4 mg   ondansetron (ZOFRAN-ODT) disintegrating tablet 4 mg   PTA Medications  Medication Sig   Melatonin 10 MG TABS Take 10 mg by mouth at bedtime as needed (For sleep).   dicyclomine (BENTYL) 20 MG tablet Take 1 tablet (20 mg total) by mouth 2 (two) times daily.      Medical Decision Making   Patient as been accepted to [facility] treatment [facility]     Recommendations  Patient to be discharged by 8 am on 01/24/2023  Oneta Rack,  NP 01/23/23  9:58 AM

## 2023-01-23 NOTE — ED Notes (Signed)
Patient in milieu. Environment is secured. Will continue to monitor for safety. 

## 2023-01-23 NOTE — ED Notes (Signed)
Pt sleeping at present, no distress noted.  Monitoring for safety. 

## 2023-01-23 NOTE — Care Management (Addendum)
Care Management   Writer coordinated placement for the patient at [facility] [phone]).  Writer spoke to [employer] at [facility].   Per Skyler, patient has been accepted and can come to the facility today or tomorrow,    Clinical research associate informed the NP, Tanika.   11:50am  Writer coordinated a buse ticket for the patient to [facility].  The bus leaves at 9:45am.  The bus ticket is with the RN working with the patient.    The patient needs to call [place] when he is leaving to the bus ststion so that they will know to pick up the patient.  The number is [phone].   Writer informed the NP working with the patient.

## 2023-01-24 NOTE — Discharge Instructions (Signed)

## 2023-01-24 NOTE — ED Notes (Signed)
Pt sleeping at present no distress noted.  Monitoring for safety. 

## 2023-01-24 NOTE — ED Notes (Signed)
Patient alert and oriented x 3. Denies SI/HI/AVH. Denies intent or plan to harm self or others. Routine conducted according to faculty protocol. Encourage patient to notify staff with any needs or concerns. Patient verbalized agreement and understanding. Will continue to monitor for safety. 

## 2023-01-24 NOTE — ED Notes (Signed)
Night provider states to discharge patient so that he can get to the bus station to go to [facility].

## 2023-01-24 NOTE — ED Provider Notes (Signed)
Patient is being discharged this morning to catch the greyhound bus to [facility]. Patient is alert oriented x4, mood is euthymic with congruent affect and is in no apparent distress. Patient's bus ticket and taxi voucher provided to patient.

## 2023-03-17 ENCOUNTER — Other Ambulatory Visit: Payer: Self-pay

## 2023-03-17 ENCOUNTER — Ambulatory Visit (HOSPITAL_COMMUNITY)
Admission: EM | Admit: 2023-03-17 | Discharge: 2023-03-18 | Disposition: A | Discharge status: Home / Self Care | Payer: Commercial Managed Care - HMO | Attending: Psychiatry | Admitting: Psychiatry

## 2023-03-17 DIAGNOSIS — R9431 Abnormal electrocardiogram [ECG] [EKG]: Secondary | ICD-10-CM | POA: Insufficient documentation

## 2023-03-17 DIAGNOSIS — T43226A Underdosing of selective serotonin reuptake inhibitors, initial encounter: Secondary | ICD-10-CM | POA: Insufficient documentation

## 2023-03-17 DIAGNOSIS — T426X6A Underdosing of other antiepileptic and sedative-hypnotic drugs, initial encounter: Secondary | ICD-10-CM | POA: Diagnosis not present

## 2023-03-17 DIAGNOSIS — F32A Depression, unspecified: Secondary | ICD-10-CM | POA: Diagnosis present

## 2023-03-17 DIAGNOSIS — R1010 Upper abdominal pain, unspecified: Secondary | ICD-10-CM

## 2023-03-17 DIAGNOSIS — Z91128 Patient's intentional underdosing of medication regimen for other reason: Secondary | ICD-10-CM | POA: Insufficient documentation

## 2023-03-17 DIAGNOSIS — Z5900 Homelessness unspecified: Secondary | ICD-10-CM | POA: Diagnosis not present

## 2023-03-17 DIAGNOSIS — F1010 Alcohol abuse, uncomplicated: Secondary | ICD-10-CM | POA: Insufficient documentation

## 2023-03-17 DIAGNOSIS — Z560 Unemployment, unspecified: Secondary | ICD-10-CM | POA: Diagnosis not present

## 2023-03-17 DIAGNOSIS — Z91148 Patient's other noncompliance with medication regimen for other reason: Secondary | ICD-10-CM

## 2023-03-17 DIAGNOSIS — F1410 Cocaine abuse, uncomplicated: Secondary | ICD-10-CM | POA: Diagnosis not present

## 2023-03-17 LAB — CBC WITH DIFFERENTIAL/PLATELET
Abs Immature Granulocytes: 0.03 10*3/uL (ref 0.00–0.07)
Basophils Absolute: 0.1 10*3/uL (ref 0.0–0.1)
Basophils Relative: 1 %
Eosinophils Absolute: 1 10*3/uL — ABNORMAL HIGH (ref 0.0–0.5)
Eosinophils Relative: 13 %
HCT: 42.5 % (ref 39.0–52.0)
Hemoglobin: 14 g/dL (ref 13.0–17.0)
Immature Granulocytes: 0 %
Lymphocytes Relative: 37 %
Lymphs Abs: 2.8 10*3/uL (ref 0.7–4.0)
MCH: 33.2 pg (ref 26.0–34.0)
MCHC: 32.9 g/dL (ref 30.0–36.0)
MCV: 100.7 fL — ABNORMAL HIGH (ref 80.0–100.0)
Monocytes Absolute: 0.5 10*3/uL (ref 0.1–1.0)
Monocytes Relative: 7 %
Neutro Abs: 3.2 10*3/uL (ref 1.7–7.7)
Neutrophils Relative %: 42 %
Platelets: 263 10*3/uL (ref 150–400)
RBC: 4.22 MIL/uL (ref 4.22–5.81)
RDW: 12 % (ref 11.5–15.5)
WBC: 7.7 10*3/uL (ref 4.0–10.5)
nRBC: 0 % (ref 0.0–0.2)

## 2023-03-17 LAB — COMPREHENSIVE METABOLIC PANEL
ALT: 47 U/L — ABNORMAL HIGH (ref 0–44)
AST: 50 U/L — ABNORMAL HIGH (ref 15–41)
Albumin: 4.2 g/dL (ref 3.5–5.0)
Alkaline Phosphatase: 77 U/L (ref 38–126)
Anion gap: 13 (ref 5–15)
BUN: 11 mg/dL (ref 6–20)
CO2: 26 mmol/L (ref 22–32)
Calcium: 9.3 mg/dL (ref 8.9–10.3)
Chloride: 102 mmol/L (ref 98–111)
Creatinine, Ser: 1.11 mg/dL (ref 0.61–1.24)
GFR, Estimated: >60 mL/min (ref >60)
Glucose, Bld: 67 mg/dL — ABNORMAL LOW (ref 70–99)
Potassium: 4 mmol/L (ref 3.5–5.1)
Sodium: 141 mmol/L (ref 135–145)
Total Bilirubin: 0.6 mg/dL (ref 0.3–1.2)
Total Protein: 7.4 g/dL (ref 6.5–8.1)

## 2023-03-17 LAB — ETHANOL: Alcohol, Ethyl (B): <10 mg/dL (ref <10)

## 2023-03-17 LAB — POCT URINE DRUG SCREEN - MANUAL ENTRY (I-SCREEN)
POC Amphetamine UR: NOT DETECTED
POC Buprenorphine (BUP): NOT DETECTED
POC Cocaine UR: POSITIVE — AB
POC Marijuana UR: POSITIVE — AB
POC Methadone UR: NOT DETECTED
POC Methamphetamine UR: POSITIVE — AB
POC Morphine: NOT DETECTED
POC Oxazepam (BZO): NOT DETECTED
POC Oxycodone UR: NOT DETECTED
POC Secobarbital (BAR): NOT DETECTED

## 2023-03-17 LAB — TSH: TSH: 0.687 u[IU]/mL (ref 0.350–4.500)

## 2023-03-17 MED ORDER — LORAZEPAM 1 MG PO TABS: 1.0000 mg | ORAL_TABLET | ORAL | Status: DC | PRN

## 2023-03-17 MED ORDER — OLANZAPINE 10 MG PO TBDP: 10.0000 mg | ORAL_TABLET | Freq: Three times a day (TID) | ORAL | Status: DC | PRN

## 2023-03-17 MED ORDER — ALUM & MAG HYDROXIDE-SIMETH 200-200-20 MG/5ML PO SUSP: 30.0000 mL | ORAL | Status: DC | PRN

## 2023-03-17 MED ORDER — ZIPRASIDONE MESYLATE 20 MG IM SOLR: 20.0000 mg | INTRAMUSCULAR | Status: DC | PRN

## 2023-03-17 MED ORDER — MAGNESIUM HYDROXIDE 400 MG/5ML PO SUSP: 30.0000 mL | Freq: Every day | ORAL | Status: DC | PRN

## 2023-03-17 NOTE — ED Provider Notes (Signed)
[facility] Continuous Assessment Admission H&P  Date: 03/17/23 Patient Name: Calvin [name] MRN: [id number] Chief Complaint: trying to get detox from alcohol and cocaine  Diagnoses:  Final diagnoses:  Alcohol abuse  Cocaine abuse (HCC)  H/O medication noncompliance    HPI: Calvin [name], 37y/o male with a history of alcohol abuse, cocaine abuse, homelessness, presented to [facility] voluntarily.  Per the patient he is looking detox from alcohol and cocaine.  Review of patient records show multiple admission for the same problems.  Patient is also currently homeless.  According to the patient he use cocaine and alcohol within the last 24 hours.  Per the patient he is also on Prozac gabapentin but he does not take it.   Face-to-face observation, patient is alert and oriented x 4, speech is clear, maintained eye contact.  Upon entering the room patient is observed sitting in the chair patient answers questions when asked however patient is very nonchalant about his answers and at one point when asked how he tried rehab in the past patient stated that he yes and sometimes at work sometimes I do not.  Patient denies SI, HI, AVH or paranoia.  According to patient he used cocaine 1 g daily and he also drink alcohol at least a pint a day.  Patient is currently homeless and stated that he used his cousin's address.  Patient is also noncompliant with his medication stating that he does have the medicine but he does not take them.  Patient is unemployed.   Recommend inpatient observation for assessment for FBC in the a.m.   Total Time spent with patient: 20 minutes  Musculoskeletal  Strength & Muscle Tone: within normal limits Gait & Station: normal Patient leans: N/A  Psychiatric Specialty Exam  Presentation General Appearance:  Casual  Eye Contact: Good  Speech: Clear and Coherent  Speech Volume: Normal  Handedness: Right   Mood and Affect   Mood: Anxious  Affect: Appropriate   Thought Process  Thought Processes: Coherent  Descriptions of Associations:Intact  Orientation:Full (Time, Place and Person)  Thought Content:Logical  Diagnosis of Schizophrenia or Schizoaffective disorder in past: No   Hallucinations:Hallucinations: Other (comment)  Ideas of Reference:None  Suicidal Thoughts:Suicidal Thoughts: No  Homicidal Thoughts:Homicidal Thoughts: No   Sensorium  Memory: Immediate Fair  Judgment: Fair  Insight: Fair   Art therapist  Concentration: Good  Attention Span: Good  Recall: Good  Fund of Knowledge: Good  Language: Good   Psychomotor Activity  Psychomotor Activity: Psychomotor Activity: Normal   Assets  Assets: Desire for Improvement; Housing   Sleep  Sleep: Sleep: Fair Number of Hours of Sleep: 6   Nutritional Assessment (For OBS and FBC admissions only) Has the patient had a weight loss or gain of 10 pounds or more in the last 3 months?: No Has the patient had a decrease in food intake/or appetite?: No Does the patient have dental problems?: No Does the patient have eating habits or behaviors that may be indicators of an eating disorder including binging or inducing vomiting?: No Has the patient recently lost weight without trying?: 0 Has the patient been eating poorly because of a decreased appetite?: 0 Malnutrition Screening Tool Score: 0    Physical Exam HENT:     Head: Normocephalic.     Nose: Nose normal.  Eyes:     Pupils: Pupils are equal, round, and reactive to light.  Cardiovascular:     Rate and Rhythm: Normal rate.  Pulmonary:  Effort: Pulmonary effort is normal.  Musculoskeletal:        General: Normal range of motion.     Cervical back: Normal range of motion.  Neurological:     General: No focal deficit present.     Mental Status: He is alert.  Psychiatric:        Mood and Affect: Mood normal.        Behavior: Behavior  normal.        Thought Content: Thought content normal.        Judgment: Judgment normal.    Review of Systems  Constitutional: Negative.   HENT: Negative.    Eyes: Negative.   Respiratory: Negative.    Cardiovascular: Negative.   Gastrointestinal: Negative.   Genitourinary: Negative.   Musculoskeletal: Negative.   Skin: Negative.   Neurological: Negative.   Psychiatric/Behavioral:  Positive for substance abuse. The patient is nervous/anxious.     Blood pressure (!) 138/98, pulse 93, temperature 98.3 F (36.8 C), temperature source Oral, resp. rate 18, SpO2 98%. There is no height or weight on file to calculate BMI.  Past Psychiatric History: Alcohol abuse, cocaine abuse homelessness  Is the patient at risk to self? No  Has the patient been a risk to self in the past 6 months? No .    Has the patient been a risk to self within the distant past? No   Is the patient a risk to others? No   Has the patient been a risk to others in the past 6 months? No   Has the patient been a risk to others within the distant past? No   Past Medical History: See chart  Family History: Unknown  Social History: Alcohol, cocaine  Last Labs:  Admission on 01/23/2023, Discharged on 01/24/2023  Component Date Value Ref Range Status   Cholesterol 01/23/2023 126  0 - 200 mg/dL Final   Triglycerides 19/14/7829 100  <150 mg/dL Final   HDL 56/21/3086 59  >40 mg/dL Final   Total CHOL/HDL Ratio 01/23/2023 2.1  RATIO Final   VLDL 01/23/2023 20  0 - 40 mg/dL Final   LDL Cholesterol 01/23/2023 47  0 - 99 mg/dL Final   Comment:        Total Cholesterol/HDL:CHD Risk Coronary Heart Disease Risk Table                     Men   Women  1/2 Average Risk   3.4   3.3  Average Risk       5.0   4.4  2 X Average Risk   9.6   7.1  3 X Average Risk  23.4   11.0        Use the calculated Patient Ratio above and the CHD Risk Table to determine the patient's CHD Risk.        ATP III CLASSIFICATION (LDL):   <100     mg/dL   Optimal  578-469  mg/dL   Near or Above                    Optimal  130-159  mg/dL   Borderline  629-528  mg/dL   High  >413     mg/dL   Very High Performed at [facility], [address] [place], [place] [address]   Admission on 01/23/2023, Discharged on 01/23/2023  Component Date Value Ref Range Status   Alcohol, Ethyl (B) 01/23/2023 11 (H)  <10 mg/dL  Final   Comment: (NOTE) Lowest detectable limit for serum alcohol is 10 mg/dL.  For medical purposes only. Performed at [facility], [address] [place], [place] [address]    Opiates 01/23/2023 NONE DETECTED  NONE DETECTED Final   Cocaine 01/23/2023 POSITIVE (A)  NONE DETECTED Final   Benzodiazepines 01/23/2023 NONE DETECTED  NONE DETECTED Final   Amphetamines 01/23/2023 NONE DETECTED  NONE DETECTED Final   Tetrahydrocannabinol 01/23/2023 POSITIVE (A)  NONE DETECTED Final   Barbiturates 01/23/2023 NONE DETECTED  NONE DETECTED Final   Comment: (NOTE) DRUG SCREEN FOR MEDICAL PURPOSES ONLY.  IF CONFIRMATION IS NEEDED FOR ANY PURPOSE, NOTIFY LAB WITHIN 5 DAYS.  LOWEST DETECTABLE LIMITS FOR URINE DRUG SCREEN Drug Class                     Cutoff (ng/mL) Amphetamine and metabolites    1000 Barbiturate and metabolites    200 Benzodiazepine                 200 Opiates and metabolites        300 Cocaine and metabolites        300 THC                            50 Performed at [facility], [address], [place], [place] [address]   Admission on 01/23/2023, Discharged on 01/23/2023  Component Date Value Ref Range Status   Lipase 01/23/2023 36  11 - 51 U/L Final   Performed at [facility], [address], [place], [place] [address]   Sodium 01/23/2023 138  135 - 145 mmol/L Final   Potassium 01/23/2023 3.7  3.5 - 5.1 mmol/L Final   Chloride 01/23/2023 103  98 - 111 mmol/L Final   CO2 01/23/2023 27  22 - 32 mmol/L Final   Glucose, Bld 01/23/2023 88  70 - 99 mg/dL Final   Glucose  reference range applies only to samples taken after fasting for at least 8 hours.   BUN 01/23/2023 10  6 - 20 mg/dL Final   Creatinine, Ser 01/23/2023 1.08  0.61 - 1.24 mg/dL Final   Calcium 91/47/8295 9.0  8.9 - 10.3 mg/dL Final   Total Protein 62/13/0865 7.1  6.5 - 8.1 g/dL Final   Albumin 78/46/9629 3.9  3.5 - 5.0 g/dL Final   AST 52/84/1324 35  15 - 41 U/L Final   ALT 01/23/2023 31  0 - 44 U/L Final   Alkaline Phosphatase 01/23/2023 56  38 - 126 U/L Final   Total Bilirubin 01/23/2023 0.5  0.3 - 1.2 mg/dL Final   GFR, Estimated 01/23/2023 >60  >60 mL/min Final   Comment: (NOTE) Calculated using the CKD-EPI Creatinine Equation (2021)    Anion gap 01/23/2023 8  5 - 15 Final   Performed at [facility], [address] [place], [place] [address]   WBC 01/23/2023 7.2  4.0 - 10.5 K/uL Final   RBC 01/23/2023 4.02 (L)  4.22 - 5.81 MIL/uL Final   Hemoglobin 01/23/2023 13.5  13.0 - 17.0 g/dL Final   HCT 72/53/6644 41.6  39.0 - 52.0 % Final   MCV 01/23/2023 103.5 (H)  80.0 - 100.0 fL Final   MCH 01/23/2023 33.6  26.0 - 34.0 pg Final   MCHC 01/23/2023 32.5  30.0 - 36.0 g/dL Final   RDW 03/47/4259 12.5  11.5 - 15.5 % Final  Platelets 01/23/2023 226  150 - 400 K/uL Final   nRBC 01/23/2023 0.0  0.0 - 0.2 % Final   Performed at [facility], [address], [place], [place] [address]   Color, Urine 01/23/2023 YELLOW  YELLOW Final   APPearance 01/23/2023 CLEAR  CLEAR Final   Specific Gravity, Urine 01/23/2023 >1.030 (H)  1.005 - 1.030 Final   pH 01/23/2023 5.5  5.0 - 8.0 Final   Glucose, UA 01/23/2023 NEGATIVE  NEGATIVE mg/dL Final   Hgb urine dipstick 01/23/2023 TRACE (A)  NEGATIVE Final   Bilirubin Urine 01/23/2023 NEGATIVE  NEGATIVE Final   Ketones, ur 01/23/2023 NEGATIVE  NEGATIVE mg/dL Final   Protein, ur 19/14/7829 NEGATIVE  NEGATIVE mg/dL Final   Nitrite 56/21/3086 NEGATIVE  NEGATIVE Final   Leukocytes,Ua 01/23/2023 NEGATIVE  NEGATIVE Final   Performed at [facility], [address] [place], [place] [address]   RBC / HPF 01/23/2023 0-5  0 - 5 RBC/hpf Final   WBC, UA 01/23/2023 6-10  0 - 5 WBC/hpf Final   Bacteria, UA 01/23/2023 NONE SEEN  NONE SEEN Final   Squamous Epithelial / HPF 01/23/2023 0-5  0 - 5 /HPF Final   Mucus 01/23/2023 PRESENT   Final   Performed at [facility], [address] [place], [place] [address]  Admission on 10/30/2022, Discharged on 10/31/2022  Component Date Value Ref Range Status   SARS Coronavirus 2 by RT PCR 10/30/2022 NEGATIVE  NEGATIVE Final   Performed at [facility], [address] [place], [place] [address]   SARSCOV2ONAVIRUS 2 AG 10/30/2022 NEGATIVE  NEGATIVE Final   Comment: (NOTE) SARS-CoV-2 antigen NOT DETECTED.   Negative results are presumptive.  Negative results do not preclude SARS-CoV-2 infection and should not be used as the sole basis for treatment or other patient management decisions, including infection  control decisions, particularly in the presence of clinical signs and  symptoms consistent with COVID-19, or in those who have been in contact with the virus.  Negative results must be combined with clinical observations, patient history, and epidemiological information. The expected result is Negative.  Fact Sheet for Patients: https://www.jennings-kim.com/  Fact Sheet for Healthcare Providers: https://alexander-rogers.biz/  This test is not yet approved or cleared by the [place] FDA and  has been authorized for detection and/or diagnosis of SARS-CoV-2 by FDA under an Emergency Use Authorization (EUA).  This EUA will remain in effect (meaning this test can be used) for the duration of  the COV                          ID-19 declaration under Section 564(b)(1) of the Act, 21 U.S.C. section 360bbb-3(b)(1), unless the authorization is terminated or revoked sooner.    Admission on 10/26/2022, Discharged on 10/26/2022  Component Date Value Ref  Range Status   Sodium 10/26/2022 137  135 - 145 mmol/L Final   Potassium 10/26/2022 3.6  3.5 - 5.1 mmol/L Final   Chloride 10/26/2022 102  98 - 111 mmol/L Final   CO2 10/26/2022 24  22 - 32 mmol/L Final   Glucose, Bld 10/26/2022 79  70 - 99 mg/dL Final   Glucose reference range applies only to samples taken after fasting for at least 8 hours.   BUN 10/26/2022 10  6 - 20 mg/dL Final   Creatinine, Ser 10/26/2022 1.08  0.61 - 1.24 mg/dL Final   Calcium 24/40/1027 8.7 (L)  8.9 -  10.3 mg/dL Final   GFR, Estimated 10/26/2022 >60  >60 mL/min Final   Comment: (NOTE) Calculated using the CKD-EPI Creatinine Equation (2021)    Anion gap 10/26/2022 11  5 - 15 Final   Performed at [facility], [address], [place], [place] [address]   WBC 10/26/2022 6.5  4.0 - 10.5 K/uL Final   RBC 10/26/2022 3.91 (L)  4.22 - 5.81 MIL/uL Final   Hemoglobin 10/26/2022 13.6  13.0 - 17.0 g/dL Final   HCT 60/45/4098 39.7  39.0 - 52.0 % Final   MCV 10/26/2022 101.5 (H)  80.0 - 100.0 fL Final   MCH 10/26/2022 34.8 (H)  26.0 - 34.0 pg Final   MCHC 10/26/2022 34.3  30.0 - 36.0 g/dL Final   RDW 11/91/4782 12.6  11.5 - 15.5 % Final   Platelets 10/26/2022 204  150 - 400 K/uL Final   nRBC 10/26/2022 0.0  0.0 - 0.2 % Final   Neutrophils Relative % 10/26/2022 70  % Final   Neutro Abs 10/26/2022 4.5  1.7 - 7.7 K/uL Final   Lymphocytes Relative 10/26/2022 18  % Final   Lymphs Abs 10/26/2022 1.2  0.7 - 4.0 K/uL Final   Monocytes Relative 10/26/2022 9  % Final   Monocytes Absolute 10/26/2022 0.6  0.1 - 1.0 K/uL Final   Eosinophils Relative 10/26/2022 3  % Final   Eosinophils Absolute 10/26/2022 0.2  0.0 - 0.5 K/uL Final   Basophils Relative 10/26/2022 0  % Final   Basophils Absolute 10/26/2022 0.0  0.0 - 0.1 K/uL Final   Immature Granulocytes 10/26/2022 0  % Final   Abs Immature Granulocytes 10/26/2022 0.02  0.00 - 0.07 K/uL Final   Performed at [facility], [address], [place], [place] [address]   Group  A Strep by PCR 10/26/2022 DETECTED (A)  NOT DETECTED Final   Performed at [facility], [address], [place], [place] [address]   SARS Coronavirus 2 by RT PCR 10/26/2022 NEGATIVE  NEGATIVE Final   Influenza A by PCR 10/26/2022 NEGATIVE  NEGATIVE Final   Influenza B by PCR 10/26/2022 NEGATIVE  NEGATIVE Final   Comment: (NOTE) The Xpert Xpress SARS-CoV-2/FLU/RSV plus assay is intended as an aid in the diagnosis of influenza from Nasopharyngeal swab specimens and should not be used as a sole basis for treatment. Nasal washings and aspirates are unacceptable for Xpert Xpress SARS-CoV-2/FLU/RSV testing.  Fact Sheet for Patients: BloggerCourse.com  Fact Sheet for Healthcare Providers: SeriousBroker.it  This test is not yet approved or cleared by the [place] FDA and has been authorized for detection and/or diagnosis of SARS-CoV-2 by FDA under an Emergency Use Authorization (EUA). This EUA will remain in effect (meaning this test can be used) for the duration of the COVID-19 declaration under Section 564(b)(1) of the Act, 21 U.S.C. section 360bbb-3(b)(1), unless the authorization is terminated or revoked.     Resp Syncytial Virus by PCR 10/26/2022 NEGATIVE  NEGATIVE Final   Comment: (NOTE) Fact Sheet for Patients: BloggerCourse.com  Fact Sheet for Healthcare Providers: SeriousBroker.it  This test is not yet approved or cleared by the [place] FDA and has been authorized for detection and/or diagnosis of SARS-CoV-2 by FDA under an Emergency Use Authorization (EUA). This EUA will remain in effect (meaning this test can be used) for the duration of the COVID-19 declaration under Section 564(b)(1) of the Act, 21 U.S.C. section 360bbb-3(b)(1), unless the authorization is terminated or revoked.  Performed at  [facility], [address], [place],  [place] [address]   Admission on 10/22/2022, Discharged on 10/22/2022  Component Date Value Ref Range Status   Lipase 10/22/2022 26  11 - 51 U/L Final   Performed at [facility], [address], [place], [place] [address]   Sodium 10/22/2022 141  135 - 145 mmol/L Final   Potassium 10/22/2022 3.7  3.5 - 5.1 mmol/L Final   Chloride 10/22/2022 105  98 - 111 mmol/L Final   CO2 10/22/2022 20 (L)  22 - 32 mmol/L Final   Glucose, Bld 10/22/2022 68 (L)  70 - 99 mg/dL Final   Glucose reference range applies only to samples taken after fasting for at least 8 hours.   BUN 10/22/2022 16  6 - 20 mg/dL Final   Creatinine, Ser 10/22/2022 1.15  0.61 - 1.24 mg/dL Final   Calcium 40/34/7425 8.8 (L)  8.9 - 10.3 mg/dL Final   Total Protein 95/63/8756 7.4  6.5 - 8.1 g/dL Final   Albumin 43/32/9518 4.2  3.5 - 5.0 g/dL Final   AST 84/16/6063 51 (H)  15 - 41 U/L Final   ALT 10/22/2022 44  0 - 44 U/L Final   Alkaline Phosphatase 10/22/2022 72  38 - 126 U/L Final   Total Bilirubin 10/22/2022 0.3  0.3 - 1.2 mg/dL Final   GFR, Estimated 10/22/2022 >60  >60 mL/min Final   Comment: (NOTE) Calculated using the CKD-EPI Creatinine Equation (2021)    Anion gap 10/22/2022 16 (H)  5 - 15 Final   Performed at [facility], [address] [place], [place] [address]   WBC 10/22/2022 8.0  4.0 - 10.5 K/uL Final   RBC 10/22/2022 3.94 (L)  4.22 - 5.81 MIL/uL Final   Hemoglobin 10/22/2022 13.9  13.0 - 17.0 g/dL Final   HCT 09/32/3557 41.5  39.0 - 52.0 % Final   MCV 10/22/2022 105.3 (H)  80.0 - 100.0 fL Final   MCH 10/22/2022 35.3 (H)  26.0 - 34.0 pg Final   MCHC 10/22/2022 33.5  30.0 - 36.0 g/dL Final   RDW 32/20/2542 13.1  11.5 - 15.5 % Final   Platelets 10/22/2022 235  150 - 400 K/uL Final   nRBC 10/22/2022 0.0  0.0 - 0.2 % Final   Performed at [facility], [address] [place], [place] [address]   Color, Urine 10/22/2022 YELLOW  YELLOW Final   APPearance 10/22/2022 CLEAR  CLEAR Final   Specific  Gravity, Urine 10/22/2022 1.025  1.005 - 1.030 Final   pH 10/22/2022 5.0  5.0 - 8.0 Final   Glucose, UA 10/22/2022 NEGATIVE  NEGATIVE mg/dL Final   Hgb urine dipstick 10/22/2022 MODERATE (A)  NEGATIVE Final   Bilirubin Urine 10/22/2022 NEGATIVE  NEGATIVE Final   Ketones, ur 10/22/2022 20 (A)  NEGATIVE mg/dL Final   Protein, ur 76/28/3151 NEGATIVE  NEGATIVE mg/dL Final   Nitrite 76/16/0737 NEGATIVE  NEGATIVE Final   Leukocytes,Ua 10/22/2022 SMALL (A)  NEGATIVE Final   RBC / HPF 10/22/2022 0-5  0 - 5 RBC/hpf Final   WBC, UA 10/22/2022 21-50  0 - 5 WBC/hpf Final   Bacteria, UA 10/22/2022 FEW (A)  NONE SEEN Final   Squamous Epithelial / HPF 10/22/2022 0-5  0 - 5 /HPF Final   Mucus 10/22/2022 PRESENT   Final   Performed at [facility], [address] [place], [place] [address]   Opiates 10/22/2022 NONE DETECTED  NONE DETECTED  Final   Cocaine 10/22/2022 POSITIVE (A)  NONE DETECTED Final   Benzodiazepines 10/22/2022 NONE DETECTED  NONE DETECTED Final   Amphetamines 10/22/2022 NONE DETECTED  NONE DETECTED Final   Tetrahydrocannabinol 10/22/2022 POSITIVE (A)  NONE DETECTED Final   Barbiturates 10/22/2022 NONE DETECTED  NONE DETECTED Final   Comment: (NOTE) DRUG SCREEN FOR MEDICAL PURPOSES ONLY.  IF CONFIRMATION IS NEEDED FOR ANY PURPOSE, NOTIFY LAB WITHIN 5 DAYS.  LOWEST DETECTABLE LIMITS FOR URINE DRUG SCREEN Drug Class                     Cutoff (ng/mL) Amphetamine and metabolites    1000 Barbiturate and metabolites    200 Benzodiazepine                 200 Opiates and metabolites        300 Cocaine and metabolites        300 THC                            50 Performed at [facility], [address] [place], [place] [address]    Glucose-Capillary 10/22/2022 57 (L)  70 - 99 mg/dL Final   Glucose reference range applies only to samples taken after fasting for at least 8 hours.   Glucose-Capillary 10/22/2022 83  70 - 99 mg/dL Final   Glucose reference range applies  only to samples taken after fasting for at least 8 hours.   Comment 1 10/22/2022 Document in Chart   Final  Admission on 10/17/2022, Discharged on 10/17/2022  Component Date Value Ref Range Status   WBC 10/17/2022 6.9  4.0 - 10.5 K/uL Final   RBC 10/17/2022 3.79 (L)  4.22 - 5.81 MIL/uL Final   Hemoglobin 10/17/2022 13.1  13.0 - 17.0 g/dL Final   HCT 62/13/0865 39.5  39.0 - 52.0 % Final   MCV 10/17/2022 104.2 (H)  80.0 - 100.0 fL Final   MCH 10/17/2022 34.6 (H)  26.0 - 34.0 pg Final   MCHC 10/17/2022 33.2  30.0 - 36.0 g/dL Final   RDW 78/46/9629 13.1  11.5 - 15.5 % Final   Platelets 10/17/2022 209  150 - 400 K/uL Final   nRBC 10/17/2022 0.0  0.0 - 0.2 % Final   Neutrophils Relative % 10/17/2022 62  % Final   Neutro Abs 10/17/2022 4.2  1.7 - 7.7 K/uL Final   Lymphocytes Relative 10/17/2022 28  % Final   Lymphs Abs 10/17/2022 1.9  0.7 - 4.0 K/uL Final   Monocytes Relative 10/17/2022 8  % Final   Monocytes Absolute 10/17/2022 0.6  0.1 - 1.0 K/uL Final   Eosinophils Relative 10/17/2022 2  % Final   Eosinophils Absolute 10/17/2022 0.1  0.0 - 0.5 K/uL Final   Basophils Relative 10/17/2022 0  % Final   Basophils Absolute 10/17/2022 0.0  0.0 - 0.1 K/uL Final   Immature Granulocytes 10/17/2022 0  % Final   Abs Immature Granulocytes 10/17/2022 0.01  0.00 - 0.07 K/uL Final   Performed at [facility], [address] [place], [place] [address]   Sodium 10/17/2022 136  135 - 145 mmol/L Final   Potassium 10/17/2022 3.5  3.5 - 5.1 mmol/L Final   Chloride 10/17/2022 105  98 - 111 mmol/L Final   CO2 10/17/2022 23  22 - 32 mmol/L Final   Glucose, Bld 10/17/2022 81  70 - 99 mg/dL Final  Glucose reference range applies only to samples taken after fasting for at least 8 hours.   BUN 10/17/2022 9  6 - 20 mg/dL Final   Creatinine, Ser 10/17/2022 0.88  0.61 - 1.24 mg/dL Final   Calcium 29/52/8413 8.6 (L)  8.9 - 10.3 mg/dL Final   Total Protein 24/40/1027 7.2  6.5 - 8.1 g/dL Final    Albumin 25/36/6440 4.3  3.5 - 5.0 g/dL Final   AST 34/74/2595 45 (H)  15 - 41 U/L Final   ALT 10/17/2022 70 (H)  0 - 44 U/L Final   Alkaline Phosphatase 10/17/2022 68  38 - 126 U/L Final   Total Bilirubin 10/17/2022 1.3 (H)  0.3 - 1.2 mg/dL Final   GFR, Estimated 10/17/2022 >60  >60 mL/min Final   Comment: (NOTE) Calculated using the CKD-EPI Creatinine Equation (2021)    Anion gap 10/17/2022 8  5 - 15 Final   Performed at [facility], [address] [place], [place] [address]   Lipase 10/17/2022 27  11 - 51 U/L Final   Performed at [facility], [address], [place], [place] [address]   Lactic Acid, Venous 10/17/2022 0.8  0.5 - 1.9 mmol/L Final   Performed at [facility], [address] [place], [place] [address]   Alcohol, Ethyl (B) 10/17/2022 <10  <10 mg/dL Final   Comment: (NOTE) Lowest detectable limit for serum alcohol is 10 mg/dL.  For medical purposes only. Performed at [facility], [address] [place], [place] [address]   Admission on 09/22/2022, Discharged on 09/22/2022  Component Date Value Ref Range Status   WBC 09/22/2022 10.1  4.0 - 10.5 K/uL Final   RBC 09/22/2022 3.89 (L)  4.22 - 5.81 MIL/uL Final   Hemoglobin 09/22/2022 13.6  13.0 - 17.0 g/dL Final   HCT 63/07/6008 39.9  39.0 - 52.0 % Final   MCV 09/22/2022 102.6 (H)  80.0 - 100.0 fL Final   MCH 09/22/2022 35.0 (H)  26.0 - 34.0 pg Final   MCHC 09/22/2022 34.1  30.0 - 36.0 g/dL Final   RDW 93/23/5573 12.2  11.5 - 15.5 % Final   Platelets 09/22/2022 186  150 - 400 K/uL Final   nRBC 09/22/2022 0.0  0.0 - 0.2 % Final   Neutrophils Relative % 09/22/2022 83  % Final   Neutro Abs 09/22/2022 8.3 (H)  1.7 - 7.7 K/uL Final   Lymphocytes Relative 09/22/2022 12  % Final   Lymphs Abs 09/22/2022 1.2  0.7 - 4.0 K/uL Final   Monocytes Relative 09/22/2022 5  % Final   Monocytes Absolute 09/22/2022 0.5  0.1 - 1.0 K/uL Final   Eosinophils  Relative 09/22/2022 0  % Final   Eosinophils Absolute 09/22/2022 0.0  0.0 - 0.5 K/uL Final   Basophils Relative 09/22/2022 0  % Final   Basophils Absolute 09/22/2022 0.0  0.0 - 0.1 K/uL Final   Immature Granulocytes 09/22/2022 0  % Final   Abs Immature Granulocytes 09/22/2022 0.04  0.00 - 0.07 K/uL Final   Performed at [facility], [address], [place], [place] [address]   Sodium 09/22/2022 135  135 - 145 mmol/L Final   Potassium 09/22/2022 4.2  3.5 - 5.1 mmol/L Final   Chloride 09/22/2022 98  98 - 111 mmol/L Final   CO2 09/22/2022 22  22 - 32 mmol/L Final   Glucose, Bld 09/22/2022 155 (H)  70 - 99 mg/dL Final   Glucose  reference range applies only to samples taken after fasting for at least 8 hours.   BUN 09/22/2022 16  6 - 20 mg/dL Final   Creatinine, Ser 09/22/2022 1.23  0.61 - 1.24 mg/dL Final   Calcium 96/78/9381 8.5 (L)  8.9 - 10.3 mg/dL Final   Total Protein 01/75/1025 7.0  6.5 - 8.1 g/dL Final   Albumin 85/27/7824 4.1  3.5 - 5.0 g/dL Final   AST 23/53/6144 39  15 - 41 U/L Final   ALT 09/22/2022 20  0 - 44 U/L Final   Alkaline Phosphatase 09/22/2022 66  38 - 126 U/L Final   Total Bilirubin 09/22/2022 0.6  0.3 - 1.2 mg/dL Final   GFR, Estimated 09/22/2022 >60  >60 mL/min Final   Comment: (NOTE) Calculated using the CKD-EPI Creatinine Equation (2021)    Anion gap 09/22/2022 15  5 - 15 Final   Performed at [facility], [address] [place], [place] [address]   Alcohol, Ethyl (B) 09/22/2022 69 (H)  <10 mg/dL Final   Comment: (NOTE) Lowest detectable limit for serum alcohol is 10 mg/dL.  For medical purposes only. Performed at [facility], [address], [place], [place] [address]    Opiates 09/22/2022 NONE DETECTED  NONE DETECTED Final   Cocaine 09/22/2022 POSITIVE (A)  NONE DETECTED Final   Benzodiazepines 09/22/2022 NONE DETECTED  NONE DETECTED Final   Amphetamines 09/22/2022 POSITIVE (A)  NONE DETECTED Final   Tetrahydrocannabinol 09/22/2022  POSITIVE (A)  NONE DETECTED Final   Barbiturates 09/22/2022 NONE DETECTED  NONE DETECTED Final   Comment: (NOTE) DRUG SCREEN FOR MEDICAL PURPOSES ONLY.  IF CONFIRMATION IS NEEDED FOR ANY PURPOSE, NOTIFY LAB WITHIN 5 DAYS.  LOWEST DETECTABLE LIMITS FOR URINE DRUG SCREEN Drug Class                     Cutoff (ng/mL) Amphetamine and metabolites    1000 Barbiturate and metabolites    200 Benzodiazepine                 200 Opiates and metabolites        300 Cocaine and metabolites        300 THC                            50 Performed at [facility], [address] [place], [place] [address]    Acetaminophen (Tylenol), Serum 09/22/2022 <10 (L)  10 - 30 ug/mL Final   Comment: (NOTE) Therapeutic concentrations vary significantly. A range of 10-30 ug/mL  may be an effective concentration for many patients. However, some  are best treated at concentrations outside of this range. Acetaminophen concentrations >150 ug/mL at 4 hours after ingestion  and >50 ug/mL at 12 hours after ingestion are often associated with  toxic reactions.  Performed at [facility], [address], [place], [place] [address]    Salicylate Lvl 09/22/2022 <7.0 (L)  7.0 - 30.0 mg/dL Final   Performed at [facility], [address], [place], [place] [address]   SARS Coronavirus 2 by RT PCR 09/22/2022 NEGATIVE  NEGATIVE Final   Influenza A by PCR 09/22/2022 NEGATIVE  NEGATIVE Final   Influenza B by PCR 09/22/2022 NEGATIVE  NEGATIVE Final   Comment: (NOTE) The Xpert Xpress SARS-CoV-2/FLU/RSV plus assay is intended as an aid in the diagnosis of influenza from Nasopharyngeal swab specimens and should  not be used as a sole basis for treatment. Nasal washings and aspirates are unacceptable for Xpert Xpress SARS-CoV-2/FLU/RSV testing.  Fact Sheet for Patients: BloggerCourse.com  Fact Sheet for Healthcare Providers: SeriousBroker.it  This test  is not yet approved or cleared by the [place] FDA and has been authorized for detection and/or diagnosis of SARS-CoV-2 by FDA under an Emergency Use Authorization (EUA). This EUA will remain in effect (meaning this test can be used) for the duration of the COVID-19 declaration under Section 564(b)(1) of the Act, 21 U.S.C. section 360bbb-3(b)(1), unless the authorization is terminated or revoked.     Resp Syncytial Virus by PCR 09/22/2022 NEGATIVE  NEGATIVE Final   Comment: (NOTE) Fact Sheet for Patients: BloggerCourse.com  Fact Sheet for Healthcare Providers: SeriousBroker.it  This test is not yet approved or cleared by the [place] FDA and has been authorized for detection and/or diagnosis of SARS-CoV-2 by FDA under an Emergency Use Authorization (EUA). This EUA will remain in effect (meaning this test can be used) for the duration of the COVID-19 declaration under Section 564(b)(1) of the Act, 21 U.S.C. section 360bbb-3(b)(1), unless the authorization is terminated or revoked.  Performed at [facility], [address], [place], [place] [address]   Admission on 09/18/2022, Discharged on 09/20/2022  Component Date Value Ref Range Status   SARS Coronavirus 2 by RT PCR 09/18/2022 NEGATIVE  NEGATIVE Final   Influenza A by PCR 09/18/2022 NEGATIVE  NEGATIVE Final   Influenza B by PCR 09/18/2022 NEGATIVE  NEGATIVE Final   Comment: (NOTE) The Xpert Xpress SARS-CoV-2/FLU/RSV plus assay is intended as an aid in the diagnosis of influenza from Nasopharyngeal swab specimens and should not be used as a sole basis for treatment. Nasal washings and aspirates are unacceptable for Xpert Xpress SARS-CoV-2/FLU/RSV testing.  Fact Sheet for Patients: BloggerCourse.com  Fact Sheet for Healthcare Providers: SeriousBroker.it  This test is not yet approved or cleared by the [place] FDA and has been authorized for detection and/or diagnosis of SARS-CoV-2 by FDA under an Emergency Use Authorization (EUA). This EUA will remain in effect (meaning this test can be used) for the duration of the COVID-19 declaration under Section 564(b)(1) of the Act, 21 U.S.C. section 360bbb-3(b)(1), unless the authorization is terminated or revoked.     Resp Syncytial Virus by PCR 09/18/2022 NEGATIVE  NEGATIVE Final   Comment: (NOTE) Fact Sheet for Patients: BloggerCourse.com  Fact Sheet for Healthcare Providers: SeriousBroker.it  This test is not yet approved or cleared by the [place] FDA and has been authorized for detection and/or diagnosis of SARS-CoV-2 by FDA under an Emergency Use Authorization (EUA). This EUA will remain in effect (meaning this test can be used) for the duration of the COVID-19 declaration under Section 564(b)(1) of the Act, 21 U.S.C. section 360bbb-3(b)(1), unless the authorization is terminated or revoked.  Performed at [facility], [address], [place], [place] [address]    WBC 09/18/2022 6.7  4.0 - 10.5 K/uL Final   RBC 09/18/2022 4.42  4.22 - 5.81 MIL/uL Final   Hemoglobin 09/18/2022 15.4  13.0 - 17.0 g/dL Final   HCT 76/73/4193 44.6  39.0 - 52.0 % Final   MCV 09/18/2022 100.9 (H)  80.0 - 100.0 fL Final   MCH 09/18/2022 34.8 (H)  26.0 - 34.0 pg Final   MCHC 09/18/2022 34.5  30.0 - 36.0 g/dL Final   RDW 79/08/4095 11.9  11.5 - 15.5 % Final   Platelets 09/18/2022 219  150 - 400 K/uL Final   nRBC 09/18/2022 0.0  0.0 - 0.2 % Final   Neutrophils Relative % 09/18/2022 53  % Final   Neutro Abs 09/18/2022 3.5  1.7 - 7.7 K/uL Final   Lymphocytes Relative 09/18/2022 39  % Final   Lymphs Abs 09/18/2022 2.6  0.7 - 4.0 K/uL Final   Monocytes Relative 09/18/2022 7  % Final   Monocytes Absolute 09/18/2022 0.5  0.1 - 1.0 K/uL Final   Eosinophils Relative 09/18/2022 1  % Final    Eosinophils Absolute 09/18/2022 0.1  0.0 - 0.5 K/uL Final   Basophils Relative 09/18/2022 0  % Final   Basophils Absolute 09/18/2022 0.0  0.0 - 0.1 K/uL Final   Immature Granulocytes 09/18/2022 0  % Final   Abs Immature Granulocytes 09/18/2022 0.01  0.00 - 0.07 K/uL Final   Performed at [facility], [address] [place], [place] [address]   Sodium 09/18/2022 139  135 - 145 mmol/L Final   Potassium 09/18/2022 4.0  3.5 - 5.1 mmol/L Final   Chloride 09/18/2022 99  98 - 111 mmol/L Final   CO2 09/18/2022 27  22 - 32 mmol/L Final   Glucose, Bld 09/18/2022 62 (L)  70 - 99 mg/dL Final   Glucose reference range applies only to samples taken after fasting for at least 8 hours.   BUN 09/18/2022 9  6 - 20 mg/dL Final   Creatinine, Ser 09/18/2022 0.97  0.61 - 1.24 mg/dL Final   Calcium 60/45/4098 9.4  8.9 - 10.3 mg/dL Final   Total Protein 11/91/4782 8.2 (H)  6.5 - 8.1 g/dL Final   Albumin 95/62/1308 4.4  3.5 - 5.0 g/dL Final   AST 65/78/4696 34  15 - 41 U/L Final   ALT 09/18/2022 23  0 - 44 U/L Final   Alkaline Phosphatase 09/18/2022 67  38 - 126 U/L Final   Total Bilirubin 09/18/2022 0.6  0.3 - 1.2 mg/dL Final   GFR, Estimated 09/18/2022 >60  >60 mL/min Final   Comment: (NOTE) Calculated using the CKD-EPI Creatinine Equation (2021)    Anion gap 09/18/2022 13  5 - 15 Final   Performed at [facility], [address] [place], [place] [address]   Hgb A1c MFr Bld 09/18/2022 4.9  4.8 - 5.6 % Final   Comment: (NOTE)         Prediabetes: 5.7 - 6.4         Diabetes: >6.4         Glycemic control for adults with diabetes: <7.0    Mean Plasma Glucose 09/18/2022 94  mg/dL Final   Comment: (NOTE) Performed At: [facility] [address] [place], [place] [id number] Jolene Schimke MD UV:[id number]    Cholesterol 09/18/2022 135  0 - 200 mg/dL Final   Triglycerides 74/25/9563 72  <150 mg/dL Final   HDL 87/56/4332 72  >40 mg/dL Final   Total CHOL/HDL Ratio 09/18/2022 1.9   RATIO Final   VLDL 09/18/2022 14  0 - 40 mg/dL Final   LDL Cholesterol 09/18/2022 49  0 - 99 mg/dL Final   Comment:        Total Cholesterol/HDL:CHD Risk Coronary Heart Disease Risk Table                     Men   Women  1/2 Average Risk   3.4   3.3  Average Risk       5.0   4.4  2 X Average Risk   9.6   7.1  3 X Average Risk  23.4   11.0        Use the calculated Patient Ratio above and the CHD Risk Table to determine the patient's CHD Risk.        ATP III CLASSIFICATION (LDL):  <100     mg/dL   Optimal  132-440  mg/dL   Near or Above                    Optimal  130-159  mg/dL   Borderline  102-725  mg/dL   High  >366     mg/dL   Very High Performed at [facility], [address] [place], [place] [address]    TSH 09/18/2022 1.267  0.350 - 4.500 uIU/mL Final   Comment: Performed by a 3rd Generation assay with a functional sensitivity of <=0.01 uIU/mL. Performed at [facility], [address], [place], [place] [address]    POC Amphetamine UR 09/18/2022 None Detected  NONE DETECTED (Cut Off Level 1000 ng/mL) Preliminary   POC Secobarbital (BAR) 09/18/2022 None Detected  NONE DETECTED (Cut Off Level 300 ng/mL) Preliminary   POC Buprenorphine (BUP) 09/18/2022 None Detected  NONE DETECTED (Cut Off Level 10 ng/mL) Preliminary   POC Oxazepam (BZO) 09/18/2022 None Detected  NONE DETECTED (Cut Off Level 300 ng/mL) Preliminary   POC Cocaine UR 09/18/2022 None Detected  NONE DETECTED (Cut Off Level 300 ng/mL) Preliminary   POC Methamphetamine UR 09/18/2022 Positive (A)  NONE DETECTED (Cut Off Level 1000 ng/mL) Preliminary   POC Morphine 09/18/2022 None Detected  NONE DETECTED (Cut Off Level 300 ng/mL) Preliminary   POC Methadone UR 09/18/2022 None Detected  NONE DETECTED (Cut Off Level 300 ng/mL) Preliminary   POC Oxycodone UR 09/18/2022 Positive (A)  NONE DETECTED (Cut Off Level 100 ng/mL) Preliminary   POC Marijuana UR 09/18/2022 Positive (A)  NONE DETECTED (Cut Off Level  50 ng/mL) Preliminary   SARSCOV2ONAVIRUS 2 AG 09/18/2022 NEGATIVE  NEGATIVE Final   Comment: (NOTE) SARS-CoV-2 antigen NOT DETECTED.   Negative results are presumptive.  Negative results do not preclude SARS-CoV-2 infection and should not be used as the sole basis for treatment or other patient management decisions, including infection  control decisions, particularly in the presence of clinical signs and  symptoms consistent with COVID-19, or in those who have been in contact with the virus.  Negative results must be combined with clinical observations, patient history, and epidemiological information. The expected result is Negative.  Fact Sheet for Patients: https://www.jennings-kim.com/  Fact Sheet for Healthcare Providers: https://alexander-rogers.biz/  This test is not yet approved or cleared by the [place] FDA and  has been authorized for detection and/or diagnosis of SARS-CoV-2 by FDA under an Emergency Use Authorization (EUA).  This EUA will remain in effect (meaning this test can be used) for the duration of  the COV                          ID-19 declaration under Section 564(b)(1) of the Act, 21 U.S.C. section 360bbb-3(b)(1), unless the authorization is terminated or revoked sooner.      Allergies: Tylenol [acetaminophen]  Medications:     Medical Decision Making  Inpatient observation  Lab Orders         CBC with Differential/Platelet         Comprehensive metabolic panel  Ethanol         TSH         POCT Urine Drug Screen - (I-Screen)       Meds ordered this encounter  Medications   alum & mag hydroxide-simeth (MAALOX/MYLANTA) 200-200-20 MG/5ML suspension 30 mL   magnesium hydroxide (MILK OF MAGNESIA) suspension 30 mL   AND Linked Order Group    OLANZapine zydis (ZYPREXA) disintegrating tablet 10 mg    LORazepam (ATIVAN) tablet 1 mg    ziprasidone (GEODON) injection 20 mg    Recommendations  Based on my  evaluation the patient does not appear to have an emergency medical condition.  Sindy Guadeloupe, NP 03/17/23  9:24 PM

## 2023-03-17 NOTE — ED Notes (Signed)
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  Denies SI or AVH.  Thninks about hurting orther with no plan or intent.  Monitoring for safety.

## 2023-03-17 NOTE — ED Notes (Signed)
Patient is sleeping. Respirations equal and unlabored, skin warm and dry. No change in assessment or acuity. Routine safety checks conducted according to facility protocol. Will continue to monitor for safety.   

## 2023-03-17 NOTE — Progress Notes (Signed)
   03/17/23 1954  [facility] Triage Screening (Walk-ins at [facility] only)  How Did You Hear About [place]? Self  What Is the Reason for Your Visit/Call Today? Pt presents to [facility] voluntarily, unaccompanied with complaint of depression and relapsing on drugs. 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 Pt last used cocaine and a few pints of liquor last night. Pt feels sadness and hopelessness and feels that everyone is against him. Pt reports feeling like someone wants to hurt him and in turn he thinks about hurting others as well, but he has no plan or intent. Pt denies SI,AVH.  How Long Has This Been Causing You Problems? > than 6 months  Have You Recently Had Any Thoughts About Hurting Yourself? No  Are You Planning to Commit Suicide/Harm Yourself At This time? No  Have you Recently Had Thoughts About Hurting Someone [name]? Yes  How long ago did you have thoughts of harming others? earlier today  Are You Planning To Harm Someone At This Time? No  Are you currently experiencing any auditory, visual or other hallucinations? No  Please explain the hallucinations you are currently experiencing: pt denies  Have You Used Any Alcohol or Drugs in the Past 24 Hours? Yes  How long ago did you use Drugs or Alcohol? last night  What Did You Use and How Much? cocaine and 2-3 pints of alcohol (unknown amount)  Do you have any current medical co-morbidities that require immediate attention? No  Clinician description of patient physical appearance/behavior: pt is calm, cooperative  What Do You Feel Would Help You the Most Today? Alcohol or Drug Use Treatment;Treatment for Depression or other mood problem  If access to [facility] was not available, would you have sought care in the Emergency Department? No  Determination of Need Urgent (48 hours)  Options For Referral Other: Comment;Facility-Based Crisis;Chemical Dependency Intensive Outpatient Therapy (CDIOP)

## 2023-03-18 DIAGNOSIS — F1010 Alcohol abuse, uncomplicated: Secondary | ICD-10-CM | POA: Diagnosis not present

## 2023-03-18 DIAGNOSIS — F1410 Cocaine abuse, uncomplicated: Secondary | ICD-10-CM | POA: Diagnosis not present

## 2023-03-18 DIAGNOSIS — Z91148 Patient's other noncompliance with medication regimen for other reason: Secondary | ICD-10-CM | POA: Diagnosis not present

## 2023-03-18 NOTE — ED Notes (Signed)
Patient observed/assessed in bed/chair resting quietly appearing in no distress and verbalizing no complaints at this time. Will continue to monitor.  
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
## 2023-03-18 NOTE — ED Notes (Signed)
This nurse contacted safe transport to get an updated ETA on when they would be here to pick up patient, they stated it would be later this afternoon due to having to take a patient to [place]

## 2023-03-18 NOTE — ED Notes (Signed)
Pt sleeping at present, no distress noted.  Monitoring for safety. 

## 2023-03-18 NOTE — ED Notes (Signed)
Pt is currently sleeping, no distress noted, environmental check complete, will continue to monitor patient for safety.  
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
## 2023-03-18 NOTE — ED Notes (Signed)
Patient is lying in bed quietly, no distress noted, will continue to monitor patient for safety. 

## 2023-03-18 NOTE — ED Notes (Signed)
Pt was provided breakfast.

## 2023-03-18 NOTE — Care Management (Addendum)
OBS Care Management   Writer met with patient an discussed [employer]. Writer provided patient with the phone number so that he can call and complete an intake screening assessment.    Writer informed the NP and the RN.   11:57am  Patient completed the phone screening with [facility] and he has been accepted.  The RN will coordinate transportation with [facility].    Writer informed the NP working with the patient.

## 2023-03-18 NOTE — ED Notes (Signed)
[facility] has been called to transport patient to [facility]. Per [facility] they have another long distance transport and they will be here later this afternoon to pick up patient.

## 2023-03-18 NOTE — Discharge Instructions (Addendum)

## 2023-03-18 NOTE — ED Notes (Signed)
 Pt was provided lunch

## 2023-03-19 ENCOUNTER — Emergency Department (HOSPITAL_COMMUNITY)
Admission: EM | Admit: 2023-03-19 | Discharge: 2023-03-20 | Disposition: A | Discharge status: Home / Self Care | Payer: Commercial Managed Care - HMO | Attending: Emergency Medicine | Admitting: Emergency Medicine

## 2023-03-19 ENCOUNTER — Encounter (HOSPITAL_COMMUNITY): Payer: Self-pay

## 2023-03-19 ENCOUNTER — Other Ambulatory Visit: Payer: Self-pay

## 2023-03-19 DIAGNOSIS — F1410 Cocaine abuse, uncomplicated: Secondary | ICD-10-CM | POA: Insufficient documentation

## 2023-03-19 DIAGNOSIS — F1910 Other psychoactive substance abuse, uncomplicated: Secondary | ICD-10-CM

## 2023-03-19 DIAGNOSIS — J45909 Unspecified asthma, uncomplicated: Secondary | ICD-10-CM | POA: Diagnosis not present

## 2023-03-19 LAB — COMPREHENSIVE METABOLIC PANEL
ALT: 40 U/L (ref 0–44)
AST: 40 U/L (ref 15–41)
Albumin: 4.5 g/dL (ref 3.5–5.0)
Alkaline Phosphatase: 68 U/L (ref 38–126)
Anion gap: 12 (ref 5–15)
BUN: 11 mg/dL (ref 6–20)
CO2: 25 mmol/L (ref 22–32)
Calcium: 9.3 mg/dL (ref 8.9–10.3)
Chloride: 103 mmol/L (ref 98–111)
Creatinine, Ser: 1.16 mg/dL (ref 0.61–1.24)
GFR, Estimated: >60 mL/min (ref >60)
Glucose, Bld: 99 mg/dL (ref 70–99)
Potassium: 3.5 mmol/L (ref 3.5–5.1)
Sodium: 140 mmol/L (ref 135–145)
Total Bilirubin: 0.6 mg/dL (ref 0.3–1.2)
Total Protein: 8 g/dL (ref 6.5–8.1)

## 2023-03-19 LAB — CBC WITH DIFFERENTIAL/PLATELET
Abs Immature Granulocytes: 0.02 10*3/uL (ref 0.00–0.07)
Basophils Absolute: 0 10*3/uL (ref 0.0–0.1)
Basophils Relative: 1 %
Eosinophils Absolute: 0.3 10*3/uL (ref 0.0–0.5)
Eosinophils Relative: 5 %
HCT: 42.7 % (ref 39.0–52.0)
Hemoglobin: 14.3 g/dL (ref 13.0–17.0)
Immature Granulocytes: 0 %
Lymphocytes Relative: 29 %
Lymphs Abs: 2 10*3/uL (ref 0.7–4.0)
MCH: 34 pg (ref 26.0–34.0)
MCHC: 33.5 g/dL (ref 30.0–36.0)
MCV: 101.7 fL — ABNORMAL HIGH (ref 80.0–100.0)
Monocytes Absolute: 0.6 10*3/uL (ref 0.1–1.0)
Monocytes Relative: 9 %
Neutro Abs: 3.7 10*3/uL (ref 1.7–7.7)
Neutrophils Relative %: 56 %
Platelets: 249 10*3/uL (ref 150–400)
RBC: 4.2 MIL/uL — ABNORMAL LOW (ref 4.22–5.81)
RDW: 11.9 % (ref 11.5–15.5)
WBC: 6.6 10*3/uL (ref 4.0–10.5)
nRBC: 0 % (ref 0.0–0.2)

## 2023-03-19 LAB — ETHANOL: Alcohol, Ethyl (B): <10 mg/dL (ref <10)

## 2023-03-19 LAB — MAGNESIUM: Magnesium: 2 mg/dL (ref 1.7–2.4)

## 2023-03-19 NOTE — ED Provider Triage Note (Signed)
Emergency Medicine Provider Triage Evaluation Note  Calvin [name] , a 37 y.o. male  was evaluated in triage.  Pt complains of feeling weak.  Reports ingestion of multiple drugs.  He states he has history of seizure couple months ago which was his first ever seizure.  He states he felt similar.  He would like to have some lab work done to ensure everything is okay..  Review of Systems  Positive: As above Negative: As above  Physical Exam  BP (!) 133/91 (BP Location: Left Arm)   Pulse 98   Temp 98.6 F (37 C) (Oral)   Resp 16   Ht 6\' 1"  (1.854 m)   Wt 77.1 kg   SpO2 98%   BMI 22.43 kg/m  Gen:   Awake, no distress   Resp:  Normal effort  MSK:   Moves extremities without difficulty  Other:    Medical Decision Making  Medically screening exam initiated at 10:00 PM.  Appropriate orders placed.  [name] was informed that the remainder of the evaluation will be completed by another provider, this initial triage assessment does not replace that evaluation, and the importance of remaining in the ED until their evaluation is complete.    Marita Kansas, PA-C 03/19/23 2202

## 2023-03-19 NOTE — ED Notes (Signed)
Security present to escort patient to the waiting room after refusing several times.

## 2023-03-19 NOTE — ED Triage Notes (Signed)
Arrives [facility] from home after using cocaine, alcohol, shrooms and ectasy.   Now feels dizzy and like he may have a seizure.

## 2023-03-19 NOTE — ED Notes (Signed)
Save blue tube in main lab °
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
## 2023-03-25 ENCOUNTER — Emergency Department (HOSPITAL_COMMUNITY): Payer: Commercial Managed Care - HMO

## 2023-03-25 ENCOUNTER — Encounter (HOSPITAL_COMMUNITY): Payer: Self-pay

## 2023-03-25 ENCOUNTER — Emergency Department (HOSPITAL_COMMUNITY)
Admission: EM | Admit: 2023-03-25 | Discharge: 2023-03-25 | Disposition: A | Discharge status: Home / Self Care | Payer: Commercial Managed Care - HMO | Attending: Emergency Medicine | Admitting: Emergency Medicine

## 2023-03-25 ENCOUNTER — Other Ambulatory Visit: Payer: Self-pay

## 2023-03-25 DIAGNOSIS — R1084 Generalized abdominal pain: Secondary | ICD-10-CM | POA: Diagnosis not present

## 2023-03-25 DIAGNOSIS — F29 Unspecified psychosis not due to a substance or known physiological condition: Secondary | ICD-10-CM | POA: Insufficient documentation

## 2023-03-25 DIAGNOSIS — Z9149 Other personal history of psychological trauma, not elsewhere classified: Secondary | ICD-10-CM | POA: Diagnosis not present

## 2023-03-25 DIAGNOSIS — F1590 Other stimulant use, unspecified, uncomplicated: Secondary | ICD-10-CM | POA: Diagnosis not present

## 2023-03-25 DIAGNOSIS — R112 Nausea with vomiting, unspecified: Secondary | ICD-10-CM | POA: Insufficient documentation

## 2023-03-25 DIAGNOSIS — R109 Unspecified abdominal pain: Secondary | ICD-10-CM | POA: Diagnosis present

## 2023-03-25 DIAGNOSIS — F1090 Alcohol use, unspecified, uncomplicated: Secondary | ICD-10-CM | POA: Insufficient documentation

## 2023-03-25 DIAGNOSIS — F32A Depression, unspecified: Secondary | ICD-10-CM | POA: Diagnosis not present

## 2023-03-25 DIAGNOSIS — R197 Diarrhea, unspecified: Secondary | ICD-10-CM | POA: Insufficient documentation

## 2023-03-25 LAB — COMPREHENSIVE METABOLIC PANEL
ALT: 34 U/L (ref 0–44)
AST: 30 U/L (ref 15–41)
Albumin: 4.2 g/dL (ref 3.5–5.0)
Alkaline Phosphatase: 69 U/L (ref 38–126)
Anion gap: 8 (ref 5–15)
BUN: 13 mg/dL (ref 6–20)
CO2: 25 mmol/L (ref 22–32)
Calcium: 8.7 mg/dL — ABNORMAL LOW (ref 8.9–10.3)
Chloride: 102 mmol/L (ref 98–111)
Creatinine, Ser: 1.12 mg/dL (ref 0.61–1.24)
GFR, Estimated: >60 mL/min (ref >60)
Glucose, Bld: 108 mg/dL — ABNORMAL HIGH (ref 70–99)
Potassium: 3.8 mmol/L (ref 3.5–5.1)
Sodium: 135 mmol/L (ref 135–145)
Total Bilirubin: 0.6 mg/dL (ref 0.3–1.2)
Total Protein: 7.9 g/dL (ref 6.5–8.1)

## 2023-03-25 LAB — URINALYSIS, ROUTINE W REFLEX MICROSCOPIC
Bacteria, UA: NONE SEEN
Bilirubin Urine: NEGATIVE
Glucose, UA: NEGATIVE mg/dL
Ketones, ur: NEGATIVE mg/dL
Nitrite: NEGATIVE
Protein, ur: 30 mg/dL — AB
Specific Gravity, Urine: 1.024 (ref 1.005–1.030)
pH: 5 (ref 5.0–8.0)

## 2023-03-25 LAB — CBC
HCT: 42.5 % (ref 39.0–52.0)
Hemoglobin: 14.2 g/dL (ref 13.0–17.0)
MCH: 34.1 pg — ABNORMAL HIGH (ref 26.0–34.0)
MCHC: 33.4 g/dL (ref 30.0–36.0)
MCV: 101.9 fL — ABNORMAL HIGH (ref 80.0–100.0)
Platelets: 233 10*3/uL (ref 150–400)
RBC: 4.17 MIL/uL — ABNORMAL LOW (ref 4.22–5.81)
RDW: 12 % (ref 11.5–15.5)
WBC: 5.4 10*3/uL (ref 4.0–10.5)
nRBC: 0 % (ref 0.0–0.2)

## 2023-03-25 LAB — LIPASE, BLOOD: Lipase: 29 U/L (ref 11–51)
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
## 2023-03-25 NOTE — ED Triage Notes (Signed)
Pt c/o lower abdominal pain, vomiting & diarrhea that started yesterday. Pt describes the pain as sharp. Pt reports pain has been intermittent for the past week.

## 2023-03-25 NOTE — BH Assessment (Signed)
[facility] to complete TTS consultant. TTS coordinator will reach out via secure chat with provider and time of assessment.

## 2023-03-25 NOTE — Discharge Instructions (Signed)
You can go to the [facility] behavioral health urgent care for other evaluation of the psychiatric component.

## 2023-03-25 NOTE — ED Provider Notes (Signed)
  Physical Exam  BP (!) 134/91   Pulse (!) 101   Temp 98.3 F (36.8 C) (Oral)   Resp 18   Ht 6\' 1"  (1.854 m)   Wt 74.4 kg   SpO2 97%   BMI 21.64 kg/m   Physical Exam  Procedures  Procedures  ED Course / MDM    Medical Decision Making Amount and/or Complexity of Data Reviewed Labs: ordered. Radiology: ordered.   Discussed with telepsychiatry.  She believes the patient may be malingering but she cleared the patient for discharge.  States he is just going to go to another behavioral health provider.  Given resources for the behavioral health urgent care, however he has seen them previously so should be aware of them.  Will discharge.       Benjiman Core, MD 03/25/23 [phone]

## 2023-03-26 ENCOUNTER — Ambulatory Visit (HOSPITAL_COMMUNITY)
Admission: EM | Admit: 2023-03-26 | Discharge: 2023-03-26 | Disposition: A | Discharge status: Home / Self Care | Payer: Commercial Managed Care - HMO

## 2023-03-26 DIAGNOSIS — F1410 Cocaine abuse, uncomplicated: Secondary | ICD-10-CM | POA: Diagnosis not present

## 2023-03-26 DIAGNOSIS — F1290 Cannabis use, unspecified, uncomplicated: Secondary | ICD-10-CM

## 2023-03-26 NOTE — ED Notes (Signed)
Pt discharged from facility by provider. [employer] called to assist with removing patient from premises as he refused to leave the building despite security assistance. Once [employer] arrived he was handcuffed and physically removed by police officers as he continued to be resistant. He was given AVS and his belongings. Patient was alert, oriented and in no physical distress at discharge. He denied avh shi or plan.

## 2023-03-26 NOTE — Progress Notes (Signed)
   03/26/23 0921  [facility] Triage Screening (Walk-ins at [facility] only)  What Is the Reason for Your Visit/Call Today? Pt presents to [facility] voluntarily via [employer]. Pt states that he needs detox from drugs and alcohol. Pt reports drinking an unknown amount of alcohol (mixing brown & white liquor) from last night until this morning. Pt states that he has been using drugs since the age of 57 and his drugs of choice are marijuana, cocaine and random pills. Pt shared that [facility] [place], however he decided no to go. Pt states that when he is sober, he feels like he is high. Pt states that he needs a change in places and people in order to kick his addiction to drugs and alcohol. Pt endorses SI at this current moment and states he doesn't care if he dies or not. Pt denies HI at the present moment. Pt also endorses AVH, saying that he hears voices of other people and sees illusions that he is unable to describe.  How Long Has This Been Causing You Problems? > than 6 months  Have You Recently Had Any Thoughts About Hurting Yourself? Yes  How long ago did you have thoughts about hurting yourself? today  Are You Planning to Commit Suicide/Harm Yourself At This time? No  Have you Recently Had Thoughts About Hurting Someone [name]? No  Are You Planning To Harm Someone At This Time? No  Are you currently experiencing any auditory, visual or other hallucinations? Yes  Please explain the hallucinations you are currently experiencing: hearing voices of different people and sees illusions that he is unable to describe  Have You Used Any Alcohol or Drugs in the Past 24 Hours? Yes  How long ago did you use Drugs or Alcohol? last night until this morning  What Did You Use and How Much? unknown amount - mixing brown & white liquor  Do you have any current medical co-morbidities that require immediate attention? No  Clinician description of patient physical appearance/behavior: casually dressed,  paranoid, cooperative  What Do You Feel Would Help You the Most Today? Alcohol or Drug Use Treatment  If access to [facility] was not available, would you have sought care in the Emergency Department? Yes  Determination of Need Emergent (2 hours)  Options For Referral Chemical Dependency Intensive Outpatient Therapy (CDIOP);Inpatient Hospitalization;Facility-Based Crisis

## 2023-03-26 NOTE — Discharge Instructions (Addendum)
Patient is instructed prior to discharge to:  Take all medications as prescribed by his/her mental healthcare provider. Report any adverse effects and or reactions from the medicines to his/her outpatient provider promptly. Keep all scheduled appointments, to ensure that you are getting refills on time and to avoid any interruption in your medication.  If you are unable to keep an appointment call to reschedule.  Be sure to follow-up with resources and follow-up appointments provided.  Patient has been instructed & cautioned: To not engage in alcohol and or illegal drug use while on prescription medicines. In the event of worsening symptoms, patient is instructed to call the crisis hotline, 911 and or go to the nearest ED for appropriate evaluation and treatment of symptoms. To follow-up with his/her primary care provider for your other medical issues, concerns and or health care needs.  Information: -National Suicide Prevention Lifeline 1-800-SUICIDE or 201 103 3530.  -988 offers 24/7 access to trained crisis counselors who can help people experiencing mental health-related distress. People can call or text 988 or chat 988lifeline.org for themselves or if they are worried about a loved one who may need crisis support.     Please contact one of the following facilities to start medication management and therapy services:   [facility] [address]  [place], [place] [address] [phone]   [facility]  [address] [place], [place] [address] [phone]  [facility] [place]  [address] Vella Raring [place], [place] [address] [phone]  [facility]  [address]  [place], [place] [address] [phone]  [facility]  [address] [place], [place] [address] [phone]  [facility]  [address] Renee Rival  [place],  [place] [address] [phone]    Substance Abuse Treatment Resources listed Below:  [facility] - Admissions are currently completed Monday through Friday at 8am; both appointments and walk-ins are accepted.  Any individual that is a [facility] resident may present for a substance abuse screening and assessment for admission.  A person may be referred by numerous sources or self-refer.   Potential clients will be screened for medical necessity and appropriateness for the program.  Clients must meet criteria for high-intensity residential treatment services.  If clinically appropriate, a client will continue with the comprehensive clinical assessment and intake process, as well as enrollment in the [facility] Network.  Address: [address] [place], [place] [address] Admin Hours: Bettye Boeck to [facility] Center Hours: 24/7 Phone: [phone] Fax: [phone]  [facility] Address: [address] [place], [place], [place] [address] Behavioral Health Urgent Care [facility]) Hours: 24/7 Phone: [phone] Fax: [phone]  [facility] ([facility]): (offers outpatient therapy and intensive outpatient substance abuse therapy).  [address], [place], [place] [address] Phone: [phone]  [facility]: Offers FREE recovery skills classes, support groups, 1:1 Peer Support, and Compeer Classes. [address] [place], [place] [address] Phone: [phone] (Call to complete intake).   [facility] [address] [place], [place] [address] Phone: [phone] ext [phone] [facility] provides food, shelter and other programs and services to the homeless men of [place] through our [employer] program.  By offering safe shelter, three meals a day, clean clothing, Biblical counseling, financial planning, vocational training, GED/education and employment assistance, we've helped mend the  shattered lives of many homeless men since opening in 1974.  We have approximately 267 beds available, with a max of 312 beds including mats for emergency situations and currently house an average of 270 men a night.  Prospective Client Check-In Information Photo ID Required (State/ Out of State/ [facility]) - if photo ID is not available, clients are required to have a printout of a police/sheriff's criminal history report. Help out with chores around [facility]. No sex offender of any type (pending, charged, registered and/or any other sex related offenses) will be permitted to check in. Must be willing to abide by all rules, regulations, and policies established by the [employer]. The following will be provided - shelter, food, clothing, and biblical counseling. If you or someone you know is in need of assistance at our [facility] shelter in [place], [place], please call [phone] ext. [phone].  [facility]-will provide timely access to mental health services for children and adolescents (4-17) and adults presenting in a mental health crisis. The program is designed for those who need urgent Behavioral Health or Substance Use treatment and are not experiencing a medical crisis that would typically require an emergency room visit.    [address] [employer] [place], [place] [address] Phone: [phone] [email or web address]  [facility]: Phone#: [phone]  The [facility] SA Intensive Outpatient Program (SAIOP) means structured individual and group addiction activities and services that are provided at an outpatient program designed to assist adult and adolescent consumers to begin recovery and learn skills for recovery maintenance. The [facility] SAIOP program is offered at least 3 hours a day, 3 days a week.SAIOP services shall include a structured program consisting of, but not limited to, the following services: Individual  counseling and support; Group counseling and support; Family counseling, training or support; Biochemical assays to identify recent drug use (e.g., urine drug screens); Strategies for relapse prevention to include community and social support systems in treatment; Life skills; Crisis contingency planning; Disease Management; and Treatment support activities that have been adapted or specifically designed for persons with physical disabilities, or persons with co-occurring disorders of mental illness and substance abuse/dependence or mental retardation/developmental disability and substance abuse/dependence. Phone: [phone]  Address:   [facility] will also offer the following outpatient services: (Monday through Friday 8am-5pm)   Partial Hospitalization Program (PHP) Substance Abuse Intensive Outpatient Program (SA-IOP) Group Therapy Medication Management Peer Living Room We also provide (24/7):  Assessments: Our mental health clinician and providers will conduct a focused mental health evaluation, assessing for immediate safety concerns and further mental health needs. Referral: Our team will provide resources and help connect to community based mental health treatment, when indicated, including psychotherapy, psychiatry, and other specialized behavioral health or substance use disorder services (for those not already in treatment). Transitional Care: Our team providers in person bridging and/or telephonic follow-up during the patient's transition to outpatient services.  [facility] 24-Hour Call Center: [phone] Behavioral Health Crisis Line: [phone]
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
## 2023-03-26 NOTE — ED Notes (Addendum)
Error

## 2023-05-14 ENCOUNTER — Ambulatory Visit: Payer: Self-pay | Admitting: Nurse Practitioner

## 2023-11-28 ENCOUNTER — Ambulatory Visit (HOSPITAL_COMMUNITY)
Admission: EM | Admit: 2023-11-28 | Discharge: 2023-11-29 | Disposition: A | Discharge status: Home / Self Care | Attending: Psychiatry | Admitting: Psychiatry

## 2023-11-28 ENCOUNTER — Inpatient Hospital Stay (HOSPITAL_COMMUNITY): Admission: AD | Admit: 2023-11-28 | Payer: Self-pay | Source: Intra-hospital

## 2023-11-28 DIAGNOSIS — G4700 Insomnia, unspecified: Secondary | ICD-10-CM | POA: Insufficient documentation

## 2023-11-28 DIAGNOSIS — F1910 Other psychoactive substance abuse, uncomplicated: Secondary | ICD-10-CM

## 2023-11-28 DIAGNOSIS — F32A Depression, unspecified: Secondary | ICD-10-CM | POA: Diagnosis not present

## 2023-11-28 DIAGNOSIS — F1920 Other psychoactive substance dependence, uncomplicated: Secondary | ICD-10-CM | POA: Insufficient documentation

## 2023-11-28 LAB — URINALYSIS, ROUTINE W REFLEX MICROSCOPIC
Bacteria, UA: NONE SEEN
Bilirubin Urine: NEGATIVE
Glucose, UA: NEGATIVE mg/dL
Ketones, ur: 20 mg/dL — AB
Nitrite: NEGATIVE
Protein, ur: 100 mg/dL — AB
Specific Gravity, Urine: 1.027 (ref 1.005–1.030)
pH: 5 (ref 5.0–8.0)

## 2023-11-28 LAB — COMPREHENSIVE METABOLIC PANEL WITH GFR
ALT: 47 U/L — ABNORMAL HIGH (ref 0–44)
AST: 50 U/L — ABNORMAL HIGH (ref 15–41)
Albumin: 4.6 g/dL (ref 3.5–5.0)
Alkaline Phosphatase: 67 U/L (ref 38–126)
Anion gap: 15 (ref 5–15)
BUN: 15 mg/dL (ref 6–20)
CO2: 24 mmol/L (ref 22–32)
Calcium: 9.7 mg/dL (ref 8.9–10.3)
Chloride: 100 mmol/L (ref 98–111)
Creatinine, Ser: 1.15 mg/dL (ref 0.61–1.24)
GFR, Estimated: >60 mL/min (ref >60)
Glucose, Bld: 79 mg/dL (ref 70–99)
Potassium: 4.3 mmol/L (ref 3.5–5.1)
Sodium: 139 mmol/L (ref 135–145)
Total Bilirubin: 1.1 mg/dL (ref 0.0–1.2)
Total Protein: 8.2 g/dL — ABNORMAL HIGH (ref 6.5–8.1)

## 2023-11-28 LAB — POCT URINE DRUG SCREEN - MANUAL ENTRY (I-SCREEN)
POC Amphetamine UR: NOT DETECTED
POC Buprenorphine (BUP): NOT DETECTED
POC Cocaine UR: POSITIVE — AB
POC Marijuana UR: POSITIVE — AB
POC Methadone UR: NOT DETECTED
POC Methamphetamine UR: NOT DETECTED
POC Morphine: NOT DETECTED
POC Oxazepam (BZO): NOT DETECTED
POC Oxycodone UR: NOT DETECTED
POC Secobarbital (BAR): NOT DETECTED

## 2023-11-28 LAB — CBC WITH DIFFERENTIAL/PLATELET
Abs Immature Granulocytes: 0.05 10*3/uL (ref 0.00–0.07)
Basophils Absolute: 0 10*3/uL (ref 0.0–0.1)
Basophils Relative: 0 %
Eosinophils Absolute: 0 10*3/uL (ref 0.0–0.5)
Eosinophils Relative: 0 %
HCT: 46.3 % (ref 39.0–52.0)
Hemoglobin: 15.6 g/dL (ref 13.0–17.0)
Immature Granulocytes: 0 %
Lymphocytes Relative: 21 %
Lymphs Abs: 2.4 10*3/uL (ref 0.7–4.0)
MCH: 33.9 pg (ref 26.0–34.0)
MCHC: 33.7 g/dL (ref 30.0–36.0)
MCV: 100.7 fL — ABNORMAL HIGH (ref 80.0–100.0)
Monocytes Absolute: 0.8 10*3/uL (ref 0.1–1.0)
Monocytes Relative: 7 %
Neutro Abs: 7.9 10*3/uL — ABNORMAL HIGH (ref 1.7–7.7)
Neutrophils Relative %: 72 %
Platelets: 219 10*3/uL (ref 150–400)
RBC: 4.6 MIL/uL (ref 4.22–5.81)
RDW: 12.6 % (ref 11.5–15.5)
WBC: 11.2 10*3/uL — ABNORMAL HIGH (ref 4.0–10.5)
nRBC: 0 % (ref 0.0–0.2)

## 2023-11-28 LAB — TSH: TSH: 1.539 u[IU]/mL (ref 0.350–4.500)

## 2023-11-28 LAB — LIPID PANEL
Cholesterol: 133 mg/dL (ref 0–200)
HDL: 63 mg/dL (ref >40)
LDL Cholesterol: 57 mg/dL (ref 0–99)
Total CHOL/HDL Ratio: 2.1 ratio
Triglycerides: 63 mg/dL (ref <150)
VLDL: 13 mg/dL (ref 0–40)

## 2023-11-28 LAB — ETHANOL: Alcohol, Ethyl (B): <15 mg/dL (ref <15)

## 2023-11-28 LAB — MAGNESIUM: Magnesium: 2.2 mg/dL (ref 1.7–2.4)

## 2023-11-28 MED ORDER — DIPHENHYDRAMINE HCL 50 MG/ML IJ SOLN: 50.0000 mg | Freq: Three times a day (TID) | INTRAMUSCULAR | Status: DC | PRN

## 2023-11-28 MED ORDER — HALOPERIDOL 5 MG PO TABS: 5.0000 mg | ORAL_TABLET | Freq: Three times a day (TID) | ORAL | Status: DC | PRN

## 2023-11-28 MED ORDER — LORAZEPAM 2 MG/ML IJ SOLN: 2.0000 mg | Freq: Three times a day (TID) | INTRAMUSCULAR | Status: DC | PRN

## 2023-11-28 MED ORDER — ALUM & MAG HYDROXIDE-SIMETH 200-200-20 MG/5ML PO SUSP: 30.0000 mL | ORAL | Status: DC | PRN

## 2023-11-28 MED ORDER — THIAMINE MONONITRATE 100 MG PO TABS
100.0000 mg | ORAL_TABLET | Freq: Every day | ORAL | Status: DC
  Administered 2023-11-29: 100 mg via ORAL
  Filled 2023-11-28: qty 1

## 2023-11-28 MED ORDER — HALOPERIDOL LACTATE 5 MG/ML IJ SOLN: 5.0000 mg | Freq: Three times a day (TID) | INTRAMUSCULAR | Status: DC | PRN

## 2023-11-28 MED ORDER — ADULT MULTIVITAMIN W/MINERALS CH
1.0000 | ORAL_TABLET | Freq: Every day | ORAL | Status: DC
  Administered 2023-11-29: 1 via ORAL
  Filled 2023-11-28: qty 1

## 2023-11-28 MED ORDER — THIAMINE HCL 100 MG/ML IJ SOLN: 100.0000 mg | Freq: Once | INTRAMUSCULAR | Status: DC

## 2023-11-28 MED ORDER — TRAZODONE HCL 50 MG PO TABS
50.0000 mg | ORAL_TABLET | Freq: Every evening | ORAL | Status: DC | PRN
  Administered 2023-11-28: 50 mg via ORAL
  Filled 2023-11-28: qty 1

## 2023-11-28 MED ORDER — HYDROXYZINE HCL 25 MG PO TABS
25.0000 mg | ORAL_TABLET | Freq: Four times a day (QID) | ORAL | Status: DC | PRN
  Administered 2023-11-28: 25 mg via ORAL
  Filled 2023-11-28: qty 1

## 2023-11-28 MED ORDER — MAGNESIUM HYDROXIDE 400 MG/5ML PO SUSP: 30.0000 mL | Freq: Every day | ORAL | Status: DC | PRN

## 2023-11-28 MED ORDER — LORAZEPAM 1 MG PO TABS: 1.0000 mg | ORAL_TABLET | Freq: Four times a day (QID) | ORAL | Status: DC | PRN

## 2023-11-28 MED ORDER — LOPERAMIDE HCL 2 MG PO CAPS: 2.0000 mg | ORAL_CAPSULE | ORAL | Status: DC | PRN

## 2023-11-28 MED ORDER — ONDANSETRON 4 MG PO TBDP: 4.0000 mg | ORAL_TABLET | Freq: Four times a day (QID) | ORAL | Status: DC | PRN

## 2023-11-28 MED ORDER — DIPHENHYDRAMINE HCL 50 MG PO CAPS: 50.0000 mg | ORAL_CAPSULE | Freq: Three times a day (TID) | ORAL | Status: DC | PRN

## 2023-11-28 MED ORDER — HALOPERIDOL LACTATE 5 MG/ML IJ SOLN: 10.0000 mg | Freq: Three times a day (TID) | INTRAMUSCULAR | Status: DC | PRN
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
## 2023-11-29 LAB — RPR: RPR Ser Ql: NONREACTIVE

## 2023-11-29 NOTE — ED Notes (Signed)
 Alert and Oriented x 4.  Stable. Pt signed AMA papers and ultimately was discharged to home/self care. All belongings returned to pt.  Pt made aware of options for F/U noted on AVS.  Denied SI/HI and A/V hallucinations

## 2023-11-29 NOTE — ED Notes (Signed)
 Pt is requesting to be discharged.  Providers made aware.  Plan of care ongoing

## 2023-11-29 NOTE — Discharge Instructions (Addendum)
 You are encouraged to follow up with [facility] for outpatient treatment.  Walk in/ Open Access Hours: Monday - Friday 8AM - 11AM (To see provider and therapist) Friday - 1PM - 4PM (To see therapist only)  [facility] [address] [place], [place] [phone]

## 2023-11-29 NOTE — ED Provider Notes (Addendum)
 FBC/OBS ASAP Discharge Summary  Date and Time: 11/29/2023 11:42 AM Name: Calvin [name] MRN:  [id number]  Subjective:  "Im okay"  Diagnosis:  Final diagnoses:  Polysubstance abuse (HCC)  Addiction (HCC)   Calvin [name] 38 y.o., male patient presented to [facility] as a voluntary walk in unaccompanied with complaint of "I need to get this sh*t out of my system". Patient self reports he has a history of polysubstance abuse, depression, anxiety, bipolar.  Calvin [name], is seen face to face by this provider, consulted with Dr. Docia Freeman; and chart reviewed on 11/29/23.  On evaluation KIJUAN FULLMER reports that he is still seeking detox services for ETOH and crack cocaine abuse. Patient reports that he is taking medications as prescribed and he is not currently experiencing any withdrawal symptoms. Patient denies SI,HI and AVH. Patient awaiting space in Facility Based Crisis for admission.   Patient has changed his mind and decided he no longer wants to stay for inpatient detox. This Clinical research associate has conferred with Dr. Docia Freeman. Although the patient is a poly substance abuser, he is not a danger to himself or others. The patient will sign AMA and be discharged.  Total Time spent with patient: 15 minutes  Past Psychiatric History: MDD, Bipolar, and ADHD Past Medical History: No pertinent past medical history Family History: Family history of substance abuse Family Psychiatric  History: No pertinent family psychiatric history  Social History: Poly substance abuse  Additional Social History:    Pain Medications: See MAR Prescriptions: See MAR Over the Counter: See MAR History of alcohol / drug use?: Yes Longest period of sobriety (when/how long): 7 years when incarcerated Negative Consequences of Use: Personal relationships (N/A) Withdrawal Symptoms: None Name of Substance 1: Cocaine 1 - Age of First Use: 14 1 - Amount (size/oz): 2 grams 1 - Frequency: daily 1 - Duration: ongoing 1 - Last  Use / Amount: used this morning/ 2 grams 1 - Method of Aquiring: purchase 1- Route of Use: snorting Name of Substance 2: alcohol 2 - Age of First Use: 24 2 - Amount (size/oz): 2-3 pints 2 - Frequency: daily 2 - Duration: Ongoing 2 - Last Use / Amount: This morning 2 - Method of Aquiring: purchase 2 - Route of Substance Use: drink Name of Substance 3: marijuana 3 - Age of First Use: 13 3 - Amount (size/oz): 2 blunts 3 - Frequency: daily 3 - Duration: ongoing 3 - Last Use / Amount: this morning 3 - Method of Aquiring: purchase 3 - Route of Substance Use: smokinh              Sleep: Good  Appetite:  Good  Current Medications:  Current Facility-Administered Medications  Medication Dose Route Frequency Provider Last Rate Last Admin   alum & mag hydroxide-simeth (MAALOX/MYLANTA) 200-200-20 MG/5ML suspension 30 mL  30 mL Oral Q4H PRN Lorrene Rosser, Veronique M, NP       haloperidol (HALDOL) tablet 5 mg  5 mg Oral TID PRN Byungura, Veronique M, NP       And   diphenhydrAMINE (BENADRYL) capsule 50 mg  50 mg Oral TID PRN Byungura, Veronique M, NP       haloperidol lactate (HALDOL) injection 5 mg  5 mg Intramuscular TID PRN Byungura, Veronique M, NP       And   diphenhydrAMINE (BENADRYL) injection 50 mg  50 mg Intramuscular TID PRN Gilman Lade, NP       And   LORazepam  (ATIVAN ) injection 2  mg  2 mg Intramuscular TID PRN Byungura, Veronique M, NP       haloperidol lactate (HALDOL) injection 10 mg  10 mg Intramuscular TID PRN Byungura, Veronique M, NP       And   diphenhydrAMINE (BENADRYL) injection 50 mg  50 mg Intramuscular TID PRN Gilman Lade, NP       And   LORazepam  (ATIVAN ) injection 2 mg  2 mg Intramuscular TID PRN Byungura, Veronique M, NP       hydrOXYzine  (ATARAX ) tablet 25 mg  25 mg Oral Q6H PRN Gilman Lade, NP   25 mg at 11/28/23 2224   loperamide  (IMODIUM ) capsule 2-4 mg  2-4 mg Oral PRN Byungura, Veronique M, NP       LORazepam  (ATIVAN )  tablet 1 mg  1 mg Oral Q6H PRN Lorrene Rosser, Veronique M, NP       magnesium  hydroxide (MILK OF MAGNESIA) suspension 30 mL  30 mL Oral Daily PRN Gilman Lade, NP       multivitamin with minerals tablet 1 tablet  1 tablet Oral Daily Byungura, Veronique M, NP   1 tablet at 11/29/23 1003   ondansetron  (ZOFRAN -ODT) disintegrating tablet 4 mg  4 mg Oral Q6H PRN Gilman Lade, NP       thiamine  (VITAMIN B1) injection 100 mg  100 mg Intramuscular Once Byungura, Veronique M, NP       thiamine  (VITAMIN B1) tablet 100 mg  100 mg Oral Daily Byungura, Veronique M, NP   100 mg at 11/29/23 1003   traZODone  (DESYREL ) tablet 50 mg  50 mg Oral QHS PRN Byungura, Veronique M, NP   50 mg at 11/28/23 2223   Current Outpatient Medications  Medication Sig Dispense Refill   albuterol  (VENTOLIN  HFA) 108 (90 Base) MCG/ACT inhaler Inhale 2 puffs into the lungs every 4 (four) hours as needed for shortness of breath or wheezing.      Labs  Lab Results:  Admission on 11/28/2023  Component Date Value Ref Range Status   WBC 11/28/2023 11.2 (H)  4.0 - 10.5 K/uL Final   RBC 11/28/2023 4.60  4.22 - 5.81 MIL/uL Final   Hemoglobin 11/28/2023 15.6  13.0 - 17.0 g/dL Final   HCT 16/04/9603 46.3  39.0 - 52.0 % Final   MCV 11/28/2023 100.7 (H)  80.0 - 100.0 fL Final   MCH 11/28/2023 33.9  26.0 - 34.0 pg Final   MCHC 11/28/2023 33.7  30.0 - 36.0 g/dL Final   RDW 54/03/8118 12.6  11.5 - 15.5 % Final   Platelets 11/28/2023 219  150 - 400 K/uL Final   nRBC 11/28/2023 0.0  0.0 - 0.2 % Final   Neutrophils Relative % 11/28/2023 72  % Final   Neutro Abs 11/28/2023 7.9 (H)  1.7 - 7.7 K/uL Final   Lymphocytes Relative 11/28/2023 21  % Final   Lymphs Abs 11/28/2023 2.4  0.7 - 4.0 K/uL Final   Monocytes Relative 11/28/2023 7  % Final   Monocytes Absolute 11/28/2023 0.8  0.1 - 1.0 K/uL Final   Eosinophils Relative 11/28/2023 0  % Final   Eosinophils Absolute 11/28/2023 0.0  0.0 - 0.5 K/uL Final   Basophils Relative  11/28/2023 0  % Final   Basophils Absolute 11/28/2023 0.0  0.0 - 0.1 K/uL Final   Immature Granulocytes 11/28/2023 0  % Final   Abs Immature Granulocytes 11/28/2023 0.05  0.00 - 0.07 K/uL Final   Performed at [facility], [address], [place], [place] [address]   Sodium 11/28/2023 139  135 - 145 mmol/L Final   Potassium 11/28/2023 4.3  3.5 - 5.1 mmol/L Final   Chloride 11/28/2023 100  98 - 111 mmol/L Final   CO2 11/28/2023 24  22 - 32 mmol/L Final   Glucose, Bld 11/28/2023 79  70 - 99 mg/dL Final   Glucose reference range applies only to samples taken after fasting for at least 8 hours.   BUN 11/28/2023 15  6 - 20 mg/dL Final   Creatinine, Ser 11/28/2023 1.15  0.61 - 1.24 mg/dL Final   Calcium 60/45/4098 9.7  8.9 - 10.3 mg/dL Final   Total Protein 11/91/4782 8.2 (H)  6.5 - 8.1 g/dL Final   Albumin 95/62/1308 4.6  3.5 - 5.0 g/dL Final   AST 65/78/4696 50 (H)  15 - 41 U/L Final   ALT 11/28/2023 47 (H)  0 - 44 U/L Final   Alkaline Phosphatase 11/28/2023 67  38 - 126 U/L Final   Total Bilirubin 11/28/2023 1.1  0.0 - 1.2 mg/dL Final   GFR, Estimated 11/28/2023 >60  >60 mL/min Final   Comment: (NOTE) Calculated using the CKD-EPI Creatinine Equation (2021)    Anion gap 11/28/2023 15  5 - 15 Final   Performed at [facility], [address], [place], [place] [address]   Magnesium  11/28/2023 2.2  1.7 - 2.4 mg/dL Final   Performed at [facility], [address] [place], [place] [address]   Alcohol, Ethyl (B) 11/28/2023 <15  <15 mg/dL Final   Comment: Please note change in reference range. (NOTE) For medical purposes only. Performed at [facility], [address], [place], [place] [address]    Cholesterol 11/28/2023 133  0 - 200 mg/dL Final   Triglycerides 72/53/6644 63  <150 mg/dL Final   HDL 03/47/4259 63  >40 mg/dL Final   Total CHOL/HDL Ratio 11/28/2023 2.1  RATIO Final   VLDL 11/28/2023 13  0 - 40 mg/dL Final   LDL Cholesterol 11/28/2023 57  0 - 99  mg/dL Final   Comment:        Total Cholesterol/HDL:CHD Risk Coronary Heart Disease Risk Table                     Men   Women  1/2 Average Risk   3.4   3.3  Average Risk       5.0   4.4  2 X Average Risk   9.6   7.1  3 X Average Risk  23.4   11.0        Use the calculated Patient Ratio above and the CHD Risk Table to determine the patient's CHD Risk.        ATP III CLASSIFICATION (LDL):  <100     mg/dL   Optimal  563-875  mg/dL   Near or Above                    Optimal  130-159  mg/dL   Borderline  643-329  mg/dL   High  >518     mg/dL   Very High Performed at [facility], [address], [place], [place] [address]    TSH 11/28/2023 1.539  0.350 - 4.500 uIU/mL Final   Comment: Performed by a 3rd Generation assay with a functional sensitivity of <=0.01 uIU/mL. Performed at [facility], [address], [place], [place] [address]  RPR Ser Ql 11/28/2023 NON REACTIVE  NON REACTIVE Final   Performed at [facility], [address], [place], [place] [address]   Color, Urine 11/28/2023 YELLOW  YELLOW Final   APPearance 11/28/2023 HAZY (A)  CLEAR Final   Specific Gravity, Urine 11/28/2023 1.027  1.005 - 1.030 Final   pH 11/28/2023 5.0  5.0 - 8.0 Final   Glucose, UA 11/28/2023 NEGATIVE  NEGATIVE mg/dL Final   Hgb urine dipstick 11/28/2023 SMALL (A)  NEGATIVE Final   Bilirubin Urine 11/28/2023 NEGATIVE  NEGATIVE Final   Ketones, ur 11/28/2023 20 (A)  NEGATIVE mg/dL Final   Protein, ur 78/29/5621 100 (A)  NEGATIVE mg/dL Final   Nitrite 30/86/5784 NEGATIVE  NEGATIVE Final   Leukocytes,Ua 11/28/2023 TRACE (A)  NEGATIVE Final   RBC / HPF 11/28/2023 0-5  0 - 5 RBC/hpf Final   WBC, UA 11/28/2023 11-20  0 - 5 WBC/hpf Final   Bacteria, UA 11/28/2023 NONE SEEN  NONE SEEN Final   Squamous Epithelial / HPF 11/28/2023 0-5  0 - 5 /HPF Final   Mucus 11/28/2023 PRESENT   Final   Performed at [facility], [address] [place], [place] [address]   POC Amphetamine  UR 11/28/2023 None Detected   Final   POC Secobarbital (BAR) 11/28/2023 None Detected   Final   POC Buprenorphine (BUP) 11/28/2023 None Detected   Final   POC Oxazepam (BZO) 11/28/2023 None Detected   Final   POC Cocaine UR 11/28/2023 Positive (A)   Final   POC Methamphetamine UR 11/28/2023 None Detected   Final   POC Morphine  11/28/2023 None Detected   Final   POC Methadone UR 11/28/2023 None Detected   Final   POC Oxycodone  UR 11/28/2023 None Detected   Final   POC Marijuana UR 11/28/2023 Positive (A)   Final    Blood Alcohol level:  Lab Results  Component Value Date   [facility] <15 11/28/2023   ETH <10 03/19/2023    Metabolic Disorder Labs: Lab Results  Component Value Date   HGBA1C 4.9 09/18/2022   MPG 94 09/18/2022   MPG 94 07/05/2022   No results found for: "PROLACTIN" Lab Results  Component Value Date   CHOL 133 11/28/2023   TRIG 63 11/28/2023   HDL 63 11/28/2023   CHOLHDL 2.1 11/28/2023   VLDL 13 11/28/2023   LDLCALC 57 11/28/2023   LDLCALC 47 01/23/2023    Therapeutic Lab Levels: No results found for: "LITHIUM" No results found for: "VALPROATE" No results found for: "CBMZ"  Physical Findings   PHQ2-9    Flowsheet Row ED from 11/28/2023 in [facility] ED from 10/30/2022 in [facility] ED from 09/22/2022 in [facility] ED from 09/18/2022 in [facility] ED from 11/05/2021 in [facility]  PHQ-2 Total Score 2 5 1 3 2   PHQ-9 Total Score 12 15 2 12 13       Flowsheet Row ED from 11/28/2023 in [facility] ED from 03/26/2023 in [facility] ED from 03/25/2023 in [facility] Emergency Department at [facility]  C-SSRS RISK CATEGORY No Risk Moderate Risk Error: Q3, 4, or 5 should not be populated when Q2 is No        Musculoskeletal  Strength & Muscle Tone: within  normal limits Gait & Station: normal Patient leans: N/A  Psychiatric Specialty Exam  Presentation  General  Appearance:  Disheveled  Eye Contact: Minimal  Speech: Clear and Coherent  Speech Volume: Normal  Handedness: Right   Mood and Affect  Mood: Depressed  Affect: Congruent   Thought Process  Thought Processes: Coherent  Descriptions of Associations:Intact  Orientation:Full (Time, Place and Person)  Thought Content:WDL  Diagnosis of Schizophrenia or Schizoaffective disorder in past: No  Duration of Psychotic Symptoms: Less than six months   Hallucinations:Hallucinations: None  Ideas of Reference:None  Suicidal Thoughts:Suicidal Thoughts: No  Homicidal Thoughts:Homicidal Thoughts: No   Sensorium  Memory: Immediate Fair  Judgment: Fair  Insight: Fair   Executive Functions  Concentration: Good  Attention Span: Good  Recall: Fair  Fund of Knowledge: Good  Language: Fair   Psychomotor Activity  Psychomotor Activity: Psychomotor Activity: Normal   Assets  Assets: Communication Skills; Desire for Improvement   Sleep  Sleep: Sleep: Good Number of Hours of Sleep: 8   Nutritional Assessment (For OBS and FBC admissions only) Has the patient had a weight loss or gain of 10 pounds or more in the last 3 months?: No Has the patient had a decrease in food intake/or appetite?: Yes Does the patient have dental problems?: No Does the patient have eating habits or behaviors that may be indicators of an eating disorder including binging or inducing vomiting?: No Has the patient recently lost weight without trying?: 0 Has the patient been eating poorly because of a decreased appetite?: 1 Malnutrition Screening Tool Score: 1    Physical Exam  Physical Exam HENT:     Head: Normocephalic.     Nose: Nose normal.     Mouth/Throat:     Pharynx: Oropharynx is clear.  Eyes:     Extraocular Movements: Extraocular movements intact.   Pulmonary:     Effort: Pulmonary effort is normal.  Musculoskeletal:        General: Normal range of motion.     Cervical back: Normal range of motion.  Skin:    General: Skin is dry.  Neurological:     Mental Status: He is alert.    Review of Systems  Constitutional: Negative.   HENT: Negative.    Eyes: Negative.   Respiratory: Negative.    Cardiovascular: Negative.   Gastrointestinal: Negative.   Genitourinary: Negative.   Musculoskeletal: Negative.   Skin: Negative.   Neurological: Negative.   Psychiatric/Behavioral:  Positive for depression and substance abuse. The patient has insomnia.    Blood pressure 115/75, pulse 85, temperature 98.4 F (36.9 C), temperature source Oral, resp. rate 18, SpO2 97%. There is no height or weight on file to calculate BMI.  Treatment Plan Summary: Daily contact with patient to assess and evaluate symptoms and progress in treatment, Medication management, and Plan to admit the patient to facility based crisis when a space becomes available. Patient has changed his mind about seeking inpatient services and has signed AMA to discharge. Patient has been encouraged to follow up with [facility] for outpatient treatment.  Walk in/ Open Access Hours: Monday - Friday 8AM - 11AM (To see provider and therapist) Friday - 1PM - 4PM (To see therapist only)  [facility] [address] [place], [place] [phone]  Patti Border, NP 11/29/2023 11:42 AM

## 2023-11-29 NOTE — ED Notes (Signed)
 Pt observed OOB to BR and for meals.  Behavior appropriate.  No observations or reports of W/D noted.  Hourly observations continue

## 2023-11-29 NOTE — ED Notes (Signed)
 Pt observed lying in bed. Eyes closed. Respirations even and non labored.  NAD

## 2023-11-29 NOTE — ED Notes (Addendum)
 Pt signed AMA papers. Provider aware

## 2023-11-29 NOTE — ED Notes (Addendum)
 Pt presented lying in recliner.  Calm upon approach.  Reports relapsing as soon as he came back to [place]. From treatment in [place] .  Pt reported that being in [place] is a trigger and hope to go back to [place] . Pt denied ETOH withdrawal symptoms.  CIWA score 0. Denied current SI plan and intent.  Denied HI and A/V hallucinations

## 2023-11-30 LAB — HEMOGLOBIN A1C
Hgb A1c MFr Bld: 4.8 % (ref 4.8–5.6)
Mean Plasma Glucose: 91 mg/dL
